# Patient Record
Sex: Female | Born: 1965 | Race: White | Hispanic: No | Marital: Married | Smoking: Former smoker
Health system: Southern US, Community
[De-identification: ages and names within clinical notes are randomized; demographics above are authoritative.]

## PROBLEM LIST (undated history)

## (undated) DIAGNOSIS — I10 Essential (primary) hypertension: Secondary | ICD-10-CM

## (undated) DIAGNOSIS — K219 Gastro-esophageal reflux disease without esophagitis: Secondary | ICD-10-CM

## (undated) DIAGNOSIS — F17200 Nicotine dependence, unspecified, uncomplicated: Secondary | ICD-10-CM

## (undated) DIAGNOSIS — T7840XA Allergy, unspecified, initial encounter: Secondary | ICD-10-CM

## (undated) DIAGNOSIS — K635 Polyp of colon: Secondary | ICD-10-CM

## (undated) DIAGNOSIS — E785 Hyperlipidemia, unspecified: Secondary | ICD-10-CM

## (undated) DIAGNOSIS — F32A Depression, unspecified: Secondary | ICD-10-CM

## (undated) DIAGNOSIS — F329 Major depressive disorder, single episode, unspecified: Secondary | ICD-10-CM

## (undated) HISTORY — DX: Major depressive disorder, single episode, unspecified: F32.9

## (undated) HISTORY — DX: Depression, unspecified: F32.A

## (undated) HISTORY — PX: GANGLION CYST EXCISION: SHX1691

## (undated) HISTORY — PX: BUNIONECTOMY: SHX129

## (undated) HISTORY — DX: Essential (primary) hypertension: I10

## (undated) HISTORY — DX: Hyperlipidemia, unspecified: E78.5

## (undated) HISTORY — DX: Polyp of colon: K63.5

## (undated) HISTORY — DX: Gastro-esophageal reflux disease without esophagitis: K21.9

## (undated) HISTORY — DX: Nicotine dependence, unspecified, uncomplicated: F17.200

## (undated) HISTORY — DX: Allergy, unspecified, initial encounter: T78.40XA

---

## 2003-06-29 ENCOUNTER — Inpatient Hospital Stay (HOSPITAL_COMMUNITY): Admission: AD | Admit: 2003-06-29 | Discharge: 2003-07-05 | Payer: Self-pay | Admitting: Obstetrics & Gynecology

## 2003-07-02 ENCOUNTER — Encounter (INDEPENDENT_AMBULATORY_CARE_PROVIDER_SITE_OTHER): Payer: Self-pay | Admitting: Specialist

## 2003-08-02 ENCOUNTER — Other Ambulatory Visit: Admission: RE | Admit: 2003-08-02 | Discharge: 2003-08-02 | Payer: Self-pay | Admitting: Obstetrics and Gynecology

## 2004-08-06 ENCOUNTER — Other Ambulatory Visit: Admission: RE | Admit: 2004-08-06 | Discharge: 2004-08-06 | Payer: Self-pay | Admitting: Obstetrics and Gynecology

## 2006-04-25 ENCOUNTER — Ambulatory Visit: Payer: Self-pay | Admitting: Family Medicine

## 2006-06-19 ENCOUNTER — Ambulatory Visit: Payer: Self-pay | Admitting: Family Medicine

## 2007-06-29 ENCOUNTER — Ambulatory Visit: Payer: Self-pay | Admitting: Family Medicine

## 2007-10-12 ENCOUNTER — Ambulatory Visit: Payer: Self-pay | Admitting: Family Medicine

## 2007-10-16 ENCOUNTER — Encounter: Admission: RE | Admit: 2007-10-16 | Discharge: 2007-10-16 | Payer: Self-pay | Admitting: Family Medicine

## 2008-04-25 ENCOUNTER — Ambulatory Visit: Payer: Self-pay | Admitting: Family Medicine

## 2008-04-28 ENCOUNTER — Encounter: Admission: RE | Admit: 2008-04-28 | Discharge: 2008-04-28 | Payer: Self-pay | Admitting: Family Medicine

## 2008-07-22 HISTORY — PX: INCONTINENCE SURGERY: SHX676

## 2008-07-22 HISTORY — PX: ABDOMINAL HYSTERECTOMY: SHX81

## 2008-07-22 HISTORY — PX: TOTAL VAGINAL HYSTERECTOMY: SHX2548

## 2008-11-16 ENCOUNTER — Ambulatory Visit: Payer: Self-pay | Admitting: Family Medicine

## 2008-12-14 ENCOUNTER — Inpatient Hospital Stay (HOSPITAL_COMMUNITY): Admission: RE | Admit: 2008-12-14 | Discharge: 2008-12-15 | Payer: Self-pay | Admitting: Obstetrics and Gynecology

## 2008-12-14 ENCOUNTER — Encounter (INDEPENDENT_AMBULATORY_CARE_PROVIDER_SITE_OTHER): Payer: Self-pay | Admitting: Obstetrics and Gynecology

## 2008-12-18 ENCOUNTER — Inpatient Hospital Stay (HOSPITAL_COMMUNITY): Admission: AD | Admit: 2008-12-18 | Discharge: 2008-12-18 | Payer: Self-pay | Admitting: Obstetrics & Gynecology

## 2009-11-20 ENCOUNTER — Ambulatory Visit: Payer: Self-pay | Admitting: Family Medicine

## 2009-11-23 ENCOUNTER — Encounter: Admission: RE | Admit: 2009-11-23 | Discharge: 2009-11-23 | Payer: Self-pay | Admitting: Family Medicine

## 2009-11-23 LAB — HM PAP SMEAR: HM Pap smear: NORMAL

## 2009-12-04 ENCOUNTER — Encounter: Admission: RE | Admit: 2009-12-04 | Discharge: 2009-12-04 | Payer: Self-pay | Admitting: Obstetrics and Gynecology

## 2010-02-01 ENCOUNTER — Ambulatory Visit: Payer: Self-pay | Admitting: Family Medicine

## 2010-06-07 ENCOUNTER — Ambulatory Visit: Payer: Self-pay | Admitting: Physician Assistant

## 2010-08-06 ENCOUNTER — Ambulatory Visit
Admission: RE | Admit: 2010-08-06 | Discharge: 2010-08-06 | Payer: Self-pay | Source: Home / Self Care | Attending: Family Medicine | Admitting: Family Medicine

## 2010-08-12 ENCOUNTER — Encounter: Payer: Self-pay | Admitting: Obstetrics and Gynecology

## 2010-09-03 ENCOUNTER — Ambulatory Visit: Payer: Self-pay | Admitting: Family Medicine

## 2010-10-30 LAB — COMPREHENSIVE METABOLIC PANEL
ALT: 13 U/L (ref 0–35)
AST: 26 U/L (ref 0–37)
Albumin: 3.2 g/dL — ABNORMAL LOW (ref 3.5–5.2)
Alkaline Phosphatase: 49 U/L (ref 39–117)
BUN: 5 mg/dL — ABNORMAL LOW (ref 6–23)
CO2: 24 mEq/L (ref 19–32)
Calcium: 8.8 mg/dL (ref 8.4–10.5)
Chloride: 109 mEq/L (ref 96–112)
Creatinine, Ser: 0.79 mg/dL (ref 0.4–1.2)
GFR calc Af Amer: >60 mL/min (ref >60)
GFR calc non Af Amer: >60 mL/min (ref >60)
Glucose, Bld: 94 mg/dL (ref 70–99)
Potassium: 3.8 mEq/L (ref 3.5–5.1)
Sodium: 140 mEq/L (ref 135–145)
Total Bilirubin: 0.9 mg/dL (ref 0.3–1.2)
Total Protein: 5.6 g/dL — ABNORMAL LOW (ref 6.0–8.3)

## 2010-10-30 LAB — PREGNANCY, URINE: Preg Test, Ur: NEGATIVE

## 2010-10-30 LAB — BASIC METABOLIC PANEL
BUN: 8 mg/dL (ref 6–23)
CO2: 28 mEq/L (ref 19–32)
Calcium: 9.1 mg/dL (ref 8.4–10.5)
Chloride: 104 mEq/L (ref 96–112)
Creatinine, Ser: 0.87 mg/dL (ref 0.4–1.2)
GFR calc Af Amer: >60 mL/min (ref >60)
GFR calc non Af Amer: >60 mL/min (ref >60)
Glucose, Bld: 90 mg/dL (ref 70–99)
Potassium: 4 mEq/L (ref 3.5–5.1)
Sodium: 137 mEq/L (ref 135–145)

## 2010-10-30 LAB — CBC
HCT: 36.6 % (ref 36.0–46.0)
HCT: 39.9 % (ref 36.0–46.0)
HCT: 43.6 % (ref 36.0–46.0)
Hemoglobin: 12.8 g/dL (ref 12.0–15.0)
Hemoglobin: 13.9 g/dL (ref 12.0–15.0)
Hemoglobin: 15.4 g/dL — ABNORMAL HIGH (ref 12.0–15.0)
MCHC: 34.8 g/dL (ref 30.0–36.0)
MCHC: 35 g/dL (ref 30.0–36.0)
MCHC: 35.3 g/dL (ref 30.0–36.0)
MCV: 89.6 fL (ref 78.0–100.0)
MCV: 90.4 fL (ref 78.0–100.0)
MCV: 90.7 fL (ref 78.0–100.0)
Platelets: 183 10*3/uL (ref 150–400)
Platelets: 202 10*3/uL (ref 150–400)
Platelets: 202 10*3/uL (ref 150–400)
RBC: 4.05 MIL/uL (ref 3.87–5.11)
RBC: 4.4 MIL/uL (ref 3.87–5.11)
RBC: 4.86 MIL/uL (ref 3.87–5.11)
RDW: 12.8 % (ref 11.5–15.5)
RDW: 13.2 % (ref 11.5–15.5)
RDW: 13.3 % (ref 11.5–15.5)
WBC: 19.4 10*3/uL — ABNORMAL HIGH (ref 4.0–10.5)
WBC: 20.7 10*3/uL — ABNORMAL HIGH (ref 4.0–10.5)
WBC: 9.1 10*3/uL (ref 4.0–10.5)

## 2010-12-04 NOTE — Op Note (Signed)
Sherry Huff, Sherry Huff          ACCOUNT NO.:  [id number]   MEDICAL RECORD NO.:  [id number]          PATIENT TYPE:  INP   LOCATION:  9315                          FACILITY:  [facility]   PHYSICIAN:  Carrington Clamp, M.D. 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   PROCEDURE:  Laparoscopic-assisted vaginal hysterectomy with anterior  repair.  The TOT and cystoscopy portions of the procedure are being  dictated by Dr. Conley Simmonds under separate heading.   SURGEON:  Dr. Henderson Cloud and Dr. Edward Jolly.   ASSISTANTS:  Dr. Henderson Cloud and Dr. Edward Jolly.   ANESTHESIA:  General.   FINDINGS:  Eight-week size uterus, normal ovaries, and tubes otherwise  seen.  Omental adhesions to just around the umbilicus.   SPECIMENS:  Uterus and cervix.   DISPOSITION:  To pathology.   ESTIMATED BLOOD LOSS:  750 mL.   IV FLUIDS:  2500 mL.   URINE OUTPUT:  Not measured.   COMPLICATIONS:  None.   MEDICATIONS:  One percent Xylocaine with epi 1:2, Surgicel.   COUNTS:  Correct x3.   TECHNIQUE:  After adequate general anesthesia was achieved, the patient  was prepped and draped in the usual sterile fashion in dorsal lithotomy  position.  A red rubber catheter was used to drain the bladder during  the laparoscopic portion of the procedure and a speculum placed in the  vagina.  A uterine manipulator was placed on the cervix and speculum  removed.  Attention was then turned to the abdomen, where a 2 cm  infraumbilical incision was made with a scalpel.  Using traction with an  Allis, the Veress needle was then inserted into the abdomen without  aspiration of bowel contents and blood.  The abdomen was insufflated and  the 10-mm trocar placed inside  the abdomen without complication.   Two 5-mm trocars were placed lateral to the pubic symphysis and slightly  superior.  The trocars were placed under direct visualization of the  camera.   The above findings were noted and dissection began with the gyrus  instrument on both of the round ligaments.  Each of the round ligaments  were secured with the gyrus cautery.  Dissection then curved across the  mesosalpinx into the uterine ovarian ligament.  The pedicle on each side  was double cauterized.  The broad ligament was then dissected  bilaterally with the gyrus instrument close to the uterus both  cauterizing and cutting at the same time.  The uterine arteries were not  taken at this time this dissection stopped just above that.  Bladder  flap was begun with sharp dissection with the scissors.  Once we  achieved a reasonable plane even though we did not get down to the  vagina, all instruments in the abdomen was desufflated and attention was  turned to the vagina.   Weighted speculum was placed in the vagina and the Deaver  as well.  The  cervix was grasped with a pair of Lahey's.  The cervix was injected with  0.5% Xylocaine with epi.  A circumferential incision was made with the  scalpel around this cervix at the level of the reflection of the vagina  onto the cervix.  Mayo scissors were used to enter into the posterior  cul-de-sac without complication.  The long duckbill retractor was then  placed and attention was turned to the anterior cervix, where dissection  of the bladder off the cervix was begun with the Metzenbaum.  The  Heaney's were placed bilaterally on the uterosacrals and each pedicle  was incised with Mayo scissors and secured with a Heaney stitch of 0  Vicryl.  The cardinal ligament was then divided with alternating  successive bites of the Heaney clamp.  Each pedicle was incised with the  Mayo scissors and secured with a stitch of 0 Vicryl.  Attention was  turned then  to anterior peritoneum, where sharp dissection with the  Metzenbaum scissors was continued until the reflection of the anterior  peritoneum could be identified.  This was incised and the bladder was  retracted with a anterior plate.  We continued dissection with  alternating successive bites of the Heaney clamp was performed until we  reached the prior gyrus incision site from the superior portion of the  uterus.   The uterus was then removed and handed to pathology.  I was unable to  achieve the anterior peritoneum and bring it through the vaginal  incision, as it has been pushed way up during the laparoscopic portion  of the case, so a pursestring suture stitch was then performed with the  uterosacrals through the cul-de-sac of the vagina back in from the  vagina into the cul-de-sac with a modified Halban's, and the same  procedure performed on the opposite side in the opposite order.  This  was then cinched down for a pursestring to close to measure the  peritoneum as possible.   The anterior vaginal mucosa was then grasped with Allis clamps in a  linear configuration.  Each was injected with 0.5% Xylocaine with epi  and then the scalpel was used to incise the midline.  The edges of the  vagina were grasped with Allis clamps and dissection with the Metzenbaum  was used bilaterally to retract the vesicouterine vaginal fascia off the  vaginal mucosa.  Once we got to the point where there was enough  dissection including mid urethra, where Dr. Edward Jolly plan to put the sling  in.  She took over the procedure and proceeded to do the TOT and the  cystoscopy.  Once those were successfully completed, I finished the  operation by closing the vesicovaginal fascia with mattress stitches  with 2-0 Vicryl mattress stitches.  The vaginal mucosa was then trimmed  and the vaginal mucosa closed with the running lock stitch of 2-0  Vicryl.  The cuff was then closed with 3 figure-of-eight stitches of 0   Vicryl.  Vaginal packing was placed and the Foley had been inserted  during Dr. Rica Records portion of the procedure and this was left in.   After changing gloves, attention was then turned to the abdomen, where  the abdomen was insufflated again and the scope placed back inside the  peritoneal cavity.  A small amount of oozing was noted at the edges of  the peritoneum, but all the pedicles were otherwise dry.  A small piece  of Surgicel was placed  at the cuff closure.   We had noted on entry into the abdomen that there had been some  adhesions around the 10-mm trocar site.  We then placed a 5-mm trocar  and to identify what these adhesions were and there were clearly omentum  and filmy to just below the trocar site.  There was no bleeding and  because the patient had complained of any pain and we had no good reason  for operating on this, we left the adhesions in situ and once we left  the adhesions in situ, once we identified that it was omentum only and  there was no bowel in that area.  All instruments were then withdrawn  from the abdomen.  The abdomen was desufflated.  The 10-mm trocar site  was closed with a figure-of-eight stitch of 2-0 Vicryl.  The skin  incisions of the 5 mL trocar sites were closed with a through-and-  through stitch of 3-0 Vicryl Rapide.  The incisions were closed with  Dermabond.  The patient tolerated the procedure well and returned to  recovery room in stable condition.      Carrington Clamp, M.D.  Electronically Signed     MH/MEDQ  D:  12/14/2008  T:  12/15/2008  Job:  [id number]

## 2010-12-04 NOTE — Op Note (Signed)
[name], Sherry Huff          ACCOUNT NO.:  [id number]   MEDICAL RECORD NO.:  [id number]          PATIENT TYPE:  AMB   LOCATION:  SDC                           FACILITY:  [facility]   PHYSICIAN:  Randye Lobo, M.D.   DATE OF BIRTH:  Apr 24, 1966   DATE OF PROCEDURE:  12/14/2008  DATE OF DISCHARGE:                               OPERATIVE REPORT   PREOPERATIVE DIAGNOSIS:  Genuine stress incontinence.   POSTOPERATIVE DIAGNOSIS:  Genuine stress incontinence.   PROCEDURE:  Monarc transobturator mid urethral sling and cystoscopy.   SURGEON:  Randye Lobo, MD   ASSISTANT:  Carrington Clamp, MD   ANESTHESIA:  General endotracheal.   ESTIMATED BLOOD LOSS:  75 mL.   URINE OUTPUT:  Quantity sufficient.   COMPLICATIONS:  None.   INDICATIONS FOR PROCEDURE:  The patient is a 45 year old, para 2,  Caucasian female who is seen for and cared for by her primary  gynecologist, Dr. Carrington Clamp, who has diagnosed the patient with  genuine stress incontinence.  The patient has been planning a  hysterectomy procedure and would like treatment of her urinary  incontinence at the same surgical setting.  The patient has had some  voiding dysfunction, and on her uroflowmetry study that demonstrated a  large postvoid residual.  The pressure flow study could not be completed  as the patient was unable to void with the transurethral catheter in  place.  A plan is made at this time to therefore proceed with a  transobturator sling and cystoscopy after risks, benefits, and  alternatives have been reviewed.   FINDINGS:  Examination under anesthesia revealed a second-degree  cystocele.  The cystoscopy demonstrated the bladder to be without a  foreign body.  There was no evidence of foreign body in the urethra.  The bladder was visualized throughout 306 degrees including the bladder  dome and trigone.  The ureters were patent bilaterally.   PROCEDURE:  The patient was reidentified in the preoperative  hold area.  She did receive Ancef 1 g IV for antibiotic prophylaxis.  The patient  received TED hose and PAS stockings for DVT prophylaxis.   In the operating room, general endotracheal anesthesia was induced, and  the patient was placed in the dorsal lithotomy position.  The patient  was sterilely prepped and draped.  Dr. Henderson Cloud proceeded at this time  with a laparoscopically-assisted vaginal hysterectomy.  Please refer to  this dictation separately.  At the termination of the procedure, the  vaginal cuff was still opened, and Dr. Henderson Cloud had placed a culdoplasty  suture.  Hemostasis was noted to be adequate.  Dr. Henderson Cloud then did  proceed with opening the anterior vaginal mucosa and performing a  dissection for the anterior colporrhaphy.  Again, hemostasis was good.   A Foley catheter was then placed at this time.  I then proceeded with a  transobturator sling.  I identified the adductor longus tendons  bilaterally.  I then marked 1-cm incisions at the lateral margins of the  pubic rami just below the level of the clitoris.  Incisions then were  created.  The transobturator sling was  performed without difficulty.  The transobturator needle was brought through the skin in the crural  fold on the patient's left-hand side, through the transobturator  membrane and muscle, and out through the endopelvic fascia and the  vagina at the level of the mid urethra and lateral to it on this  ipsilateral side.  The same procedure that was performed on the left-  hand side was then repeated on the right-hand side.   The Foley catheter was removed at this time, and cystoscopy was  performed after the injection of indigo carmine dye IV.  The findings  are as noted above.   The Foley catheter was then replaced, and the bladder was completely  drained of all cystoscopy fluid.  The sling was attached to the  transobturator needles and was brought out through the crural fold  incisions.  The plastic  sheath was then cut from the needles.  A Kelly  clamp was placed between the urethra, and the sling and the plastic  sheaths were removed.  Excess sling was then trimmed at the level of the  crural folds.   I placed one figure-of-eight suture of 2-0 Vicryl near the exit site of  the sling in the vagina on the patient's left-hand side to create good  hemostasis.   Dr. Henderson Cloud proceeded with the anterior colporrhaphy at this time and  closed the vagina.   I then closed the thigh incisions with subcuticular sutures of 3-0  Vicryl.  Dr. Henderson Cloud placed a vaginal packing with estrogen cream and  performed final laparoscopy.  Again, please refer to this dictation  separately.   This concluded the patient's procedure.  There were no complications.  All needle, instrument, and sponge counts were correct.      Randye Lobo, M.D.  Electronically Signed     BES/MEDQ  D:  12/14/2008  T:  12/15/2008  Job:  [id number]

## 2010-12-07 NOTE — Discharge Summary (Signed)
NAME:  Sherry Huff, Sherry Huff                      ACCOUNT NO.:  [id number]   MEDICAL RECORD NO.:  [id number]                   PATIENT TYPE:  INP   LOCATION:  9304                                 FACILITY:  [facility]   PHYSICIAN:  Ilda Mori, M.D.                DATE OF BIRTH:  07/19/1966   DATE OF ADMISSION:  06/29/2003  DATE OF DISCHARGE:  07/05/2003                                 DISCHARGE SUMMARY   FINAL DIAGNOSES:  1. Intrauterine pregnancy at 33-1/[redacted] weeks gestation.  2. Severe preeclampsia.  3. History of previous cesarean section and desires repeat cesarean section.   PROCEDURE:  Repeat low transverse cesarean section.  Surgeon Dr. Carrington Clamp.  Assistant Dr. Conley Simmonds.  Complications none.   HOSPITAL COURSE:  This 45 year old G2, P1-0-0-1 was admitted at 33-1/[redacted] weeks  gestation secondary to elevated blood pressures.  Patient has a history of  chronic hypertension and was not on any medication before her admission.  Her antepartum course had been complicated by advanced maternal age.  She  did have an amniocentesis performed which showed 61, XY karyotype.  Patient  also had a history of a cesarean section and of course her history of  hypertension that was nonmedicated.  She started having some elevated blood  pressures in the third trimester on nonstress test were reactive.  On  June 29, 2003 the patient was admitted, her blood pressures in the office  had gotten as high as 170/100 and she was admitted for labs/24-hour urine.  The 24-hour urine came back with 1400 mg of protein.  Patient did receive  betamethasone protocol.  At this point it was discussed with the patient to  proceed with a cesarean section secondary to severe preeclampsia.  In the  hospital patient was started on Procardia for her blood pressures.  All labs  were obtained.  Patient's LFTs did remain normal.  She had a BPP performed  which was 8/10 with a normal AFI.  Patient was on magnesium sulfate  for  prophylaxis.  She was taken to the operating room on July 02, 2003 where  Dr. Henderson Cloud performed a repeat low transverse cesarean section with the  delivery of a 5 pound 0 ounce female infant with Apgars of 8 and 9, the  delivery went without complication, patient was kept on magnesium sulfate  for over 24 hours, baby was in the NICU doing well, blood pressure started  to decline after delivery, she was not having any problems, and she was felt  ready for discharge on postoperative day #3.  Patient was sent home on a  regular diet, told to decrease activities, told to continue prenatal  vitamins, was given Tylox (#25) one every 4 hours as needed for pain, was  told to follow up in the office in 1 week for a blood pressure check or of  course to call with any increased fever, pain, or  problem.   LABORATORIES ON DISCHARGE:  Patient had a hemoglobin of 11.4, white blood  cell count of 8.9, and liver function tests and labs all remained normal.     Leilani Able, P.A.-C.                Ilda Mori, M.D.    MB/MEDQ  D:  08/01/2003  T:  08/01/2003  Job:  [id number]

## 2010-12-07 NOTE — Op Note (Signed)
NAME:  Sherry Huff, Sherry Huff                      ACCOUNT NO.:  [id number]   MEDICAL RECORD NO.:  [id number]                   PATIENT TYPE:  INP   LOCATION:  9372                                 FACILITY:  [facility]   PHYSICIAN:  Carrington Clamp, M.D.              DATE OF BIRTH:  1965-12-08   DATE OF PROCEDURE:  07/02/2003  DATE OF DISCHARGE:                                 OPERATIVE REPORT   PREOPERATIVE DIAGNOSES:  1. Severe pre-eclampsia at 33-1/2 weeks.  2. Previous cesarean section, desires repeat.   POSTOPERATIVE DIAGNOSES:  1. Severe pre-eclampsia at 33-1/2 weeks.  2. Previous cesarean section, desires repeat.   PROCEDURE:  Low-transverse cesarean section.   SURGEON:  Loney Laurence, M.D.   ASSISTANT:  Randye Lobo, M.D.   ANESTHESIA:  Spinal.   ESTIMATED BLOOD LOSS:  600 mL.   INTRAVENOUS FLUIDS:  500 mL.   URINE OUTPUT:  150 mL.   COMPLICATIONS:  None.   FINDINGS:  Female infant, vertex presentation.  APGARS 8/ 9.  Weight 5 pounds.  There were normal tubes, ovaries, and uterus seen.   MEDICATIONS:  Ancef and Pitocin.   PATHOLOGY:  Placenta.   COUNTS:  Correct x3.   REASON FOR OPERATION:  [name] is a 45 year old G2, P1-0-0-1,  who was admitted at 33-1/7 weeks secondary to elevated blood pressures.  The  patient has a history of chronic hypertension but was not on any medications  before admission.  Her blood pressures in the office, however, have been  170s/100s and there was protein in her urine.  Her blood pressures remained  150-170s/90-100s despite Procardia and labetalol.  The patient's 24 hour  urine came back 1400 mg of protein.  Discussed with patient the risks of  continued pregnancy versus the benefits of waiting for the baby to be a  little bit older.  The patient had received two doses of betamethasone 24  hours before the surgery.  It was decided secondary to the severe pre-  eclampsia that we would proceed with a repeat cesarean  section 24 hours  after the second dose of betamethasone.  All risks, benefits, and  alternatives have been discussed with the patient and her newlywed husband.   DESCRIPTION OF PROCEDURE:  After adequate spinal anesthesia was achieved,  the patient was prepped and draped in the usual fashion in the dorsal supine  position with a leftward tilt.   A Pfannenstiel skin incision was made with the scalpel and carried down to  the fascia with the Bovie cautery.  The fascia was incised in the midline  with the scalpel and then carried in a transverse curvilinear manner with  the Mayo scissors.  The fascia was reflected superiorly and inferiorly from  the rectus muscles with the Bovie cautery and the Mayo scissors and rectus  muscle split in the midline.  The bowel free portion of the peritoneum was  then entered into  bluntly and the peritoneum incised in a superior and  inferior manner with the Metzenbaum scissors.  The bladder blade was then  replaced and the vesicouterine fascia tented up and incised in a transverse  curvilinear manner and the bladder flap created bluntly.  The bladder blade  was replaced and a 2 cm incision was made transversely in the upper portion  of the lower uterine segment.   Clear fluid was noted upon entry into the amnion and the incision extended  in a transverse curvilinear manner with the bandage scissors.  The baby was  identified in the vertex presentation and delivered without complication.  The baby was bulb suctioned and the cord was clamped and cut and the baby  was handed to the awaiting pediatrics.  The placenta was then removed  manually and the uterus was exteriorized, wrapped in wet lap, and cleared of  all debris.   The bladder blade was replaced and the incision was closed with a running,  locked stitch of #0 Monocryl.  An imbricating layer was used to treat  hemostasis; this also of #0 Monocryl.  The uterus was reapproximated in the  abdominal  cavity and the gutters cleared of all debris with irrigation.  The  uterine incision was reinspected and found to be hemostatic and the  peritoneum was then closed with a running stitch of 2-0 Vicryl.  The fascia  was closed with a running stitch of #0 Vicryl.  The subcutaneous tissue was  rendered hemostatic with the Bovie cautery and irrigation and then closed  with interrupted stitches of 2-0 plain gut.  The skin was closed with  staples.   The patient tolerated the procedure well and was returned to the recovery  room in stable condition.                                               Carrington Clamp, M.D.    MH/MEDQ  D:  07/02/2003  T:  07/02/2003  Job:  [id number]

## 2010-12-12 ENCOUNTER — Other Ambulatory Visit: Payer: Self-pay | Admitting: Family Medicine

## 2010-12-12 NOTE — Telephone Encounter (Signed)
Called pharmacy lipitor 40  Mg # 30 0 refill pt needs med check

## 2010-12-12 NOTE — Telephone Encounter (Signed)
Called pharmacy for 30 no refill pt needs med check

## 2011-02-01 ENCOUNTER — Other Ambulatory Visit: Payer: Self-pay

## 2011-02-01 MED ORDER — BENAZEPRIL HCL 20 MG PO TABS: 20.0000 mg | ORAL_TABLET | Freq: Every day | ORAL | Status: DC

## 2011-02-08 ENCOUNTER — Ambulatory Visit (INDEPENDENT_AMBULATORY_CARE_PROVIDER_SITE_OTHER): Payer: BC Managed Care – PPO | Admitting: Medical

## 2011-02-08 ENCOUNTER — Encounter: Payer: Self-pay | Admitting: Medical

## 2011-02-08 VITALS — BP 130/86 | HR 79 | Ht 64.0 in | Wt 192.0 lb

## 2011-02-08 DIAGNOSIS — F17200 Nicotine dependence, unspecified, uncomplicated: Secondary | ICD-10-CM

## 2011-02-08 DIAGNOSIS — Z0000 Encounter for general adult medical examination without abnormal findings: Secondary | ICD-10-CM

## 2011-02-08 DIAGNOSIS — E785 Hyperlipidemia, unspecified: Secondary | ICD-10-CM

## 2011-02-08 LAB — LIPID PANEL
Cholesterol: 217 mg/dL — ABNORMAL HIGH (ref 0–200)
HDL: 35 mg/dL — ABNORMAL LOW (ref >39)
LDL Cholesterol: 150 mg/dL — ABNORMAL HIGH (ref 0–99)
Total CHOL/HDL Ratio: 6.2 Ratio
Triglycerides: 162 mg/dL — ABNORMAL HIGH (ref <150)
VLDL: 32 mg/dL (ref 0–40)

## 2011-02-08 LAB — POCT URINALYSIS DIPSTICK
Bilirubin, UA: NEGATIVE
Blood, UA: NEGATIVE
Glucose, UA: NEGATIVE
Ketones, UA: NEGATIVE
Leukocytes, UA: NEGATIVE
Nitrite, UA: NEGATIVE
Protein, UA: NEGATIVE
Spec Grav, UA: 1.01
Urobilinogen, UA: NEGATIVE
pH, UA: 5

## 2011-02-08 LAB — CBC WITH DIFFERENTIAL/PLATELET
Basophils Absolute: 0 10*3/uL (ref 0.0–0.1)
Basophils Relative: 0 % (ref 0–1)
Eosinophils Absolute: 0.3 10*3/uL (ref 0.0–0.7)
Eosinophils Relative: 3 % (ref 0–5)
HCT: 49.3 % — ABNORMAL HIGH (ref 36.0–46.0)
Hemoglobin: 16.3 g/dL — ABNORMAL HIGH (ref 12.0–15.0)
Lymphocytes Relative: 33 % (ref 12–46)
Lymphs Abs: 3.2 10*3/uL (ref 0.7–4.0)
MCH: 30.5 pg (ref 26.0–34.0)
MCHC: 33.1 g/dL (ref 30.0–36.0)
MCV: 92.1 fL (ref 78.0–100.0)
Monocytes Absolute: 0.6 10*3/uL (ref 0.1–1.0)
Monocytes Relative: 6 % (ref 3–12)
Neutro Abs: 5.6 10*3/uL (ref 1.7–7.7)
Neutrophils Relative %: 58 % (ref 43–77)
Platelets: 255 10*3/uL (ref 150–400)
RBC: 5.35 MIL/uL — ABNORMAL HIGH (ref 3.87–5.11)
RDW: 13.5 % (ref 11.5–15.5)
WBC: 9.7 10*3/uL (ref 4.0–10.5)

## 2011-02-08 LAB — COMPREHENSIVE METABOLIC PANEL
ALT: 13 U/L (ref 0–35)
AST: 13 U/L (ref 0–37)
Albumin: 4.7 g/dL (ref 3.5–5.2)
Alkaline Phosphatase: 62 U/L (ref 39–117)
BUN: 13 mg/dL (ref 6–23)
CO2: 26 mEq/L (ref 19–32)
Calcium: 9.9 mg/dL (ref 8.4–10.5)
Chloride: 104 mEq/L (ref 96–112)
Creat: 0.92 mg/dL (ref 0.50–1.10)
Glucose, Bld: 97 mg/dL (ref 70–99)
Potassium: 4.2 mEq/L (ref 3.5–5.3)
Sodium: 140 mEq/L (ref 135–145)
Total Bilirubin: 0.5 mg/dL (ref 0.3–1.2)
Total Protein: 7.1 g/dL (ref 6.0–8.3)

## 2011-02-08 LAB — TSH: TSH: 2.248 u[IU]/mL (ref 0.350–4.500)

## 2011-02-08 MED ORDER — BENAZEPRIL HCL 20 MG PO TABS: 20.0000 mg | ORAL_TABLET | Freq: Every day | ORAL | Status: DC

## 2011-02-08 MED ORDER — ATORVASTATIN CALCIUM 40 MG PO TABS: 40.0000 mg | ORAL_TABLET | Freq: Every day | ORAL | Status: DC

## 2011-02-08 MED ORDER — NIACIN ER (ANTIHYPERLIPIDEMIC) 500 MG PO TBCR: 500.0000 mg | EXTENDED_RELEASE_TABLET | Freq: Every day | ORAL | Status: DC

## 2011-02-08 NOTE — Progress Notes (Signed)
Subjective:   HPI  Sherry Huff is a 45 y.o. female who presents for a complete physical.  No recent c/o.  She does note some stress as she will lose her job next year after 21 years.  She has started back smoking after learning of this news.  otherwise she has been in her usual state of health, taking her meds as usual, but ran out of cholesterol meds a week ago.  No other new c/o.   Reviewed their medical, surgical, family, social, medication, and allergy history and updated chart as appropriate.  Past Medical History  Diagnosis Date  . Hyperlipidemia   . Hypertension     Past Surgical History  Procedure Date  . Abdominal hysterectomy 2010    partial  . Cesarean section     x2  . Bunionectomy     left  . Ganglion cyst excision     left arm    Family History  Problem Relation Age of Onset  . Cancer Mother     died of leukemia  . Cancer Father     died of lung cancer  . Hypertension Father   . Depression Sister   . Dementia Sister   . Hypertension Brother   . Hyperlipidemia Brother   . Stroke Paternal Aunt   . Heart disease Paternal Uncle   . COPD Paternal Uncle     History   Social History  . Marital Status: Married    Spouse Name: N/A    Number of Children: N/A  . Years of Education: N/A   Occupational History  . Not on file.   Social History Main Topics  . Smoking status: Current Everyday Smoker -- 0.5 packs/day    Types: Cigarettes  . Smokeless tobacco: Never Used  . Alcohol Use: No  . Drug Use: No  . 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 Not on file   Social History Narrative  . No narrative on file    No current outpatient prescriptions on file prior to visit.    Allergies  Allergen Reactions  . Codeine   . Penicillins     Review of Systems Constitutional: denies fever, chills, sweats, unexpected weight change, anorexia, fatigue Allergy:  negative; denies recent sneezing, itching, congestion Dermatology: denies changing moles, rash, lumps, new worrisome lesions ENT: no runny nose, ear pain, sore throat, hoarseness, sinus pain, teeth pain, tinnitus, hearing loss, epistaxis Cardiology: denies chest pain, palpitations, edema, orthopnea, paroxysmal nocturnal dyspnea Respiratory: denies cough, shortness of breath, dyspnea on exertion, wheezing, hemoptysis Gastroenterology: denies abdominal pain, nausea, vomiting, diarrhea, constipation, blood in stool, changes in bowel movement, dysphagia Hematology: denies bleeding or bruising problems Musculoskeletal: denies arthralgias, myalgias, joint swelling, back pain, neck pain, cramping, gait changes Ophthalmology: denies vision changes, eye redness, itching, discharge Urology: denies dysuria, difficulty urinating, hematuria, urinary frequency, urgency, incontinence Neurology: no headache, weakness, tingling, numbness, speech abnormality, memory loss, falls, dizziness Psychology: denies depressed mood, agitation, sleep problems     Objective:   Physical Exam  Filed Vitals:   02/08/11 0845  BP: 130/86  Pulse: 79    General appearance: alert, no distress, WD/WN, white female , looks stated age HEENT: normocephalic, conjunctiva/corneas normal, sclerae anicteric, PERRLA, EOMi, nares patent, no discharge or erythema, pharynx normal Oral cavity: MMM, tongue normal, teeth with stain, but otherwise in good repair Neck: supple, no lymphadenopathy, no thyromegaly, no  masses, normal ROM, no bruits Chest: non tender, normal shape and expansion Heart: RRR, normal S1, S2, no murmurs Lungs: CTA bilaterally, no wheezes, rhonchi, or rales Abdomen: +bs, soft, non tender, non distended, no masses, no hepatomegaly, no splenomegaly, no bruits Back: non tender, normal ROM, no scoliosis Musculoskeletal: upper extremities non tender, no obvious deformity, normal ROM throughout, lower extremities non  tender, no obvious deformity, normal ROM throughout Extremities: no edema, no cyanosis, no clubbing Pulses: 2+ symmetric, upper and lower extremities, normal cap refill Neurological: alert, oriented x 3, CN2-12 intact, strength normal upper extremities and lower extremities, sensation normal throughout, DTRs 2+ throughout, no cerebellar signs, gait normal Psychiatric: normal affect, behavior normal, pleasant  Breast/pelvic - deferred to gynecology   Assessment :    Encounter Diagnoses  Name Primary?  . Physical exam, annual Yes  . Hyperlipidemia   . Tobacco use disorder       Plan:    Physical exam - discussed healthy lifestyle, diet, exercise, preventative care, vaccinations, and addressed their concerns.  Labs today.  Advised she begin exercise and eat healthier.  F/u with eye doctor soon for regular checkup, f/u with dental hygienist soon for cleaning, and f/u with OB/Gyn soon for mammogram and pelvic exam.   Hyperlipidemia - labs today.  Of note, she has been out of her medication x 1wk.  Tobacco use disorder - advised she stop tobacco.  She had quit prior.  Discusses risks of tobacco use.  Counseled on job hunting, stressors.

## 2011-02-11 ENCOUNTER — Telehealth: Payer: Self-pay | Admitting: *Deleted

## 2011-02-11 ENCOUNTER — Other Ambulatory Visit: Payer: Self-pay | Admitting: Medical

## 2011-02-11 MED ORDER — ATORVASTATIN CALCIUM 80 MG PO TABS: 80.0000 mg | ORAL_TABLET | Freq: Every day | ORAL | Status: DC

## 2011-02-11 NOTE — Telephone Encounter (Addendum)
Message copied by Dorthula Perfect on Mon Feb 11, 2011  8:02 AM ------      Message from: Jac Canavan      Created: Mon Feb 11, 2011  7:48 AM       Her hemoglobin and red blood cells are elevated.  This is likely due to tobacco use.  Recommend she try and quit tobacco completely.  Offer 1-800-QUIT-NOW hotline to help stop smoking.  Her cholesterol is too high.  I would like her to exercise daily for 30+minutes, and make sure she is eating low fat/low chol diet.  Stay away from lots of red meat, cheese, egg yolks, ice cream, etc.  If agreeable, I want to increase her Lipitor to 80mg  daily.  Her liver, kidney, electrolytes, urine, thyroid blood tests are ALL normal.  Lets recheck cholesterol in 3-63mo.   Pt advised of labs and also informed the quit smoking hotline.  Pt scheduled to return for a follow up on 06-03-11 at 8:30 am.  Pt agreed to increase Lipitor to 80 mg daily.  CM, LPN

## 2011-03-21 ENCOUNTER — Other Ambulatory Visit: Payer: Self-pay | Admitting: Obstetrics and Gynecology

## 2011-03-21 DIAGNOSIS — R928 Other abnormal and inconclusive findings on diagnostic imaging of breast: Secondary | ICD-10-CM

## 2011-03-23 LAB — HM MAMMOGRAPHY: HM Mammogram: NORMAL

## 2011-03-27 ENCOUNTER — Ambulatory Visit
Admission: RE | Admit: 2011-03-27 | Discharge: 2011-03-27 | Disposition: A | Discharge status: Home / Self Care | Payer: BC Managed Care – PPO | Source: Ambulatory Visit | Attending: Obstetrics and Gynecology | Admitting: Obstetrics and Gynecology

## 2011-03-27 DIAGNOSIS — R928 Other abnormal and inconclusive findings on diagnostic imaging of breast: Secondary | ICD-10-CM

## 2011-06-18 ENCOUNTER — Encounter: Payer: Self-pay | Admitting: Medical

## 2011-06-18 ENCOUNTER — Ambulatory Visit (INDEPENDENT_AMBULATORY_CARE_PROVIDER_SITE_OTHER): Payer: BC Managed Care – PPO | Admitting: Medical

## 2011-06-18 VITALS — BP 130/80 | HR 68 | Temp 98.3°F | Resp 16 | Wt 200.0 lb

## 2011-06-18 DIAGNOSIS — F17200 Nicotine dependence, unspecified, uncomplicated: Secondary | ICD-10-CM

## 2011-06-18 DIAGNOSIS — E785 Hyperlipidemia, unspecified: Secondary | ICD-10-CM

## 2011-06-18 DIAGNOSIS — Z79899 Other long term (current) drug therapy: Secondary | ICD-10-CM

## 2011-06-18 DIAGNOSIS — Z23 Encounter for immunization: Secondary | ICD-10-CM | POA: Insufficient documentation

## 2011-06-18 LAB — LIPID PANEL
Cholesterol: 135 mg/dL (ref 0–200)
HDL: 36 mg/dL — ABNORMAL LOW (ref >39)
LDL Cholesterol: 66 mg/dL (ref 0–99)
Total CHOL/HDL Ratio: 3.8 Ratio
Triglycerides: 163 mg/dL — ABNORMAL HIGH (ref <150)
VLDL: 33 mg/dL (ref 0–40)

## 2011-06-18 LAB — ALT: ALT: 17 U/L (ref 0–35)

## 2011-06-18 MED ORDER — VARENICLINE TARTRATE 0.5 MG PO TABS: 0.5000 mg | ORAL_TABLET | Freq: Two times a day (BID) | ORAL | Status: AC

## 2011-06-18 NOTE — Patient Instructions (Signed)
Call 1-800-QUIT-NOW to help with your efforts to stop smoking . Begin back on Chantix 1 tablet daily for 3-4 days, then 1 tablet twice daily.  Call me in 3-4 weeks to let me know how your are doing with this.  We will call with lab results.

## 2011-06-18 NOTE — Progress Notes (Signed)
Subjective:   HPI  Sherry Huff is a 45 y.o. female who presents for routine follow up.  I saw her back in July for a full physical, and at that time I increased her Lipitor to 80 mg for high cholesterol.  She has been compliant with medication, also taking Niaspan and Krill oil.  Denies medication side effects.  She had quit smoking for brief period but started back. She did well on Chantix in the past, still has some of this at home.  She is not exercising very much, otherwise no new complaints.  The following portions of the patient's history were reviewed and updated as appropriate: allergies, current medications, past family history, past medical history, past social history, past surgical history and problem list.  Review of Systems Constitutional: -fever, -chills, -sweats, -unexpected -weight change,-fatigue ENT: -runny nose, -ear pain, -sore throat Cardiology:  -chest pain, -palpitations, -edema Respiratory: -cough, -shortness of breath, -wheezing Gastroenterology: -abdominal pain, -nausea, -vomiting, -diarrhea, -constipation Hematology: -bleeding or bruising problems Musculoskeletal: -arthralgias, -myalgias, -joint swelling, -back pain Ophthalmology: -vision changes Urology: -dysuria, -difficulty urinating, -hematuria, -urinary frequency, -urgency Neurology: -headache, -weakness, -tingling, -numbness    Objective:   Physical Exam  Filed Vitals:   06/18/11 0824  BP: 130/80  Pulse: 68  Temp: 98.3 F (36.8 C)  Resp: 16    General appearance: alert, no distress, WD/WN, white female Neck: supple, no lymphadenopathy, no thyromegaly, no masses Heart: RRR, normal S1, S2, no murmurs Lungs: CTA bilaterally, no wheezes, rhonchi, or rales Abdomen: +bs, soft, non tender, non distended, no masses, no hepatomegaly, no splenomegaly Pulses: 2+ symmetric, upper and lower extremities, normal cap refill   Assessment and Plan :     Encounter Diagnoses  Name Primary?  .  Hyperlipidemia Yes  . Tobacco use disorder   . Encounter for long-term (current) use of other medications   . Need for prophylactic vaccination and inoculation against influenza    Hyperlipidemia-fasting labs today continue same medications.  Of note, on refill she'll need 90 day supply of her Lipitor.  Tobacco use disorder-she will restart Chantix she has a home by taking one tablet a day for a few days, then twice a day, set a quit date for tobacco within 2 weeks, and call the 1 800 quit now for counseling to help stop smoking completely. Discussed risk and dangers of smoking. I asked her to call me back in 3-4 weeks to let me know how she is doing.  Flu vaccine and vaccine counseling given  Follow-up pending labs.

## 2011-06-20 ENCOUNTER — Other Ambulatory Visit: Payer: Self-pay | Admitting: Family Medicine

## 2011-06-20 DIAGNOSIS — E785 Hyperlipidemia, unspecified: Secondary | ICD-10-CM

## 2011-06-20 MED ORDER — ATORVASTATIN CALCIUM 80 MG PO TABS: 80.0000 mg | ORAL_TABLET | Freq: Every day | ORAL | Status: DC

## 2012-05-05 ENCOUNTER — Other Ambulatory Visit: Payer: Self-pay | Admitting: Medical

## 2012-05-05 NOTE — Telephone Encounter (Signed)
PATIENT NEEDS A OFFICE VISIT BEFORE YOUR MEDICATION RUNS OUT. LAST OFFICE VISIT WAS LAST YEAR.

## 2012-08-26 ENCOUNTER — Telehealth: Payer: Self-pay | Admitting: Family Medicine

## 2012-08-26 DIAGNOSIS — E785 Hyperlipidemia, unspecified: Secondary | ICD-10-CM

## 2012-08-26 DIAGNOSIS — I10 Essential (primary) hypertension: Secondary | ICD-10-CM

## 2012-08-26 MED ORDER — BENAZEPRIL HCL 20 MG PO TABS: 20.0000 mg | ORAL_TABLET | Freq: Every day | ORAL | Status: DC

## 2012-08-26 MED ORDER — ATORVASTATIN CALCIUM 80 MG PO TABS: 80.0000 mg | ORAL_TABLET | Freq: Every day | ORAL | Status: DC

## 2012-08-26 NOTE — Telephone Encounter (Signed)
Done

## 2012-10-19 ENCOUNTER — Encounter: Payer: Self-pay | Admitting: Internal Medicine

## 2012-10-19 ENCOUNTER — Ambulatory Visit (INDEPENDENT_AMBULATORY_CARE_PROVIDER_SITE_OTHER): Payer: BC Managed Care – PPO | Admitting: Family Medicine

## 2012-10-19 ENCOUNTER — Encounter: Payer: Self-pay | Admitting: Family Medicine

## 2012-10-19 VITALS — BP 128/88 | HR 72 | Ht 63.0 in | Wt 178.0 lb

## 2012-10-19 DIAGNOSIS — I10 Essential (primary) hypertension: Secondary | ICD-10-CM | POA: Insufficient documentation

## 2012-10-19 DIAGNOSIS — F17200 Nicotine dependence, unspecified, uncomplicated: Secondary | ICD-10-CM

## 2012-10-19 DIAGNOSIS — R5383 Other fatigue: Secondary | ICD-10-CM

## 2012-10-19 DIAGNOSIS — E785 Hyperlipidemia, unspecified: Secondary | ICD-10-CM

## 2012-10-19 DIAGNOSIS — R5381 Other malaise: Secondary | ICD-10-CM

## 2012-10-19 DIAGNOSIS — Z79899 Other long term (current) drug therapy: Secondary | ICD-10-CM

## 2012-10-19 DIAGNOSIS — Z0000 Encounter for general adult medical examination without abnormal findings: Secondary | ICD-10-CM

## 2012-10-19 DIAGNOSIS — J069 Acute upper respiratory infection, unspecified: Secondary | ICD-10-CM

## 2012-10-19 DIAGNOSIS — Z23 Encounter for immunization: Secondary | ICD-10-CM

## 2012-10-19 LAB — COMPREHENSIVE METABOLIC PANEL
ALT: 13 U/L (ref 0–35)
AST: 16 U/L (ref 0–37)
Albumin: 4.6 g/dL (ref 3.5–5.2)
Alkaline Phosphatase: 81 U/L (ref 39–117)
BUN: 8 mg/dL (ref 6–23)
CO2: 27 mEq/L (ref 19–32)
Calcium: 9.8 mg/dL (ref 8.4–10.5)
Chloride: 104 mEq/L (ref 96–112)
Creat: 0.78 mg/dL (ref 0.50–1.10)
Glucose, Bld: 87 mg/dL (ref 70–99)
Potassium: 4 mEq/L (ref 3.5–5.3)
Sodium: 142 mEq/L (ref 135–145)
Total Bilirubin: 0.8 mg/dL (ref 0.3–1.2)
Total Protein: 6.9 g/dL (ref 6.0–8.3)

## 2012-10-19 LAB — CBC WITH DIFFERENTIAL/PLATELET
Basophils Absolute: 0 10*3/uL (ref 0.0–0.1)
Basophils Relative: 0 % (ref 0–1)
Eosinophils Absolute: 0.3 10*3/uL (ref 0.0–0.7)
Eosinophils Relative: 4 % (ref 0–5)
HCT: 45.9 % (ref 36.0–46.0)
Hemoglobin: 15.7 g/dL — ABNORMAL HIGH (ref 12.0–15.0)
Lymphocytes Relative: 36 % (ref 12–46)
Lymphs Abs: 3 10*3/uL (ref 0.7–4.0)
MCH: 29.6 pg (ref 26.0–34.0)
MCHC: 34.2 g/dL (ref 30.0–36.0)
MCV: 86.4 fL (ref 78.0–100.0)
Monocytes Absolute: 0.5 10*3/uL (ref 0.1–1.0)
Monocytes Relative: 6 % (ref 3–12)
Neutro Abs: 4.6 10*3/uL (ref 1.7–7.7)
Neutrophils Relative %: 54 % (ref 43–77)
Platelets: 270 10*3/uL (ref 150–400)
RBC: 5.31 MIL/uL — ABNORMAL HIGH (ref 3.87–5.11)
RDW: 12.9 % (ref 11.5–15.5)
WBC: 8.4 10*3/uL (ref 4.0–10.5)

## 2012-10-19 LAB — POCT URINALYSIS DIPSTICK
Bilirubin, UA: NEGATIVE
Glucose, UA: NEGATIVE
Ketones, UA: NEGATIVE
Leukocytes, UA: NEGATIVE
Nitrite, UA: NEGATIVE
Protein, UA: NEGATIVE
Spec Grav, UA: 1.01
Urobilinogen, UA: NEGATIVE
pH, UA: 6

## 2012-10-19 LAB — LIPID PANEL
Cholesterol: 115 mg/dL (ref 0–200)
HDL: 31 mg/dL — ABNORMAL LOW (ref >39)
LDL Cholesterol: 53 mg/dL (ref 0–99)
Total CHOL/HDL Ratio: 3.7 Ratio
Triglycerides: 155 mg/dL — ABNORMAL HIGH (ref <150)
VLDL: 31 mg/dL (ref 0–40)

## 2012-10-19 LAB — TSH: TSH: 1.888 u[IU]/mL (ref 0.350–4.500)

## 2012-10-19 MED ORDER — BENAZEPRIL HCL 20 MG PO TABS: 20.0000 mg | ORAL_TABLET | Freq: Every day | ORAL | Status: DC

## 2012-10-19 MED ORDER — VARENICLINE TARTRATE 0.5 MG X 11 & 1 MG X 42 PO MISC
ORAL | Status: DC
Start: 2012-10-19 — End: 2013-11-10

## 2012-10-19 MED ORDER — VARENICLINE TARTRATE 1 MG PO TABS: 1.0000 mg | ORAL_TABLET | Freq: Two times a day (BID) | ORAL | Status: DC

## 2012-10-19 NOTE — Patient Instructions (Signed)
HEALTH MAINTENANCE RECOMMENDATIONS:  It is recommended that you get at least 30 minutes of aerobic exercise at least 5 days/week (for weight loss, you may need as much as 60-90 minutes). This can be any activity that gets your heart rate up. This can be divided in 10-15 minute intervals if needed, but try and build up your endurance at least once a week.  Weight bearing exercise is also recommended twice weekly.  Eat a healthy diet with lots of vegetables, fruits and fiber.  "Colorful" foods have a lot of vitamins (ie green vegetables, tomatoes, red peppers, etc).  Limit sweet tea, regular sodas and alcoholic beverages, all of which has a lot of calories and sugar.  Up to 1 alcoholic drink daily may be beneficial for women (unless trying to lose weight, watch sugars).  Drink a lot of water.  Calcium recommendations are 1200-1500 mg daily (1500 mg for postmenopausal women or women without ovaries), and vitamin D 1000 IU daily.  This should be obtained from diet and/or supplements (vitamins), and calcium should not be taken all at once, but in divided doses.  Monthly self breast exams and yearly mammograms for women over the age of 12 is recommended.  Sunscreen of at least SPF 30 should be used on all sun-exposed parts of the skin when outside between the hours of 10 am and 4 pm (not just when at beach or pool, but even with exercise, golf, tennis, and yard work!)  Use a sunscreen that says "broad spectrum" so it covers both UVA and UVB rays, and make sure to reapply every 1-2 hours.  Remember to change the batteries in your smoke detectors when changing your clock times in the spring and fall.  Use your seat belt every time you are in a car, and please drive safely and not be distracted with cell phones and texting while driving.  Please try and quit smoking--start thinking about why/when you smoke (habit, boredom, stress) in order to come up with effective strategies to cut back or quit. Available  resources to help you quit include free counseling through [facility] ([facility] or 1-800-QUITNOW), smoking cessation classes through [facility] (call to find out schedule), over-the-counter nicotine replacements, and e-cigarettes (although this may not help break the hand-mouth habit).  Many insurance companies also have smoking cessation programs (which may decrease the cost of patches, meds if enrolled).  If these methods are not effective for you, and you are motivated to quit, return to discuss the possibility of prescription medications.   URI--recommend sinus rinses and/or mucinex.   Call if symptoms worsen (discolored mucus returns, fevers, worsening sinus pain). Smoking--restart Chantix.  Set quit date for 10-14 days after starting, and contact company for their free counseling. HTN--well controlled. Ideally diastolic should be a little lower.  Continue with low sodium diet and weight loss.  Exercise daily Hyperlipidemia--awaiting lab results to see if niaspan needs to be restarted.  Continue lipitor.  Will refill after labs reviewed.

## 2012-10-19 NOTE — Progress Notes (Signed)
Chief Complaint  Patient presents with  . Annual Exam    fasting annual exam with pap. Patient states that Dr.Brook Edward Jolly put her bladder in a sling so she isn't sure if she should go back to her for a pap or if you can do it. UA showed trace blood. Thinks she may have a sinus infection and her she has been having some LBP x several months.    Sherry Huff is a 47 y.o. female who presents for a complete physical.  She has the following concerns: Med check: Hypertension follow-up:  Blood pressures elsewhere are 120's/85-90.  Denies dizziness, headaches, chest pain.  Denies side effects of medications. Hyperlipidemia follow-up:  Patient is reportedly following a low-fat, low cholesterol diet.  Compliant with medications (lipitor--stopped the Niaspan about 6 months ago when pharmacy told her they no longer made it.  She never checked without our office).  Denies medication side effects  URI symptoms x 1 week.  Nasal mucus was discolored at first, getting clearer.  Sinus headaches persist, worse at the end of the day.  Using coricidin HBP which dries up her a lot.    Smoker:  Previously took 1 month of Chantix--tolerated without side effects, and was able to quit for 5 months.  Interested in trying it again.  Health maintenance: Immunization History  Administered Date(s) Administered  . Influenza Split 06/04/2005, 06/19/2006, 04/25/2008  . Pneumococcal Polysaccharide 10/19/2012  . Td 03/06/1995, 06/04/2005  . Tdap 10/19/2012   Last Pap smear: 2 years ago, no abnormal Last mammogram: 03/2011 Last colonoscopy: never Last DEXA: never Dentist: every 3 months Ophtho: 2 years Exercise: "not much"  Past Medical History  Diagnosis Date  . Hyperlipidemia   . Hypertension   . Tobacco use disorder     Past Surgical History  Procedure Laterality Date  . Abdominal hysterectomy  2010    partial  . Cesarean section      x2  . Bunionectomy      left  . Ganglion cyst excision     left arm    History   Social History  . Marital Status: Married    Spouse Name: N/A    Number of Children: 2  . Years of Education: N/A   Occupational History  . analyst     Social History Main Topics  . Smoking status: Current Every Day Smoker -- 0.50 packs/day for 10 years    Types: Cigarettes  . Smokeless tobacco: Never Used  . Alcohol Use: Yes     Comment: 1 drink maybe every 3 months.  . Drug Use: No  . 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 Not on file   Social History Narrative   Married, 2 sons (1 in college, 1 in third grade).  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    Family History  Problem Relation Age of Onset  . Cancer Mother     died of leukemia  . Cancer Father     died of lung cancer  . Hypertension Father   . Depression Sister   . Dementia Sister     related to self-inflected gunshot wound to head  . Hypertension Brother   . Hyperlipidemia Brother   . Heart disease Brother     CABG at 64  . Stroke Paternal Aunt   . Heart disease Paternal Uncle   . COPD Paternal  Uncle    Current Outpatient Prescriptions on File Prior to Visit  Medication Sig Dispense Refill  . aspirin 81 MG tablet Take 81 mg by mouth daily.        Marland Kitchen atorvastatin (LIPITOR) 80 MG tablet Take 1 tablet (80 mg total) by mouth daily.  30 tablet  1  . Multiple Vitamin (MULTIVITAMIN) capsule Take 1 capsule by mouth daily.        Marland Kitchen KRILL OIL 1000 MG CAPS Take by mouth.        . niacin (NIASPAN) 500 MG CR tablet Take 1 tablet (500 mg total) by mouth at bedtime.  90 tablet  3   No current facility-administered medications on file prior to visit.    Allergies  Allergen Reactions  . Codeine Nausea And Vomiting  . Penicillins Nausea And Vomiting   ROS:  The patient denies anorexia, fever, headaches,  vision changes, decreased hearing, ear pain, sore throat, breast concerns, chest pain,  palpitations, dizziness, syncope, dyspnea on exertion, cough, swelling, nausea, vomiting, diarrhea, constipation, abdominal pain, melena, hematochezia, indigestion/heartburn, hematuria, incontinence, dysuria, vaginal bleeding, discharge, odor or itch, genital lesions, joint pains, numbness, tingling, weakness, tremor, suspicious skin lesions, depression, anxiety, abnormal bleeding/bruising, or enlarged lymph nodes.  Lost 22 pounds gradually, a few in the last 6 months.   +sinus headache Slight vaginal itch.  Rare, mild hot flashes +low back pain, intermittent (not in the last few days), sometimes runs down right leg   PHYSICAL EXAM: BP 128/88  Pulse 72  Ht 5\' 3"  (1.6 m)  Wt 178 lb (80.74 kg)  BMI 31.54 kg/m2  General Appearance:    Alert, cooperative, no distress, appears stated age  Head:    Normocephalic, without obvious abnormality, atraumatic  Eyes:    PERRL, conjunctiva/corneas clear, EOM's intact, fundi    benign  Ears:    Normal TM's and external ear canals  Nose:   Nares normal, mucosa mildly edematous, no erythema or purulence.  Sinuses nontender.  Throat:   Lips, mucosa, and tongue normal; teeth and gums normal  Neck:   Supple, no lymphadenopathy;  thyroid:  no   enlargement/tenderness/nodules; no carotid   bruit or JVD  Back:    Spine nontender, no curvature, ROM normal, no CVA     tenderness  Lungs:     Clear to auscultation bilaterally without wheezes, rales or     ronchi; respirations unlabored  Chest Wall:    No tenderness or deformity   Heart:    Regular rate and rhythm, S1 and S2 normal, no murmur, rub   or gallop  Breast Exam:    No tenderness, masses, or nipple discharge or inversion.      No axillary lymphadenopathy  Abdomen:     Soft, non-tender, nondistended, normoactive bowel sounds,    no masses, no hepatosplenomegaly  Genitalia:    Normal external genitalia without lesions.  BUS and vagina normal; No abnormal vaginal discharge.  Uterus absent, adnexa not  enlarged, nontender, no masses.  Pap not performed  Rectal:    Normal sphincter tone, no masses; heme negative stool  Extremities:   No clubbing, cyanosis or edema  Pulses:   2+ and symmetric all extremities  Skin:   Skin color, texture, turgor normal, no rashes or lesions  Lymph nodes:   Cervical, supraclavicular, and axillary nodes normal  Neurologic:   CNII-XII intact, normal strength, sensation and gait; reflexes 2+ and symmetric throughout          Psych:  Normal mood, affect, hygiene and grooming.    ASSESSMENT/PLAN:  Routine general medical examination at a health care facility - Plan: Visual acuity screening, POCT Urinalysis Dipstick  Need for prophylactic vaccination against Streptococcus pneumoniae (pneumococcus) - Plan: Pneumococcal polysaccharide vaccine 23-valent greater than or equal to 2yo subcutaneous/IM  Need for Tdap vaccination - Plan: Tdap vaccine greater than or equal to 7yo IM  Tobacco use disorder - Plan: varenicline (CHANTIX STARTING MONTH PAK) 0.5 MG X 11 & 1 MG X 42 tablet, varenicline (CHANTIX CONTINUING MONTH PAK) 1 MG tablet  Hyperlipidemia - labs today. refill meds after labs back, if at goal - Plan: Lipid panel  Essential hypertension, benign - borderline control (diastolic).  daily exercise, low sodium diet, periodically check bp's.  continue current meds, weight loss - Plan: Comprehensive metabolic panel, benazepril (LOTENSIN) 20 MG tablet  Other malaise and fatigue - Plan: CBC with Differential, TSH, Vitamin D 25 hydroxy  Encounter for long-term (current) use of other medications - Plan: Comprehensive metabolic panel, Lipid panel, CBC with Differential  Acute upper respiratory infections of unspecified site   URI--recommend sinus rinses and/or mucinex.   Call if symptoms worsen (discolored mucus returns, fevers, worsening sinus pain). Smoking--restart Chantix.  Set quit date for 10-14 days after starting, and contact company for their free counseling.  Risks/side effects of meds reviewed.  Pneumovax given due to smoking. HTN--well controlled. Ideally diastolic should be a little lower.  Continue with low sodium diet and weight loss.  Exercise daily Hyperlipidemia--awaiting lab results to see if niaspan needs to be restarted.  Continue lipitor.  Will refill after labs reviewed.  Discussed monthly self breast exams and yearly mammograms after the age of 60; at least 30 minutes of aerobic activity at least 5 days/week; proper sunscreen use reviewed; healthy diet, including goals of calcium and vitamin D intake and alcohol recommendations (less than or equal to 1 drink/day) reviewed; regular seatbelt use; changing batteries in smoke detectors.  Immunization recommendations discussed--Tdap and pneumovax given.  Colonoscopy recommendations reviewed--age 50

## 2012-10-19 NOTE — Telephone Encounter (Signed)
This encounter was created in error - please disregard.

## 2012-10-20 ENCOUNTER — Encounter: Payer: Self-pay | Admitting: Family Medicine

## 2012-10-20 LAB — VITAMIN D 25 HYDROXY (VIT D DEFICIENCY, FRACTURES): Vit D, 25-Hydroxy: 41 ng/mL (ref 30–89)

## 2012-10-21 ENCOUNTER — Telehealth: Payer: Self-pay | Admitting: Family Medicine

## 2012-10-24 ENCOUNTER — Other Ambulatory Visit: Payer: Self-pay | Admitting: Family Medicine

## 2012-10-29 NOTE — Telephone Encounter (Signed)
LM

## 2013-05-29 ENCOUNTER — Other Ambulatory Visit: Payer: Self-pay | Admitting: Family Medicine

## 2013-07-06 ENCOUNTER — Other Ambulatory Visit: Payer: Self-pay | Admitting: Family Medicine

## 2013-07-06 NOTE — Telephone Encounter (Signed)
IS THIS OKAY I DONT KNOW HOW YOU DO YOUR CHOLESTEROL MED REFILLS

## 2013-07-06 NOTE — Telephone Encounter (Signed)
She has no f/u scheduled.  She is due for CPE in March.  Please schedule for CPE, and refill until appt (prefer it NOT to be longer than 1 month over a year since her last labs, or she may need to come in for at least the labs prior to her CPE)

## 2013-08-06 ENCOUNTER — Other Ambulatory Visit: Payer: Self-pay | Admitting: Family Medicine

## 2013-11-08 ENCOUNTER — Other Ambulatory Visit: Payer: Self-pay

## 2013-11-08 DIAGNOSIS — Z1231 Encounter for screening mammogram for malignant neoplasm of breast: Secondary | ICD-10-CM

## 2013-11-10 ENCOUNTER — Ambulatory Visit (INDEPENDENT_AMBULATORY_CARE_PROVIDER_SITE_OTHER): Payer: BC Managed Care – PPO | Admitting: Family Medicine

## 2013-11-10 ENCOUNTER — Encounter: Payer: Self-pay | Admitting: Family Medicine

## 2013-11-10 VITALS — BP 150/90 | HR 72 | Ht 63.5 in | Wt 171.0 lb

## 2013-11-10 DIAGNOSIS — M542 Cervicalgia: Secondary | ICD-10-CM

## 2013-11-10 DIAGNOSIS — I10 Essential (primary) hypertension: Secondary | ICD-10-CM

## 2013-11-10 DIAGNOSIS — L0291 Cutaneous abscess, unspecified: Secondary | ICD-10-CM

## 2013-11-10 DIAGNOSIS — E785 Hyperlipidemia, unspecified: Secondary | ICD-10-CM

## 2013-11-10 DIAGNOSIS — L0390 Cellulitis, unspecified: Secondary | ICD-10-CM

## 2013-11-10 DIAGNOSIS — Z79899 Other long term (current) drug therapy: Secondary | ICD-10-CM

## 2013-11-10 DIAGNOSIS — F17200 Nicotine dependence, unspecified, uncomplicated: Secondary | ICD-10-CM

## 2013-11-10 DIAGNOSIS — Z0000 Encounter for general adult medical examination without abnormal findings: Secondary | ICD-10-CM

## 2013-11-10 LAB — COMPREHENSIVE METABOLIC PANEL
ALT: 12 U/L (ref 0–35)
AST: 14 U/L (ref 0–37)
Albumin: 4.4 g/dL (ref 3.5–5.2)
Alkaline Phosphatase: 77 U/L (ref 39–117)
BUN: 9 mg/dL (ref 6–23)
CO2: 28 mEq/L (ref 19–32)
Calcium: 9.4 mg/dL (ref 8.4–10.5)
Chloride: 107 mEq/L (ref 96–112)
Creat: 0.8 mg/dL (ref 0.50–1.10)
Glucose, Bld: 83 mg/dL (ref 70–99)
Potassium: 4 mEq/L (ref 3.5–5.3)
Sodium: 143 mEq/L (ref 135–145)
Total Bilirubin: 0.8 mg/dL (ref 0.2–1.2)
Total Protein: 6.7 g/dL (ref 6.0–8.3)

## 2013-11-10 LAB — CBC WITH DIFFERENTIAL/PLATELET
Basophils Absolute: 0 10*3/uL (ref 0.0–0.1)
Basophils Relative: 0 % (ref 0–1)
Eosinophils Absolute: 0.4 10*3/uL (ref 0.0–0.7)
Eosinophils Relative: 5 % (ref 0–5)
HCT: 45.6 % (ref 36.0–46.0)
Hemoglobin: 16.3 g/dL — ABNORMAL HIGH (ref 12.0–15.0)
Lymphocytes Relative: 41 % (ref 12–46)
Lymphs Abs: 2.9 10*3/uL (ref 0.7–4.0)
MCH: 30 pg (ref 26.0–34.0)
MCHC: 35.7 g/dL (ref 30.0–36.0)
MCV: 84 fL (ref 78.0–100.0)
Monocytes Absolute: 0.4 10*3/uL (ref 0.1–1.0)
Monocytes Relative: 6 % (ref 3–12)
Neutro Abs: 3.4 10*3/uL (ref 1.7–7.7)
Neutrophils Relative %: 48 % (ref 43–77)
Platelets: 264 10*3/uL (ref 150–400)
RBC: 5.43 MIL/uL — ABNORMAL HIGH (ref 3.87–5.11)
RDW: 13.6 % (ref 11.5–15.5)
WBC: 7 10*3/uL (ref 4.0–10.5)

## 2013-11-10 LAB — LIPID PANEL
Cholesterol: 126 mg/dL (ref 0–200)
HDL: 36 mg/dL — ABNORMAL LOW (ref >39)
LDL Cholesterol: 69 mg/dL (ref 0–99)
Total CHOL/HDL Ratio: 3.5 Ratio
Triglycerides: 106 mg/dL (ref <150)
VLDL: 21 mg/dL (ref 0–40)

## 2013-11-10 LAB — POCT URINALYSIS DIPSTICK
Bilirubin, UA: NEGATIVE
Glucose, UA: NEGATIVE
Ketones, UA: NEGATIVE
Leukocytes, UA: NEGATIVE
Nitrite, UA: NEGATIVE
Protein, UA: NEGATIVE
Spec Grav, UA: <=1.005
Urobilinogen, UA: NEGATIVE
pH, UA: 6

## 2013-11-10 MED ORDER — ATORVASTATIN CALCIUM 80 MG PO TABS: ORAL_TABLET | ORAL | Status: DC

## 2013-11-10 MED ORDER — DOXYCYCLINE HYCLATE 100 MG PO TABS: 100.0000 mg | ORAL_TABLET | Freq: Two times a day (BID) | ORAL | Status: DC

## 2013-11-10 MED ORDER — BENAZEPRIL HCL 20 MG PO TABS: ORAL_TABLET | ORAL | Status: DC

## 2013-11-10 NOTE — Progress Notes (Signed)
Chief Complaint  Patient presents with  . Annual Exam    fasting annual exam with pap. UA showed trace blood, pt is asymptomatic. Has a boil in her genital are-been there about 6-8 months, sometimes better than other times. Also complains of some neck tension x 3-4 months.    Sherry Huff is a 48 y.o. female who presents for a complete physical.  She has the following concerns:  She has had a boil in her pantyline crease (R groin) for 6-8 months.  It gets smaller, then periodically gets inflamed and drains.  Over the past weekend it flared, drained only a small amount.  She is reporting tension/stress, and pain in her neck and shoulders.  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 She has computer at about eye-level (raised up).    Hypertension follow-up:  Blood pressures elsewhere are 135-140/90-100.  She has been having some headaches lately.  She is compliant with her blood pressure medication.  She uses sea salt when salting is needed, tries to follow low sodium diet.  Denies dizziness, chest pain, shortness of breath or edema.  Denies side effects of medications. +stress at work, and stress related to her sister--having to put her in a nursing home, waiting to place her in memory care.  Also stress related to her niece having a difficult time.   Hyperlipidemia follow-up:  Patient is reportedly following a low-fat, low cholesterol diet.  Compliant with taking lipitor, but ran out of krill oil 2 months ago.  Has been off Niaspan for over a year.  Denies side effects from the statin.  Tobacco Abuse:  Previously took 1 month of Chantix--tolerated without side effects, and was able to quit for 5 months.  We gave her another rx for chantix at her physical last year.  She filled the prescription for Chantix, but hasn't started taking it yet.    Immunization History  Administered Date(s) Administered  . Influenza Split 06/04/2005, 06/19/2006, 04/25/2008  . Pneumococcal Polysaccharide-23 10/19/2012   . Td 03/06/1995, 06/04/2005  . Tdap 10/19/2012  hasn't been getting flu shots Last Pap smear: 3 years ago, no abnormal; had hysterectomy for benign reasons, no cervix Last mammogram: 03/2011; has it scheduled Last colonoscopy: never  Last DEXA: never  Dentist: regularly (usually every 3 mos); last visit was 6 months ago Ophtho: 3 years ago; wears glasses, "due" Exercise: "not much"  Past Medical History  Diagnosis Date  . Hyperlipidemia   . Hypertension   . Tobacco use disorder     Past Surgical History  Procedure Laterality Date  . Abdominal hysterectomy  2010    partial (with bladder sling); benign  . Cesarean section      x2  . Bunionectomy      left  . Ganglion cyst excision      left arm  . Incontinence surgery  2010    Dr. Philis Pique    History   Social History  . Marital Status: Married    Spouse Name: N/A    Number of Children: 2  . Years of Education: N/A   Occupational History  . analyst     Social History Main Topics  . Smoking status: Current Every Day Smoker -- 0.50 packs/day for 10 years    Types: Cigarettes  . Smokeless tobacco: Never Used  . Alcohol Use: Yes     Comment: 1 drink maybe every 3 months.  . Drug Use: No  . 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 Not on file   Social History Narrative   Married, 2 sons (1 in [place], and a 35 yo).  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    Family History  Problem Relation Age of Onset  . Cancer Mother     died of leukemia  . Cancer Father     died of lung cancer  . Hypertension Father   . Depression Sister     bipolar  . Dementia Sister     related to self-inflected gunshot wound to head  . Hypertension Brother   . Hyperlipidemia Brother   . Heart disease Brother     CABG at 9  . Stroke Paternal Aunt   . Heart disease Paternal Uncle   .  COPD Paternal Uncle   . Diabetes Maternal Aunt   . Breast cancer Neg Hx   . Colon cancer Neg Hx    Outpatient Encounter Prescriptions as of 11/10/2013  Medication Sig Note  . aspirin 81 MG tablet Take 81 mg by mouth daily.     Marland Kitchen atorvastatin (LIPITOR) 80 MG tablet TAKE 1 TABLET (80 MG TOTAL) BY MOUTH DAILY.   . benazepril (LOTENSIN) 20 MG tablet Take 1 tablet (20 mg total) by mouth daily.   . Coenzyme Q10 (COQ10) 200 MG CAPS Take 1 tablet by mouth daily.   Marland Kitchen KRILL OIL 1000 MG CAPS Take by mouth.   11/10/2013: Ran out a couple of months ago  . Multiple Vitamin (MULTIVITAMIN) capsule Take 1 capsule by mouth daily.   11/10/2013: Ran out a couple of months ago  . [DISCONTINUED] benazepril (LOTENSIN) 20 MG tablet TAKE 1 TABLET (20 MG TOTAL) BY MOUTH DAILY.   . [DISCONTINUED] niacin (NIASPAN) 500 MG CR tablet Take 1 tablet (500 mg total) by mouth at bedtime. 10/19/2012: Pt states pharmacy told her they didn't make it anymore--she never contacted [place] (?)  . [DISCONTINUED] varenicline (CHANTIX CONTINUING MONTH PAK) 1 MG tablet Take 1 tablet (1 mg total) by mouth 2 (two) times daily.   . [DISCONTINUED] varenicline (CHANTIX STARTING MONTH PAK) 0.5 MG X 11 & 1 MG X 42 tablet Take one 0.5 mg tablet by mouth once daily for 3 days, then increase to one 0.5 mg tablet twice daily for 4 days, then increase to one 1 mg tablet twice daily.    Allergies  Allergen Reactions  . Codeine Nausea And Vomiting  . Penicillins Nausea And Vomiting   ROS: The patient denies anorexia, fever, vision changes, decreased hearing, ear pain, sore throat, breast concerns, chest pain, palpitations, dizziness, syncope, dyspnea on exertion, cough, swelling, nausea, vomiting, diarrhea, constipation, abdominal pain, melena, hematochezia, indigestion/heartburn, hematuria, incontinence, dysuria, vaginal bleeding, discharge, odor or itch, genital lesions, joint pains (only back, neck, shoulders), numbness, tingling, weakness, tremor, suspicious  skin lesions, depression, anxiety, abnormal bleeding/bruising, or enlarged lymph nodes.  She has continued to gradually lose weight, down another 7 pounds since last year.  She attributes the weight loss to taking CoQ10 per Dr. Irena Cords mild hot flashes  +low back pain every morning.  Doesn't radiate down the leg as much as it did last year.  Pain is mostly in the middle of her lower back. Gets better during the day. Occasional sinus headache, congestion Some slight stress incontinence with cough/sneeze (much improved since bladder sling).   PHYSICAL EXAM:  BP 140/100  Pulse 72  Ht 5' 3.5" (1.613 m)  Wt 171 lb (77.565 kg)  BMI 29.81 kg/m2 150/90 on repeat by MD, RA  General Appearance:  Alert, cooperative, no distress, appears stated age   Head:  Normocephalic, without obvious abnormality, atraumatic   Eyes:  PERRL, conjunctiva/corneas clear, EOM's intact, fundi  benign   Ears:  Normal TM's and external ear canals   Nose:  Nares normal, mucosa mildly edematous, no erythema or purulence. Sinuses nontender.   Throat:  Lips, mucosa, and tongue normal; teeth and gums normal   Neck:  Supple, no lymphadenopathy; thyroid: no enlargement/tenderness/nodules; no carotid  bruit or JVD. Tender at L trapezius, more tender with some spasm at L SCM. No spinal tenderness or significant spasm noted posteriorly  Back:  Spine nontender, no curvature, ROM normal, no CVA tenderness   Lungs:  Clear to auscultation bilaterally without rales or ronchi; Trace wheeze initially, cleared with deep breath.  respirations unlabored   Chest Wall:  No tenderness or deformity   Heart:  Regular rate and rhythm, S1 and S2 normal, no murmur, rub  or gallop   Breast Exam:  No tenderness, masses, or nipple discharge or inversion. No axillary lymphadenopathy   Abdomen:  Soft, non-tender, nondistended, normoactive bowel sounds,  no masses, no hepatosplenomegaly   Genitalia:  Normal external genitalia without lesions. BUS and  vagina normal; No abnormal vaginal discharge. Uterus absent, adnexa not enlarged, nontender, no masses. Pap not performed.  There is an erythematous (almost violacious), healing abscess/boil at her R groin (external labial area) that has no active drainage. +TTP.  No inguinal adenopathy noted  Rectal:  Normal sphincter tone, no masses; heme negative stool   Extremities:  No clubbing, cyanosis or edema   Pulses:  2+ and symmetric all extremities   Skin:  Skin color, texture, turgor normal, no rashes or lesions   Lymph nodes:  Cervical, supraclavicular, and axillary nodes normal   Neurologic:  CNII-XII intact, normal strength, sensation and gait; reflexes 2+ and symmetric throughout          Psych: Normal mood, affect, hygiene and grooming.   ASSESSMENT/PLAN:  Routine general medical examination at a health care facility - Plan: Visual acuity screening, POCT Urinalysis Dipstick, Comprehensive metabolic panel, CBC with Differential, TSH, Lipid panel  Hyperlipidemia - Plan: Comprehensive metabolic panel, Lipid panel, atorvastatin (LIPITOR) 80 MG tablet  Tobacco use disorder - risks reviewed; encouraged cessation, and to set a quit date.  resources provided. Has Chantix, needs to start  Essential hypertension, benign - suboptimally controlled.  increase to 1.5 tablets daily and monitor; further increase to 40mg  if BP remains >140/90.  f/u 4-6 weeks - Plan: CBC with Differential, benazepril (LOTENSIN) 20 MG tablet  Encounter for long-term (current) use of other medications - Plan: Comprehensive metabolic panel, CBC with Differential, Lipid panel  Abscess - R groin - Plan: doxycycline (VIBRA-TABS) 100 MG tablet  Neck pain - posture reviewed; heat, massage, stretches shown.  Motrin (OTC) 600mg  TID with meals for a week   Bring list of blood pressures (and monitor if desired) to f/u visit in 4-6 weeks.   Discussed monthly self breast exams and yearly mammograms after the age of 47; at least 30  minutes of aerobic activity at least 5 days/week; proper sunscreen use reviewed; healthy diet, including goals of calcium and vitamin D intake and alcohol recommendations (less than or equal to 1 drink/day) reviewed; regular seatbelt use; changing batteries in smoke detectors. Immunization recommendations  discussed--yearly flu shots recommended. Colonoscopy recommendations reviewed--age 60

## 2013-11-10 NOTE — Patient Instructions (Signed)
  HEALTH MAINTENANCE RECOMMENDATIONS:  It is recommended that you get at least 30 minutes of aerobic exercise at least 5 days/week (for weight loss, you may need as much as 60-90 minutes). This can be any activity that gets your heart rate up. This can be divided in 10-15 minute intervals if needed, but try and build up your endurance at least once a week.  Weight bearing exercise is also recommended twice weekly.  Eat a healthy diet with lots of vegetables, fruits and fiber.  "Colorful" foods have a lot of vitamins (ie green vegetables, tomatoes, red peppers, etc).  Limit sweet tea, regular sodas and alcoholic beverages, all of which has a lot of calories and sugar.  Up to 1 alcoholic drink daily may be beneficial for women (unless trying to lose weight, watch sugars).  Drink a lot of water.  Calcium recommendations are 1200-1500 mg daily (1500 mg for postmenopausal women or women without ovaries), and vitamin D 1000 IU daily.  This should be obtained from diet and/or supplements (vitamins), and calcium should not be taken all at once, but in divided doses.  Monthly self breast exams and yearly mammograms for women over the age of 75 is recommended.  Sunscreen of at least SPF 30 should be used on all sun-exposed parts of the skin when outside between the hours of 10 am and 4 pm (not just when at beach or pool, but even with exercise, golf, tennis, and yard work!)  Use a sunscreen that says "broad spectrum" so it covers both UVA and UVB rays, and make sure to reapply every 1-2 hours.  Remember to change the batteries in your smoke detectors when changing your clock times in the spring and fall.  Use your seat belt every time you are in a car, and please drive safely and not be distracted with cell phones and texting while driving.  Please try and quit smoking--start thinking about why/when you smoke (habit, boredom, stress) in order to come up with effective strategies to cut back or quit. Available  resources to help you quit include free counseling through [facility] ([facility]uit[facility] or 1-800-QUITNOW), smoking cessation classes through [facility] (call to find out schedule), over-the-counter nicotine replacements, and e-cigarettes (although this may not help break the hand-mouth habit).  Many insurance companies also have smoking cessation programs (which may decrease the cost of patches, meds if enrolled).  If these methods are not effective for you, and you are motivated to quit, return to discuss the possibility of prescription medications.  Use heat to the neck, and do stretches and strengthening exercises as shown, twice daily, along with massage. Posture is important (ie looking down at phone is bad). You can use Motrin 600mg  (3 over the counter tablets) three times daily with food for up to a week to help with the pain and inflammation (OR 2 aleve twice daily with food)  High blood pressure: increase benazepril to 30mg  (1.5 tablets every day) for 2 weeks. If BP remains >140/90 then increase to 40mg  ( 2 tabs).  Bring list of blood pressures (and monitor if desired) to f/u visit in 4-6 weeks.

## 2013-11-11 ENCOUNTER — Encounter: Payer: Self-pay | Admitting: Family Medicine

## 2013-11-11 LAB — TSH: TSH: 2.27 u[IU]/mL (ref 0.350–4.500)

## 2013-11-12 ENCOUNTER — Other Ambulatory Visit: Payer: Self-pay | Admitting: Family Medicine

## 2013-11-22 ENCOUNTER — Ambulatory Visit
Admission: RE | Admit: 2013-11-22 | Discharge: 2013-11-22 | Disposition: A | Discharge status: Home / Self Care | Payer: BC Managed Care – PPO | Source: Ambulatory Visit

## 2013-11-22 DIAGNOSIS — Z1231 Encounter for screening mammogram for malignant neoplasm of breast: Secondary | ICD-10-CM

## 2013-12-22 ENCOUNTER — Ambulatory Visit (INDEPENDENT_AMBULATORY_CARE_PROVIDER_SITE_OTHER): Payer: BC Managed Care – PPO | Admitting: Family Medicine

## 2013-12-22 ENCOUNTER — Encounter: Payer: Self-pay | Admitting: Family Medicine

## 2013-12-22 ENCOUNTER — Ambulatory Visit: Payer: Self-pay | Admitting: Family Medicine

## 2013-12-22 VITALS — BP 148/92 | HR 80 | Ht 63.5 in | Wt 173.0 lb

## 2013-12-22 DIAGNOSIS — I10 Essential (primary) hypertension: Secondary | ICD-10-CM

## 2013-12-22 DIAGNOSIS — F17200 Nicotine dependence, unspecified, uncomplicated: Secondary | ICD-10-CM

## 2013-12-22 MED ORDER — HYDROCHLOROTHIAZIDE 25 MG PO TABS: 25.0000 mg | ORAL_TABLET | Freq: Every day | ORAL | Status: DC

## 2013-12-22 NOTE — Progress Notes (Signed)
Chief Complaint  Patient presents with  . Hypertension    6 week follow up.   She increased her lotensin to 30 mg (1.5 tablets) after her last visit and BP's at home were still running 146-154/90-97, with pulse 66-81.  She further increased dose to 40mg  just in the last couple of days.  She denies any side effects, but didn't see any change in blood pressure. She is occasionally having headaches, about once a week.  Sometimes she will hear a little ringing in her wear when she thinks her BP is high. Once a week she will wake up with a headache, and then it stays with her all day.  She admits to grinding her teeth.  Headaches are bitemporal.  She has a bite guard that she needs to pick up from the dentist and start using.  She denies any chest pain, shortness of breath.  She brings in her BP monitor today, but it keeps reading "E" for error, even when tested on the nurse--not working today, so unknown if reading from home are accurate.  Didn't check BP elsewhere.  She still hasn't started the Chantix yet, continues to smoke  Past Medical History  Diagnosis Date  . Hyperlipidemia   . Hypertension   . Tobacco use disorder    Past Surgical History  Procedure Laterality Date  . Abdominal hysterectomy  2010    partial (with bladder sling); benign  . Cesarean section      x2  . Bunionectomy      left  . Ganglion cyst excision      left arm  . Incontinence surgery  2010    Dr. Philis Pique   History   Social History  . Marital Status: Married    Spouse Name: N/A    Number of Children: 2  . Years of Education: N/A   Occupational History  . analyst     Social History Main Topics  . Smoking status: Current Every Day Smoker -- 0.50 packs/day for 10 years    Types: Cigarettes  . Smokeless tobacco: Never Used  . Alcohol Use: Yes     Comment: 1 drink maybe every 3 months.  . Drug Use: No  . 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 Not on file   Social History Narrative   Married, 2 sons (1 in [place], and a 21 yo).  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   Outpatient Encounter Prescriptions as of 12/22/2013  Medication Sig Note  . aspirin 81 MG tablet Take 81 mg by mouth daily.     Marland Kitchen atorvastatin (LIPITOR) 80 MG tablet TAKE 1 TABLET (80 MG TOTAL) BY MOUTH DAILY.   . benazepril (LOTENSIN) 20 MG tablet Take 1.5 tablets by mouth daily.  If BP remains >140/90, increase to 2 tablets daily 12/22/2013: Taking 2 tablets daily  . Coenzyme Q10 (COQ10) 200 MG CAPS Take 1 tablet by mouth daily.   . hydrochlorothiazide (HYDRODIURIL) 25 MG tablet Take 1 tablet (25 mg total) by mouth daily.   Marland Kitchen KRILL OIL 1000 MG CAPS Take by mouth.   11/10/2013: Ran out a couple of months ago  . Multiple Vitamin (MULTIVITAMIN) capsule Take 1 capsule by mouth daily.   11/10/2013: Ran out a couple of months ago  . [DISCONTINUED] atorvastatin (LIPITOR) 80 MG tablet TAKE 1  TABLET (80 MG TOTAL) BY MOUTH DAILY.   . [DISCONTINUED] benazepril (LOTENSIN) 20 MG tablet TAKE 1 TABLET (20 MG TOTAL) BY MOUTH DAILY.   . [DISCONTINUED] doxycycline (VIBRA-TABS) 100 MG tablet Take 1 tablet (100 mg total) by mouth 2 (two) times daily.   (HCTZ just added today, NOT taking prior to today's visit)  Allergies  Allergen Reactions  . Codeine Nausea And Vomiting  . Penicillins Nausea And Vomiting    ROS:  Denies dizziness, URI symptoms, fevers, chills, cough, shortness of breath, GI or GU complaints. No chest pain, palpitations.  +headaches as per HPI.  No edema, muscle cramps, joint pains, or other concerns   PHYSICAL EXAM: BP 148/92  Pulse 80  Ht 5' 3.5" (1.613 m)  Wt 173 lb (78.472 kg)  BMI 30.16 kg/m2 Patient was initially late for visit, rushed. 148/92 on repeat by MD, RA Well developed, pleasant female in no distress Neck: No lymphadenopathy or mass Heart: regular rate  and rhythm Lungs: clear bilaterally Abdomen: soft, nontender Extremities: no edema Psych: normal mood, affect Neuro: alert and oriented, normal cranial nerves, gait, strength  ASSESSMENT/PLAN:  Essential hypertension, benign - no change with increase of benazepril from 20 to 40mg .  Add HCTZ and resume 20mg  dose.  continue monitoring - Plan: hydrochlorothiazide (HYDRODIURIL) 25 MG tablet  Tobacco use disorder - encouraged her to start the Chantix (she already has), and set quit date for 2 wks later. suggested July 4th   Decrease dose of lotensin back to 20mg . Start taking HCTZ 25 mg once daily (take in the morning; okay to take both together).  If your blood pressure drops to <110/60, and/or you feel dizzy, then cut back on the HCTZ dose to just 1/2 tablet.   Continue to monitor your blood pressure--if you machine isn't working, then periodically check it at the pharmacy.  Return in 4-6 weeks.  Expect to have blood drawn then (nonfasting) Eat potassium-rich foods such as bananas

## 2013-12-22 NOTE — Patient Instructions (Signed)
  Decrease dose of lotensin back to 20mg . Start taking HCTZ 25 mg once daily (take in the morning; okay to take both together).  If your blood pressure drops to <110/60, and/or you feel dizzy, then cut back on the HCTZ dose to just 1/2 tablet.   Continue to monitor your blood pressure--if you machine isn't working, then periodically check it at the pharmacy.  Return in 4-6 weeks.  Expect to have blood drawn then (nonfasting) Eat potassium-rich foods such as bananas   I recommend starting the Chantix, and setting quit date for 2 weeks later

## 2013-12-30 ENCOUNTER — Telehealth: Payer: Self-pay | Admitting: *Deleted

## 2013-12-30 NOTE — Telephone Encounter (Signed)
The area she is describing can either be her bladder or her uterus.  The new medication that was added was HCTZ.  This might cause her to urinate more frequently, but I don't think should cause spasm or pain (unless it is related to her bladder filling faster).  If she is having ongoing concern, she can return sooner than her next appointment, to have her urine checked (for UTI).  Otherwise, if only occuring once daily, and no dysuria, blood in urine, worsening pain or other symptoms, okay to wait.

## 2013-12-30 NOTE — Telephone Encounter (Signed)
Patient called and started her new rx for Lotensin this past weekend. Ever since the first day taking this medication she has once a day experienced a cramping that is in her lower abdomen (like where c-section scar is) and it rolls from one side to the other, get tight and then is gone. Happens only once a day. She also mentioned that she had intercourse yesterday and while having an orgasm this occurred. She has been eating a banana a day. Wants to know if this is related to her new medication since this only started happening since starting. Please advise. Thanks.

## 2013-12-30 NOTE — Telephone Encounter (Signed)
Patient advised of Dr.Knapp's response and verbalized understanding.

## 2014-01-26 ENCOUNTER — Encounter: Payer: Self-pay | Admitting: Family Medicine

## 2014-01-26 ENCOUNTER — Ambulatory Visit (INDEPENDENT_AMBULATORY_CARE_PROVIDER_SITE_OTHER): Payer: BC Managed Care – PPO | Admitting: Family Medicine

## 2014-01-26 VITALS — BP 112/76 | HR 80 | Ht 63.5 in | Wt 171.0 lb

## 2014-01-26 DIAGNOSIS — Z79899 Other long term (current) drug therapy: Secondary | ICD-10-CM

## 2014-01-26 DIAGNOSIS — I10 Essential (primary) hypertension: Secondary | ICD-10-CM

## 2014-01-26 DIAGNOSIS — F17200 Nicotine dependence, unspecified, uncomplicated: Secondary | ICD-10-CM

## 2014-01-26 LAB — BASIC METABOLIC PANEL
BUN: 10 mg/dL (ref 6–23)
CO2: 28 mEq/L (ref 19–32)
Calcium: 9.5 mg/dL (ref 8.4–10.5)
Chloride: 100 mEq/L (ref 96–112)
Creat: 0.79 mg/dL (ref 0.50–1.10)
Glucose, Bld: 87 mg/dL (ref 70–99)
Potassium: 3.4 mEq/L — ABNORMAL LOW (ref 3.5–5.3)
Sodium: 138 mEq/L (ref 135–145)

## 2014-01-26 MED ORDER — HYDROCHLOROTHIAZIDE 12.5 MG PO TABS: 12.5000 mg | ORAL_TABLET | Freq: Every day | ORAL | Status: DC

## 2014-01-26 MED ORDER — BENAZEPRIL HCL 20 MG PO TABS: 20.0000 mg | ORAL_TABLET | Freq: Every day | ORAL | Status: DC

## 2014-01-26 NOTE — Patient Instructions (Signed)
Continue your current medications. Set a quit date (2 weeks after starting the Chantix), and try and quit smoking as soon as you can (definitely before the holidays!)

## 2014-01-26 NOTE — Progress Notes (Signed)
Chief Complaint  Patient presents with  . Hypertension    6 week follow up on bp.    HCTZ was added at last visit, and benazepril dose was decreased back to 20mg  (after not seeing response to 40mg  dose).  [facility] been [facility] 126-130/80-88.  She started with 25 mg of HCTZ, but felt dizzy and nauseated. She took that for about a week, then decreased the dose to 1/2 tablet.  No longer having any dizziness or nausea.  Denies any headaches.  Denies muscle cramps.  She has been drinking V8, having bananas, potatoes.  Smoking--she was hesitant to start Chantix while new medication was being started.  Waiting until today's visit before making the decision to get back on Chantix and quit smoking.  Past Medical History  Diagnosis Date  . Hyperlipidemia   . Hypertension   . Tobacco use disorder    Past Surgical History  Procedure Laterality Date  . Abdominal hysterectomy  2010    partial (with bladder sling); benign  . Cesarean section      x2  . Bunionectomy      left  . Ganglion cyst excision      left arm  . Incontinence surgery  2010    Dr. Philis Pique   History   Social History  . Marital Status: Married    Spouse Name: N/A    Number of Children: 2  . Years of Education: N/A   Occupational History  . analyst     Social History Main Topics  . Smoking status: Current Every Day Smoker -- 0.50 packs/day for 10 years    Types: Cigarettes  . Smokeless tobacco: Never Used  . Alcohol Use: Yes     Comment: 1 drink maybe every 3 months.  . Drug Use: No  . 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 Not on file   Social History Narrative   Married, 2 sons (1 in [place], and a 18 yo).  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   Outpatient Encounter Prescriptions as of 01/26/2014  Medication Sig  . aspirin 81 MG tablet Take 81 mg by mouth  daily.    Marland Kitchen atorvastatin (LIPITOR) 80 MG tablet TAKE 1 TABLET (80 MG TOTAL) BY MOUTH DAILY.  . benazepril (LOTENSIN) 20 MG tablet Take 1 tablet (20 mg total) by mouth daily.  . Coenzyme Q10 (COQ10) 200 MG CAPS Take 1 tablet by mouth daily.  . hydrochlorothiazide (HYDRODIURIL) 12.5 MG tablet Take 1 tablet (12.5 mg total) by mouth daily.  . [DISCONTINUED] benazepril (LOTENSIN) 20 MG tablet Take 1.5 tablets by mouth daily.  If BP remains >140/90, increase to 2 tablets daily  . [DISCONTINUED] benazepril (LOTENSIN) 20 MG tablet Take 20 mg by mouth daily.  . [DISCONTINUED] hydrochlorothiazide (HYDRODIURIL) 25 MG tablet Take 1 tablet (25 mg total) by mouth daily.  Marland Kitchen KRILL OIL 1000 MG CAPS Take by mouth.    . Multiple Vitamin (MULTIVITAMIN) capsule Take 1 capsule by mouth daily.     (taking 1/2 tablet of 25mg  HCTZ prior to today's visit, along with 20mg  benazepril)  Allergies  Allergen Reactions  . Codeine Nausea And Vomiting  . Penicillins Nausea And Vomiting   ROS:  She denies headaches, dizziness (not since HCTZ dose was decreased), nausea, vomiting, bowel changes, fevers, URI symptoms, shortness of  breath. Stable smoker's cough.  Occasional reflux/heartburn. No other GI or GU complaints.  PHYSICAL EXAM: BP 112/76  Pulse 80  Ht 5' 3.5" (1.613 m)  Wt 171 lb (77.565 kg)  BMI 29.81 kg/m2 Well developed, pleasant female in no distress Neck: no lymphadenopathy, thyromegaly or mass Heart: regular rate and rhythm Lungs: clear bilaterally Extremities: no edema Psych: normal mood, affect  ASSESSMENT/PLAN:  Essential hypertension, benign - well controlled - Plan: Basic metabolic panel, benazepril (LOTENSIN) 20 MG tablet, hydrochlorothiazide (HYDRODIURIL) 12.5 MG tablet  Encounter for long-term (current) use of other medications - Plan: Basic metabolic panel  Tobacco use disorder - encouraged her to restart Chantix, and set quit date for 2 weeks later  F/u in 6 months (fasting med check)

## 2014-05-23 ENCOUNTER — Encounter: Payer: Self-pay | Admitting: Family Medicine

## 2014-07-24 ENCOUNTER — Other Ambulatory Visit: Payer: Self-pay | Admitting: Family Medicine

## 2014-07-25 ENCOUNTER — Telehealth: Payer: Self-pay | Admitting: *Deleted

## 2014-07-25 DIAGNOSIS — Z720 Tobacco use: Secondary | ICD-10-CM

## 2014-07-25 MED ORDER — VARENICLINE TARTRATE 1 MG PO TABS: 1.0000 mg | ORAL_TABLET | Freq: Two times a day (BID) | ORAL | Status: DC

## 2014-07-25 NOTE — Telephone Encounter (Signed)
If she has only taken the starter pack, then can take it for up to another 2 months.  Looks like prior rx was in 2014 and expired.  Rx sent

## 2014-07-25 NOTE — Telephone Encounter (Signed)
Patient has appt next Monday but is in need of refill on Chantix. She finished the starter pack and has stopped smoking. [facility], please.

## 2014-08-01 ENCOUNTER — Encounter: Payer: Self-pay | Admitting: Family Medicine

## 2014-08-01 ENCOUNTER — Ambulatory Visit (INDEPENDENT_AMBULATORY_CARE_PROVIDER_SITE_OTHER): Payer: BLUE CROSS/BLUE SHIELD | Admitting: Family Medicine

## 2014-08-01 VITALS — BP 130/82 | HR 68 | Ht 63.5 in | Wt 179.0 lb

## 2014-08-01 DIAGNOSIS — Z5181 Encounter for therapeutic drug level monitoring: Secondary | ICD-10-CM

## 2014-08-01 DIAGNOSIS — E785 Hyperlipidemia, unspecified: Secondary | ICD-10-CM

## 2014-08-01 DIAGNOSIS — F17200 Nicotine dependence, unspecified, uncomplicated: Secondary | ICD-10-CM

## 2014-08-01 DIAGNOSIS — Z23 Encounter for immunization: Secondary | ICD-10-CM

## 2014-08-01 DIAGNOSIS — I10 Essential (primary) hypertension: Secondary | ICD-10-CM

## 2014-08-01 DIAGNOSIS — Z720 Tobacco use: Secondary | ICD-10-CM

## 2014-08-01 LAB — LIPID PANEL
Cholesterol: 210 mg/dL — ABNORMAL HIGH (ref 0–200)
HDL: 40 mg/dL (ref >39)
LDL Cholesterol: 133 mg/dL — ABNORMAL HIGH (ref 0–99)
Total CHOL/HDL Ratio: 5.3 Ratio
Triglycerides: 187 mg/dL — ABNORMAL HIGH (ref <150)
VLDL: 37 mg/dL (ref 0–40)

## 2014-08-01 LAB — COMPREHENSIVE METABOLIC PANEL
ALT: 27 U/L (ref 0–35)
AST: 25 U/L (ref 0–37)
Albumin: 4.2 g/dL (ref 3.5–5.2)
Alkaline Phosphatase: 66 U/L (ref 39–117)
BUN: 11 mg/dL (ref 6–23)
CO2: 27 mEq/L (ref 19–32)
Calcium: 9.2 mg/dL (ref 8.4–10.5)
Chloride: 102 mEq/L (ref 96–112)
Creat: 0.8 mg/dL (ref 0.50–1.10)
Glucose, Bld: 84 mg/dL (ref 70–99)
Potassium: 3.9 mEq/L (ref 3.5–5.3)
Sodium: 139 mEq/L (ref 135–145)
Total Bilirubin: 0.5 mg/dL (ref 0.2–1.2)
Total Protein: 6.3 g/dL (ref 6.0–8.3)

## 2014-08-01 MED ORDER — ATORVASTATIN CALCIUM 80 MG PO TABS: ORAL_TABLET | ORAL | Status: DC

## 2014-08-01 NOTE — Patient Instructions (Signed)
Restart either multivitamin or a separate Vitamin D.  [phone] IU daily is recommended. We will be in touch with your lab results.  If TG are normal, you don't need to restart the krill oil.  Expect that your HDL will take more time to improve (improves after quitting smoking, but takes longer).  Congratulations on quitting smoking--keep up the good work! Consider NCQuitline.com or 1800-QUITNOW if you need additional assistance.  Periodically check your blood pressure elsewhere (at least couple of times before your next visit).  It is recommended that you get at least 30 minutes of aerobic exercise at least 5 days/week (for weight loss, you may need as much as 60-90 minutes). This can be any activity that gets your heart rate up. This can be divided in 10-15 minute intervals if needed, but try and build up your endurance at least once a week.  Weight bearing exercise is also recommended twice weekly.

## 2014-08-01 NOTE — Progress Notes (Signed)
Chief Complaint  Patient presents with  . Hypertension    fasting med check.    Patient presents to follow up on hypertension.  HCTZ was added to her regimen in June.  She didn't tolerate it at first, and dose was lowered.  She currently denies headaches, dizziness, edema, muscle cramps.  Denies side effects of medications since HCTZ dose was later lowered.  Not checking BP elsewhere. Stressors have improved some since her last visit--sister is in memory care, niece is doing better.  Always will have some work stress.  Hyperlipidemia follow-up: Patient is reportedly following a low-fat, low cholesterol diet but admits to some noncompliance with diet during the holidays. Compliant with taking lipitor, but hasn't been taking krill oil or niaspan for over a year. She also hasn't been taking her MVI for a year. Denies side effects from the statin.  Tobacco Abuse: Started Chantix start pack around 12/21, and quit smoking on 12/29.  She has nausea from the medication, short-lived, just after taking the  Medication.  Denies mood changes; does note some mildly vivid dreams.    PMH, PSH, SH reviewed.  Outpatient Encounter Prescriptions as of 08/01/2014  Medication Sig Note  . aspirin 81 MG tablet Take 81 mg by mouth daily.     Marland Kitchen atorvastatin (LIPITOR) 80 MG tablet TAKE 1 TABLET (80 MG TOTAL) BY MOUTH DAILY.   . benazepril (LOTENSIN) 20 MG tablet TAKE 1 TABLET BY MOUTH DAILY.   Marland Kitchen Coenzyme Q10 (COQ10) 200 MG CAPS Take 1 tablet by mouth daily.   . hydrochlorothiazide (MICROZIDE) 12.5 MG capsule TAKE 1 CAPSULE (12.5 MG TOTAL) BY MOUTH DAILY.   Marland Kitchen varenicline (CHANTIX CONTINUING MONTH PAK) 1 MG tablet Take 1 tablet (1 mg total) by mouth 2 (two) times daily.   . [DISCONTINUED] atorvastatin (LIPITOR) 80 MG tablet TAKE 1 TABLET (80 MG TOTAL) BY MOUTH DAILY.   Marland Kitchen KRILL OIL 1000 MG CAPS Take by mouth.   08/01/2014: Hasn't taken in about a year  . Multiple Vitamin (MULTIVITAMIN) capsule Take 1 capsule by mouth  daily.   08/01/2014: Hasn't taken in about a year   Allergies  Allergen Reactions  . Codeine Nausea And Vomiting  . Penicillins Nausea And Vomiting   ROS:  Denies fevers, chills, headaches, nausea (just mild after taking Chantix), vomiting, diarrhea.  No bleeding, bruising, rashes, urinary complaints. Coughing less since she quit smoking.  Still has some residual chest congestion in the morning. Has some mild allergies.  Occasional heartburn/reflux. Gained 8 pounds since July--all recently gained, related to holidays, birthday and quitting smoking   PHYSICAL EXAM: BP 140/86 mmHg  Pulse 68  Ht 5' 3.5" (1.613 m)  Wt 179 lb (81.194 kg)  BMI 31.21 kg/m2 130/82 on repeat by MD, RA Well developed, pleasant female in no distress HEENT: PERRL, EOMI, conjunctiva clear. Neck: no lymphadenopathy, thyromegaly or mass Heart: regular rate and rhythm Lungs: clear bilaterally Abdomen: soft, nontender, no organomegaly or mass Extremities: no edema Psych: normal mood, affect Neuro: alert and oriented, cranial nerves intact. Normal gait, strength  ASSESSMENT/PLAN:  Essential hypertension, benign - well controlled - Plan: Comprehensive metabolic panel  Need for prophylactic vaccination and inoculation against influenza - Plan: Flu Vaccine QUAD 36+ mos PF IM (Fluarix Quad PF)  Tobacco use disorder - just recently quit. Congratulated.  Encouraged exercise routine  Hyperlipidemia - Plan: Lipid panel, Comprehensive metabolic panel, atorvastatin (LIPITOR) 80 MG tablet  Medication monitoring encounter - Plan: Lipid panel, Comprehensive metabolic panel   CPE  in 6 months  Restart either MVI vs Vitamin D. Encouraged regular exercise to help maintain abstinence from cigarettes and avoid further weight gain. Continue lowfat, low sodium diet.

## 2015-02-02 ENCOUNTER — Ambulatory Visit (INDEPENDENT_AMBULATORY_CARE_PROVIDER_SITE_OTHER): Payer: BLUE CROSS/BLUE SHIELD | Admitting: Family Medicine

## 2015-02-02 ENCOUNTER — Encounter: Payer: Self-pay | Admitting: Family Medicine

## 2015-02-02 VITALS — BP 124/84 | HR 72 | Ht 64.25 in | Wt 191.2 lb

## 2015-02-02 DIAGNOSIS — I10 Essential (primary) hypertension: Secondary | ICD-10-CM

## 2015-02-02 DIAGNOSIS — Z0000 Encounter for general adult medical examination without abnormal findings: Secondary | ICD-10-CM

## 2015-02-02 DIAGNOSIS — R635 Abnormal weight gain: Secondary | ICD-10-CM | POA: Diagnosis not present

## 2015-02-02 DIAGNOSIS — E669 Obesity, unspecified: Secondary | ICD-10-CM

## 2015-02-02 DIAGNOSIS — E785 Hyperlipidemia, unspecified: Secondary | ICD-10-CM

## 2015-02-02 LAB — LIPID PANEL
Cholesterol: 293 mg/dL — ABNORMAL HIGH (ref 0–200)
HDL: 35 mg/dL — ABNORMAL LOW (ref >=46)
LDL Cholesterol: 189 mg/dL — ABNORMAL HIGH (ref 0–99)
Total CHOL/HDL Ratio: 8.4 Ratio
Triglycerides: 344 mg/dL — ABNORMAL HIGH (ref <150)
VLDL: 69 mg/dL — ABNORMAL HIGH (ref 0–40)

## 2015-02-02 LAB — POCT URINALYSIS DIPSTICK
Bilirubin, UA: NEGATIVE
Glucose, UA: NEGATIVE
Ketones, UA: NEGATIVE
Leukocytes, UA: NEGATIVE
Nitrite, UA: NEGATIVE
Protein, UA: NEGATIVE
Spec Grav, UA: 1.015
Urobilinogen, UA: NEGATIVE
pH, UA: 6

## 2015-02-02 LAB — CBC WITH DIFFERENTIAL/PLATELET
Basophils Absolute: 0 10*3/uL (ref 0.0–0.1)
Basophils Relative: 0 % (ref 0–1)
Eosinophils Absolute: 0.2 10*3/uL (ref 0.0–0.7)
Eosinophils Relative: 3 % (ref 0–5)
HCT: 47.2 % — ABNORMAL HIGH (ref 36.0–46.0)
Hemoglobin: 15.9 g/dL — ABNORMAL HIGH (ref 12.0–15.0)
Lymphocytes Relative: 42 % (ref 12–46)
Lymphs Abs: 3 10*3/uL (ref 0.7–4.0)
MCH: 29 pg (ref 26.0–34.0)
MCHC: 33.7 g/dL (ref 30.0–36.0)
MCV: 86.1 fL (ref 78.0–100.0)
MPV: 10.3 fL (ref 8.6–12.4)
Monocytes Absolute: 0.4 10*3/uL (ref 0.1–1.0)
Monocytes Relative: 6 % (ref 3–12)
Neutro Abs: 3.5 10*3/uL (ref 1.7–7.7)
Neutrophils Relative %: 49 % (ref 43–77)
Platelets: 280 10*3/uL (ref 150–400)
RBC: 5.48 MIL/uL — ABNORMAL HIGH (ref 3.87–5.11)
RDW: 14.1 % (ref 11.5–15.5)
WBC: 7.2 10*3/uL (ref 4.0–10.5)

## 2015-02-02 LAB — COMPREHENSIVE METABOLIC PANEL
ALT: 15 U/L (ref 0–35)
AST: 15 U/L (ref 0–37)
Albumin: 4.4 g/dL (ref 3.5–5.2)
Alkaline Phosphatase: 74 U/L (ref 39–117)
BUN: 11 mg/dL (ref 6–23)
CO2: 28 mEq/L (ref 19–32)
Calcium: 9.9 mg/dL (ref 8.4–10.5)
Chloride: 103 mEq/L (ref 96–112)
Creat: 0.95 mg/dL (ref 0.50–1.10)
Glucose, Bld: 90 mg/dL (ref 70–99)
Potassium: 4.2 mEq/L (ref 3.5–5.3)
Sodium: 141 mEq/L (ref 135–145)
Total Bilirubin: 0.5 mg/dL (ref 0.2–1.2)
Total Protein: 7.4 g/dL (ref 6.0–8.3)

## 2015-02-02 LAB — TSH: TSH: 3.16 u[IU]/mL (ref 0.350–4.500)

## 2015-02-02 MED ORDER — HYDROCHLOROTHIAZIDE 12.5 MG PO CAPS: ORAL_CAPSULE | ORAL | Status: DC

## 2015-02-02 MED ORDER — ATORVASTATIN CALCIUM 80 MG PO TABS: ORAL_TABLET | ORAL | Status: DC

## 2015-02-02 MED ORDER — BENAZEPRIL HCL 20 MG PO TABS: 20.0000 mg | ORAL_TABLET | Freq: Every day | ORAL | Status: DC

## 2015-02-02 NOTE — Progress Notes (Signed)
Chief Complaint  Patient presents with  . Annual Exam    fasting annual exam with pelvic/med check. UA showed trace blood, patient is not having any symptoms. No concerns today.    Sherry Huff is a 49 y.o. female who presents for a complete physical.  She is also here for follow up on chronic medical conditions (med check).  She has no acute concerns.   Patient presents to follow up on hypertension. She has not been checking BP elsewhere.  She admits to missing her pills at least once a week, missed it yesterday.  She was better about taking meds when she took them all at night, but switched to daytime when the HCTZ was added last year. She didn't tolerate HCTZ at first, and dose was lowered. She currently denies headaches, dizziness, edema, muscle cramps. Not checking BP elsewhere.+ work stress, unchanged.  Hyperlipidemia follow-up: Patient is reportedly following a low-fat, low cholesterol diet. She also misses the lipitor and krill oil at least once a week (since switching to taking in the mornings. Denies side effects from the statin.  Tobacco Abuse: She quit smoking on 12/29 using Chantix.  She only used it for 4-6 weeks.She has gained 20# since she quit smoking.    Immunization History  Administered Date(s) Administered  . Influenza Split 06/04/2005, 06/19/2006, 04/25/2008  . Influenza,inj,Quad PF,36+ Mos 08/01/2014  . Pneumococcal Polysaccharide-23 10/19/2012  . Td 03/06/1995, 06/04/2005  . Tdap 10/19/2012   Last Pap smear: 4 years ago, no abnormal; had hysterectomy for benign reasons, no cervix Last mammogram: 11/2013 Last colonoscopy: never  Last DEXA: never  Dentist: regularly, scheduled for August.  She hasn't been as good at going regularly for the last couple of years Ophtho: 4 years ago; wears glasses. She reports having had elevated pressures in the past Exercise:  Occasionally takes a 10 min walk down to the pond.  Past Medical History  Diagnosis Date   . Hyperlipidemia   . Hypertension   . Tobacco use disorder     quit 07/19/2014    Past Surgical History  Procedure Laterality Date  . Abdominal hysterectomy  2010    partial (with bladder sling); benign  . Cesarean section      x2  . Bunionectomy      left  . Ganglion cyst excision      left arm  . Incontinence surgery  2010    Dr. Philis Pique    History   Social History  . Marital Status: Married    Spouse Name: N/A  . Number of Children: 2  . Years of Education: N/A   Occupational History  . analyst     Social History Main Topics  . Smoking status: Former Smoker -- 0.50 packs/day for 10 years    Types: Cigarettes    Quit date: 07/19/2014  . Smokeless tobacco: Never Used  . Alcohol Use: 0.0 oz/week    0 Standard drinks or equivalent per week     Comment: 1 drink maybe every 3 months.  . Drug Use: No  . 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 Not on file   Social History Narrative   Married, 2 sons (1 in [place], and a 58 yo).  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   Quit smoking 06/2014    Family History  Problem  Relation Age of Onset  . Cancer Mother     died of leukemia  . Cancer Father     died of lung cancer  . Hypertension Father   . Depression Sister     bipolar  . Dementia Sister     related to self-inflected gunshot wound to head  . Hypertension Brother   . Hyperlipidemia Brother   . Heart disease Brother     CABG at 78  . Stroke Paternal Aunt   . Heart disease Paternal Uncle   . COPD Paternal Uncle   . Diabetes Maternal Aunt   . Colon cancer Neg Hx   . Cancer Paternal Grandmother     breast cancer  . Breast cancer Paternal Grandmother     in her 60's    Outpatient Encounter Prescriptions as of 02/02/2015  Medication Sig Note  . aspirin 81 MG tablet Take 81 mg by mouth daily.     Marland Kitchen atorvastatin  (LIPITOR) 80 MG tablet TAKE 1 TABLET (80 MG TOTAL) BY MOUTH DAILY.   . benazepril (LOTENSIN) 20 MG tablet Take 1 tablet (20 mg total) by mouth daily.   . Coenzyme Q10 (COQ10) 200 MG CAPS Take 1 tablet by mouth daily.   . hydrochlorothiazide (MICROZIDE) 12.5 MG capsule TAKE 1 CAPSULE (12.5 MG TOTAL) BY MOUTH DAILY.   Marland Kitchen KRILL OIL 1000 MG CAPS Take by mouth.   02/02/2015: sporadically  . Multiple Vitamin (MULTIVITAMIN) capsule Take 1 capsule by mouth daily.   02/02/2015: sporadically  . [DISCONTINUED] atorvastatin (LIPITOR) 80 MG tablet TAKE 1 TABLET (80 MG TOTAL) BY MOUTH DAILY.   . [DISCONTINUED] benazepril (LOTENSIN) 20 MG tablet TAKE 1 TABLET BY MOUTH DAILY.   . [DISCONTINUED] hydrochlorothiazide (MICROZIDE) 12.5 MG capsule TAKE 1 CAPSULE (12.5 MG TOTAL) BY MOUTH DAILY.   . [DISCONTINUED] varenicline (CHANTIX CONTINUING MONTH PAK) 1 MG tablet Take 1 tablet (1 mg total) by mouth 2 (two) times daily.    No facility-administered encounter medications on file as of 02/02/2015.    Allergies  Allergen Reactions  . Codeine Nausea And Vomiting  . Penicillins Nausea And Vomiting    ROS: The patient denies anorexia, fever, vision changes, decreased hearing, ear pain, sore throat, breast concerns, chest pain, palpitations, dizziness, syncope, dyspnea on exertion, cough, swelling, nausea, vomiting, diarrhea, constipation, abdominal pain, melena, hematochezia, indigestion/heartburn, hematuria, incontinence, dysuria, vaginal bleeding, discharge, odor or itch, genital lesions, joint pains (only back, neck, shoulders), numbness, tingling, weakness, tremor, suspicious skin lesions, depression, anxiety, abnormal bleeding/bruising, or enlarged lymph nodes.  mild hot flashes  +low back pain every morning. Doesn't radiate down the leg as much as it did last year. Pain is mostly in the middle of her lower back. Gets better during the day. New mattress didn't make a difference. Occasional sinus headache,  congestion Some slight stress incontinence with cough/sneeze (much improved since bladder sling). Gained 20# since her last physical last year.  PHYSICAL EXAM:   BP 150/84 mmHg  Pulse 72  Ht 5' 4.25" (1.632 m)  Wt 191 lb 3.2 oz (86.728 kg)  BMI 32.56 kg/m2 124/84 on repeat by MD  General Appearance:  Alert, cooperative, no distress, appears stated age   Head:  Normocephalic, without obvious abnormality, atraumatic   Eyes:  PERRL, conjunctiva/corneas clear, EOM's intact, fundi  benign   Ears:  Normal TM's and external ear canals   Nose:  Nares normal, mucosa mildly edematous, no erythema or purulence. Sinuses nontender.   Throat:  Lips, mucosa, and tongue  normal; teeth and gums normal   Neck:  Supple, no lymphadenopathy; thyroid: no enlargement/tenderness/nodules; no carotid  bruit or JVD.   Back:  Spine nontender, no curvature, ROM normal, no CVA tenderness   Lungs:  Clear to auscultation bilaterally without rales or ronchi;  respirations unlabored   Chest Wall:  No tenderness or deformity   Heart:  Regular rate and rhythm, S1 and S2 normal, no murmur, rub  or gallop   Breast Exam:  No tenderness, masses, or nipple discharge or inversion. No axillary lymphadenopathy   Abdomen:  Soft, non-tender, nondistended, normoactive bowel sounds,  no masses, no hepatosplenomegaly   Genitalia:  Normal external genitalia without lesions. BUS and vagina normal; No abnormal vaginal discharge. Uterus absent, adnexa not enlarged, nontender, no masses. Pap not performed.  Rectal:  Normal sphincter tone, no masses; heme negative stool   Extremities:  No clubbing, cyanosis or edema   Pulses:  2+ and symmetric all extremities   Skin:  Skin color, texture, turgor normal, no rashes or lesions. Very tan (just returned from [place], reports using sunscreen)  Lymph nodes:  Cervical, supraclavicular, and axillary nodes normal   Neurologic:  CNII-XII intact, normal  strength, sensation and gait; reflexes 2+ and symmetric throughout    Psych: Normal mood, affect, hygiene and grooming        ASSESSMENT/PLAN:  Annual physical exam - Plan: Visual acuity screening, POCT Urinalysis Dipstick, Lipid panel, Comprehensive metabolic panel, CBC with Differential/Platelet, TSH  Hyperlipidemia - fasting for labs today.  has missed some of her medications (up to 2x/week at times) - Plan: Lipid panel, Comprehensive metabolic panel, atorvastatin (LIPITOR) 80 MG tablet  Essential hypertension, benign - has been missing medications--change to evening dosing (and change HCTZ to am only if needed) - Plan: Comprehensive metabolic panel, benazepril (LOTENSIN) 20 MG tablet, hydrochlorothiazide (MICROZIDE) 12.5 MG capsule  Obesity (BMI 30-39.9) - weight gain since quitting smoking; counseled re: diet, exercise, portions  Weight gain - Plan: TSH  Hyperlipidemia - Plan: Lipid panel, Comprehensive metabolic panel, atorvastatin (LIPITOR) 80 MG tablet   Discussed monthly self breast exams and yearly mammograms after the age of 58; at least 30 minutes of aerobic activity at least 5 days/week, weight-bearing exercise at least 2x/wk; proper sunscreen use reviewed; healthy diet, including goals of calcium and vitamin D intake and alcohol recommendations (less than or equal to 1 drink/day) reviewed; regular seatbelt use; changing batteries in smoke detectors. Immunization recommendations discussed--yearly flu shots recommended. Colonoscopy recommendations reviewed--age 34   F/u 6 months, sooner based on labs.  Periodically check blood pressure and contact us if it is consistently >135-140/85-90.  Change taking your medications back to the evenings. If you are getting up to go to the bathroom due to taking the diuretic at night, then consider switching just that one to the morning, and keeping it in the bathroom so that you take it immediately when you wake  up

## 2015-02-02 NOTE — Patient Instructions (Addendum)
  HEALTH MAINTENANCE RECOMMENDATIONS:  It is recommended that you get at least 30 minutes of aerobic exercise at least 5 days/week (for weight loss, you may need as much as 60-90 minutes). This can be any activity that gets your heart rate up. This can be divided in 10-15 minute intervals if needed, but try and build up your endurance at least once a week.  Weight bearing exercise is also recommended twice weekly.  Eat a healthy diet with lots of vegetables, fruits and fiber.  "Colorful" foods have a lot of vitamins (ie green vegetables, tomatoes, red peppers, etc).  Limit sweet tea, regular sodas and alcoholic beverages, all of which has a lot of calories and sugar.  Up to 1 alcoholic drink daily may be beneficial for women (unless trying to lose weight, watch sugars).  Drink a lot of water.  Calcium recommendations are 1200-1500 mg daily (1500 mg for postmenopausal women or women without ovaries), and vitamin D 1000 IU daily.  This should be obtained from diet and/or supplements (vitamins), and calcium should not be taken all at once, but in divided doses.  Monthly self breast exams and yearly mammograms for women over the age of 80 is recommended.  Sunscreen of at least SPF 30 should be used on all sun-exposed parts of the skin when outside between the hours of 10 am and 4 pm (not just when at beach or pool, but even with exercise, golf, tennis, and yard work!)  Use a sunscreen that says "broad spectrum" so it covers both UVA and UVB rays, and make sure to reapply every 1-2 hours.  Remember to change the batteries in your smoke detectors when changing your clock times in the spring and fall.  Use your seat belt every time you are in a car, and please drive safely and not be distracted with cell phones and texting while driving.  Change taking your medications back to the evenings. If you are getting up to go to the bathroom due to taking the diuretic at night, then consider switching just that one  to the morning, and keeping it in the bathroom so that you take it immediately when you wake up.  Schedule your mammogram and eye doctor appointment.   Periodically check blood pressure and contact us if it is consistently >135-140/85-90.

## 2015-02-06 ENCOUNTER — Other Ambulatory Visit: Payer: Self-pay | Admitting: *Deleted

## 2015-02-06 DIAGNOSIS — E782 Mixed hyperlipidemia: Secondary | ICD-10-CM

## 2015-05-08 ENCOUNTER — Other Ambulatory Visit: Payer: Self-pay

## 2015-05-25 ENCOUNTER — Other Ambulatory Visit: Payer: Self-pay

## 2015-05-29 ENCOUNTER — Other Ambulatory Visit: Payer: Self-pay

## 2015-05-29 DIAGNOSIS — Z1231 Encounter for screening mammogram for malignant neoplasm of breast: Secondary | ICD-10-CM

## 2015-05-31 ENCOUNTER — Other Ambulatory Visit (INDEPENDENT_AMBULATORY_CARE_PROVIDER_SITE_OTHER): Payer: BLUE CROSS/BLUE SHIELD

## 2015-05-31 DIAGNOSIS — E782 Mixed hyperlipidemia: Secondary | ICD-10-CM

## 2015-05-31 DIAGNOSIS — Z23 Encounter for immunization: Secondary | ICD-10-CM | POA: Diagnosis not present

## 2015-06-01 ENCOUNTER — Other Ambulatory Visit: Payer: Self-pay | Admitting: *Deleted

## 2015-06-01 DIAGNOSIS — E785 Hyperlipidemia, unspecified: Secondary | ICD-10-CM

## 2015-06-01 LAB — LIPID PANEL
Cholesterol: 186 mg/dL (ref 125–200)
HDL: 38 mg/dL — ABNORMAL LOW (ref >=46)
LDL Cholesterol: 112 mg/dL (ref <130)
Total CHOL/HDL Ratio: 4.9 Ratio (ref <=5.0)
Triglycerides: 178 mg/dL — ABNORMAL HIGH (ref <150)
VLDL: 36 mg/dL — ABNORMAL HIGH (ref <30)

## 2015-06-01 MED ORDER — ATORVASTATIN CALCIUM 80 MG PO TABS: ORAL_TABLET | ORAL | Status: DC

## 2015-07-06 ENCOUNTER — Ambulatory Visit
Admission: RE | Admit: 2015-07-06 | Discharge: 2015-07-06 | Disposition: A | Discharge status: Home / Self Care | Payer: BLUE CROSS/BLUE SHIELD | Source: Ambulatory Visit

## 2015-07-06 DIAGNOSIS — Z1231 Encounter for screening mammogram for malignant neoplasm of breast: Secondary | ICD-10-CM

## 2015-08-09 ENCOUNTER — Encounter: Payer: BLUE CROSS/BLUE SHIELD | Admitting: Family Medicine

## 2015-08-30 ENCOUNTER — Telehealth: Payer: Self-pay | Admitting: Family Medicine

## 2015-08-30 DIAGNOSIS — I10 Essential (primary) hypertension: Secondary | ICD-10-CM

## 2015-08-30 MED ORDER — BENAZEPRIL HCL 20 MG PO TABS: 20.0000 mg | ORAL_TABLET | Freq: Every day | ORAL | Status: DC

## 2015-08-30 MED ORDER — HYDROCHLOROTHIAZIDE 12.5 MG PO CAPS: ORAL_CAPSULE | ORAL | Status: DC

## 2015-08-30 NOTE — Telephone Encounter (Signed)
Phone in

## 2015-08-30 NOTE — Telephone Encounter (Signed)
Rcvd refill request (with note stating that there is a temp network outage with electronic refill submission) Benazepril 20mg  #90 and Hydrochlorothiazide 12.5mg  #90

## 2015-09-11 ENCOUNTER — Encounter: Payer: Self-pay | Admitting: Family Medicine

## 2015-09-11 ENCOUNTER — Ambulatory Visit (INDEPENDENT_AMBULATORY_CARE_PROVIDER_SITE_OTHER): Payer: BLUE CROSS/BLUE SHIELD | Admitting: Family Medicine

## 2015-09-11 VITALS — BP 120/80 | HR 72 | Ht 62.25 in | Wt 193.8 lb

## 2015-09-11 DIAGNOSIS — J069 Acute upper respiratory infection, unspecified: Secondary | ICD-10-CM

## 2015-09-11 DIAGNOSIS — I10 Essential (primary) hypertension: Secondary | ICD-10-CM | POA: Diagnosis not present

## 2015-09-11 DIAGNOSIS — E785 Hyperlipidemia, unspecified: Secondary | ICD-10-CM | POA: Diagnosis not present

## 2015-09-11 DIAGNOSIS — IMO0001 Reserved for inherently not codable concepts without codable children: Secondary | ICD-10-CM | POA: Insufficient documentation

## 2015-09-11 MED ORDER — ATORVASTATIN CALCIUM 80 MG PO TABS: ORAL_TABLET | ORAL | Status: DC

## 2015-09-11 NOTE — Progress Notes (Signed)
Chief Complaint  Patient presents with  . Hypertension    fasting med check.   She is complaining of some sinus drainage for the last few days.  It was bloody, had been clear prior.  No fever, chills, otherwise feels pretty good.  Denies headache. Hasn't taken any OTC meds recently. She last had a cold a few weeks ago.   Patient presents to follow up on hypertension. She has not been checking BP elsewhere.  She currently denies headaches, dizziness, edema, muscle cramps. Compliant with taking medications daily, taking them all in the evening.  Up once or twice to void at night, tolerable, can get back to sleep.  Hyperlipidemia follow-up: Patient is reportedly following a low-fat, low cholesterol diet. She has been compliant with taking lipitor (just 2 doses missed recently, waiting for refill); admits to being sporadic with krill oil, none in the last couple of weeks since she ran out. Denies side effects from the statin.  Lab Results  Component Value Date   CHOL 186 05/31/2015   HDL 38* 05/31/2015   LDLCALC 112 05/31/2015   TRIG 178* 05/31/2015   CHOLHDL 4.9 05/31/2015   Lipids had been MUCH higher in July, when very sporadic with Lipitor use.  She is compliant with medication since switching taking all meds in the evening.  H/o Tobacco Abuse: She quit smoking on 12/29 using Chantix. She only used it for 4-6 weeks.She has gained 20# since she quit smoking. She gained an additional 2.6# over the last 6 months.  Exercise: short walks 2x/day with the dogs (slow, not aerobic). +regular soda, daily.  PMH, PSH, SH and FH reviewed/updated.  Outpatient Encounter Prescriptions as of 09/11/2015  Medication Sig Note  . aspirin 81 MG tablet Take 81 mg by mouth daily.     Marland Kitchen atorvastatin (LIPITOR) 80 MG tablet TAKE 1 TABLET (80 MG TOTAL) BY MOUTH DAILY.   . benazepril (LOTENSIN) 20 MG tablet Take 1 tablet (20 mg total) by mouth daily.   . Coenzyme Q10 (COQ10) 200 MG CAPS Take 1 tablet by  mouth daily.   . hydrochlorothiazide (MICROZIDE) 12.5 MG capsule TAKE 1 CAPSULE (12.5 MG TOTAL) BY MOUTH DAILY.   Marland Kitchen KRILL OIL 1000 MG CAPS Take by mouth.   02/02/2015: sporadically  . Multiple Vitamin (MULTIVITAMIN) capsule Take 1 capsule by mouth daily.   02/02/2015: sporadically   No facility-administered encounter medications on file as of 09/11/2015.   Allergies  Allergen Reactions  . Codeine Nausea And Vomiting  . Penicillins Nausea And Vomiting   ROS: no fever, chills, headaches, chest pain, palpitations, shortness of breath, edema, nausea, vomiting, diarrhea.  +runny nose, sinus drainage and pressure on either side of the nose.  Denies ear pain, sore throat, bleeding, bruising, rash, urinary complaints or other concerns except as noted in HPI.  PHYSICAL EXAM: BP 150/90 mmHg  Pulse 72  Ht 5' 2.25" (1.581 m)  Wt 193 lb 12.8 oz (87.907 kg)  BMI 35.17 kg/m2  120/80 on repeat by MD, RA  Well developed, pleasant female in no distress HEENT: PERRL, EOMI, conjunctiva clear. TM's and EAC's normal. Nasal mucosa is mild-mod edematous, with some white drainage and mild erythema. Sinuses are nontender. OP is clear Neck: no lymphadenopathy, thyromegaly or mass Heart: regular rate and rhythm Lungs: clear bilaterally Abdomen: soft, nontender, no organomegaly or mass Extremities: no edema Psych: normal mood, affect Neuro: alert and oriented, cranial nerves intact. Normal gait, strength  ASSESSMENT/PLAN:  Hyperlipidemia - labs borderline in November; continue  current meds, restart krill oil, increase exercise. recheck at CPE - Plan: atorvastatin (LIPITOR) 80 MG tablet  Essential hypertension, benign - well controlled  Obesity, Class II, BMI 35-39.9, with comorbidity ([place]) - counseled re: diet, exercise in detail  Acute upper respiratory infection - supportive measures reviewed.  s/sx of infection reviewed (call if symptoms persist/worsen)   Lipids-Compliant with medications.  Last  lipids in November were improved.  HDL had improved some.  Hoping exercise will help, as well as further improvement since quitting smoking. TG were elevated--should improve as she cuts back on sugar in her diet (ie regular sodas).  Counseled extensively re: diet, portions, healthy snacks, mindful eating, caloric beverages and exercise.  F/u in 6 months for CPE (fasting)

## 2015-09-11 NOTE — Patient Instructions (Signed)
Continue your current medications. Your blood pressure is well controlled. Try and continue to take the omega-3 capsules--get some more, and take it regularly in the evening.  Try and cut back (and eventually eliminate) regular sodas. Try diet sodas, and taper back some if you don't need the caffeine., Try and drink more water. We discussed mindful eating and portion control. We discussed the app MyFitnessPal. We discussed exercise. Try and make a few changes at a time (rather than feeling overwhelmed with changing everything at once).  It is recommended that you get at least 30 minutes of aerobic exercise at least 5 days/week (for weight loss, you may need as much as 60-90 minutes). This can be any activity that gets your heart rate up. This can be divided in 10-15 minute intervals if needed, but try and build up your endurance at least once a week.  Weight bearing exercise is also recommended twice weekly.

## 2015-11-26 ENCOUNTER — Other Ambulatory Visit: Payer: Self-pay | Admitting: Family Medicine

## 2016-04-18 ENCOUNTER — Encounter: Payer: BLUE CROSS/BLUE SHIELD | Admitting: Family Medicine

## 2016-05-16 ENCOUNTER — Other Ambulatory Visit: Payer: Self-pay | Admitting: Family Medicine

## 2016-05-16 NOTE — Telephone Encounter (Signed)
Needs med check now. She is either out of her atorvastatin, or not taking properly.  Her BP meds were last filled 5/8 for #90.  Suspect noncompliance.  Refill #30 and schedule OV for med check soon.

## 2016-05-16 NOTE — Telephone Encounter (Signed)
Is this okay to refill? Her last appt was 09/11/15, has appt scheduled for 04/18/16 but canceled-rescheduled for 11/06/16 for CPE.

## 2016-05-17 NOTE — Telephone Encounter (Signed)
Tried to call pt but vm is not set up. I will refill for 30 days ONLY!!! PT MUST HAVE AN APPT before anymore refills

## 2016-06-11 ENCOUNTER — Other Ambulatory Visit: Payer: Self-pay | Admitting: Family Medicine

## 2016-06-11 DIAGNOSIS — E785 Hyperlipidemia, unspecified: Secondary | ICD-10-CM

## 2016-06-23 ENCOUNTER — Other Ambulatory Visit: Payer: Self-pay | Admitting: Family Medicine

## 2016-10-31 ENCOUNTER — Other Ambulatory Visit: Payer: Self-pay | Admitting: Family Medicine

## 2016-10-31 DIAGNOSIS — Z1231 Encounter for screening mammogram for malignant neoplasm of breast: Secondary | ICD-10-CM

## 2016-11-01 ENCOUNTER — Other Ambulatory Visit: Payer: Self-pay | Admitting: Family Medicine

## 2016-11-05 NOTE — Progress Notes (Signed)
Chief Complaint  Patient presents with  . Annual Exam    fasting annual exam with pelvic, no eye exam just had one. No concerns.     Sherry Huff is a 51 y.o. female who presents for a complete physical and follow up on chronic conditions.  She has no specific complaints today.  Patient presents to follow up on hypertension. She has not been checking BP elsewhere (infrequently checks, can't recall the values). She currently denies headaches, dizziness, edema, muscle cramps. Compliant with taking medications daily, taking them all in the evening.  Up once or twice to void at night, tolerable, can get back to sleep.  Hyperlipidemia follow-up: Patient is reportedly following a low-fat, low cholesterol diet. She has been compliant with taking lipitor.  Denies side effects from the statin.  Also takes Krill oil once daily. Last lipids: Lab Results  Component Value Date   CHOL 186 05/31/2015   HDL 38 (L) 05/31/2015   LDLCALC 112 05/31/2015   TRIG 178 (H) 05/31/2015   CHOLHDL 4.9 05/31/2015    H/o Tobacco Abuse: She quit smoking 06/2014 using Chantix. She only used it for 4-6 weeks.She gained 20# after she quit smoking, hasn't lost it yet.  She continues to drink regular soda, daily--2L of Dr. Malachi Bonds.  Immunization History  Administered Date(s) Administered  . Influenza Split 06/04/2005, 06/19/2006, 04/25/2008  . Influenza,inj,Quad PF,36+ Mos 08/01/2014, 05/31/2015  . Pneumococcal Polysaccharide-23 10/19/2012  . Td 03/06/1995, 06/04/2005  . Tdap 10/19/2012   Flu shot--didn't get one this past year. Last Pap smear: 11/2009; no abnormal paps; had hysterectomy for benign reasons, no cervix Last mammogram: 06/2015, scheduled for next month. Last colonoscopy: never  Last DEXA: never  Dentist: regularly, every 3-4 months. Ophtho: went in December.  She reports she was told she had some elevated pressures, but no damage was seen, and wasn't put on treatment. Exercise: Walks 1  mile day at least 5-6 times/week.  20 minutes. No weight-bearing exercise. Vitamin D-OH screen--level 41 in 09/2012  Past Medical History:  Diagnosis Date  . Hyperlipidemia   . Hypertension   . Tobacco use disorder    quit 07/19/2014    Past Surgical History:  Procedure Laterality Date  . ABDOMINAL HYSTERECTOMY  2010   partial (with bladder sling); benign  . BUNIONECTOMY     left  . CESAREAN SECTION     x2  . GANGLION CYST EXCISION     left arm  . INCONTINENCE SURGERY  2010   Dr. Philis Pique    Social History   Social History  . Marital status: Married    Spouse name: N/A  . Number of children: 2  . Years of education: N/A   Occupational History  . analyst  [employer]   Social History Main Topics  . Smoking status: Former Smoker    Packs/day: 0.50    Years: 10.00    Types: Cigarettes    Quit date: 07/19/2014  . Smokeless tobacco: Never Used  . Alcohol use 0.0 oz/week     Comment: 1 drink once or twice a month   . Drug use: No  . 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 Not on file   Social History Narrative   Married, 2 sons (1 in [place], and a 2 yo).  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   Quit smoking 06/2014    Family History  Problem Relation Age of Onset  . Cancer Mother     died of leukemia  . Cancer Father     died of lung cancer  . Hypertension Father   . Depression Sister     bipolar  . Dementia Sister     related to self-inflected gunshot wound to head  . Hypertension Brother   . Hyperlipidemia Brother   . Heart disease Brother     CABG at 27  . Stroke Paternal Aunt   . Heart disease Paternal Uncle   . COPD Paternal Uncle   . Diabetes Maternal Aunt   . Cancer Paternal Grandmother     breast cancer  . Breast cancer Paternal Grandmother     in her 39's  . Colon cancer Neg Hx      Outpatient Encounter Prescriptions as of 11/06/2016  Medication Sig Note  . aspirin 81 MG tablet Take 81 mg by mouth daily.     Marland Kitchen atorvastatin (LIPITOR) 80 MG tablet TAKE 1 TABLET (80 MG TOTAL) BY MOUTH DAILY.   . benazepril (LOTENSIN) 20 MG tablet TAKE 1 TABLET (20 MG TOTAL) BY MOUTH DAILY.   . hydrochlorothiazide (MICROZIDE) 12.5 MG capsule TAKE 1 CAPSULE (12.5 MG TOTAL) BY MOUTH DAILY.   Marland Kitchen KRILL OIL 1000 MG CAPS Take by mouth.   11/06/2016: daily  . [DISCONTINUED] benazepril (LOTENSIN) 20 MG tablet TAKE 1 TABLET (20 MG TOTAL) BY MOUTH DAILY.   . [DISCONTINUED] Coenzyme Q10 (COQ10) 200 MG CAPS Take 1 tablet by mouth daily.   . [DISCONTINUED] hydrochlorothiazide (MICROZIDE) 12.5 MG capsule TAKE 1 CAPSULE (12.5 MG TOTAL) BY MOUTH DAILY.   . Multiple Vitamin (MULTIVITAMIN) capsule Take 1 capsule by mouth daily.   11/06/2016: Hasn't taken in a while   No facility-administered encounter medications on file as of 11/06/2016.     Allergies  Allergen Reactions  . Codeine Nausea And Vomiting  . Penicillins Nausea And Vomiting   ROS: The patient denies anorexia, fever, vision changes, decreased hearing, ear pain, sore throat, breast concerns, chest pain, palpitations, dizziness, syncope, dyspnea on exertion, cough, swelling, nausea, vomiting, diarrhea, constipation, abdominal pain, melena, hematochezia, indigestion/heartburn, hematuria, incontinence, dysuria, vaginal bleeding, discharge, odor or itch, genital lesions, joint pains (only back, neck, shoulders, much less than in the past), numbness, tingling, weakness, tremor, suspicious skin lesions, depression, anxiety, abnormal bleeding/bruising, or enlarged lymph nodes.  mild hot flashes, slight night sweats (not drenching) +low back pain every morning, or with prolonged sitting.  Feels better when moving. No radiation into the legs. Occasional sinus headache, congestion.  Chronic very slight cough. Some slight stress incontinence with  cough/sneeze (much improved since bladder sling). Weight change--no significant change in the last year, slight loss.  Some gas and intermittent epigastric discomfort, which she relates to certain foods. Beano helps. +fatigue.  Knows that she snores, doesn't know about apnea.  Intermittently has some daytime fatigue, needs naps, but has improved.    PHYSICAL EXAM:  BP 124/88 (BP Location: Left Arm, Patient Position: Sitting, Cuff Size: Normal)   Pulse 72   Ht 5' 3"  (1.6 m)   Wt 192 lb (87.1 kg)   BMI 34.01 kg/m    114/78 on repeat by MD   Wt Readings from Last 3 Encounters:  09/11/15 193 lb 12.8 oz (87.9 kg)  02/02/15 191 lb 3.2 oz (86.7 kg)  08/01/14 179 lb (81.2 kg)   General Appearance:  Alert,  cooperative, no distress, appears stated age   Head:  Normocephalic, without obvious abnormality, atraumatic   Eyes:  PERRL, conjunctiva/corneas clear, EOM's intact, fundi  benign   Ears:  Normal TM's and external ear canals   Nose:  Nares normal, mucosa mildly edematous, no erythema or purulence. Sinuses nontender.   Throat:  Lips, mucosa, and tongue normal; teeth and gums normal   Neck:  Supple, no lymphadenopathy; thyroid: no enlargement/tenderness/nodules; no carotid bruit or JVD.   Back:  Spine nontender, no curvature, ROM normal, no CVA tenderness. Slightly tender upper sacrum and R SI joint   Lungs:  Clear to auscultation bilaterally without rales or ronchi;  respirations unlabored   Chest Wall:  No tenderness or deformity   Heart:  Regular rate and rhythm, S1 and S2 normal, no murmur, rub or gallop   Breast Exam:  No tenderness, masses, or nipple discharge or inversion. No axillary lymphadenopathy   Abdomen:  Soft, non-tender, nondistended, normoactive bowel sounds, no masses, no hepatosplenomegaly   Genitalia:  Normal external genitalia without lesions. BUS and vagina normal; No abnormal vaginal discharge. Uterus absent, adnexa not enlarged,  nontender, no masses. Pap not performed.  Rectal:  Normal sphincter tone, no masses; heme negative stool   Extremities:  No clubbing, cyanosis or edema   Pulses:  2+ and symmetric all extremities   Skin:  Skin color, texture, turgor normal, no rashes or lesions.  Lymph nodes:  Cervical, supraclavicular, and axillary nodes normal   Neurologic:  CNII-XII intact, normal strength, sensation and gait; reflexes 2+ and symmetric throughout    Psych: Normal mood, affect, hygiene and grooming   ASSESSMENT/PLAN:  Annual physical exam - Plan: POCT Urinalysis Dipstick, Lipid panel, Comprehensive metabolic panel, CBC with Differential/Platelet, VITAMIN D 25 Hydroxy (Vit-D Deficiency, Fractures), TSH  Essential hypertension, benign - controlled - Plan: benazepril (LOTENSIN) 20 MG tablet, hydrochlorothiazide (MICROZIDE) 12.5 MG capsule  Class 1 obesity due to excess calories with body mass index (BMI) of 30.0 to 30.9 in adult, unspecified whether serious comorbidity present - counseled re: diet, exercise, weight loss, risks of obesity. Start with cutting out 2L of regular sodas daily.  Hyperlipidemia, unspecified hyperlipidemia type - Plan: Lipid panel  Colon cancer screening - Plan: Ambulatory referral to Gastroenterology  Medication monitoring encounter - Plan: Lipid panel, Comprehensive metabolic panel  Other fatigue - to ask family about apnea; discussed OSA and risks.  encouraged weight loss - Plan: Comprehensive metabolic panel, CBC with Differential/Platelet, VITAMIN D 25 Hydroxy (Vit-D Deficiency, Fractures), TSH   c-met, lipids, CBC, TSH, Vit D (recheck since not taking vitamins regularly) Refer for colonoscopy shingrix recommended--check insurance  Ask family about snoring and apnea.  Weight loss encouraged.  Let us know if they say you do have those pauses in your breathing when sleeping, so we can set up a sleep study for you.  Discussed monthly self  breast exams and yearly mammograms; at least 30 minutes of aerobic activity at least 5 days/week, weight-bearing exercise at least 2x/wk; proper sunscreen use reviewed; healthy diet, including goals of calcium and vitamin D intake and alcohol recommendations (less than or equal to 1 drink/day) reviewed; regular seatbelt use; changing batteries in smoke detectors. Immunization recommendations discussed--yearly flu shots recommended. Shingrix recommended. Colonoscopy recommendations reviewed--referred for colonoscopy.

## 2016-11-06 ENCOUNTER — Ambulatory Visit (INDEPENDENT_AMBULATORY_CARE_PROVIDER_SITE_OTHER): Payer: BLUE CROSS/BLUE SHIELD | Admitting: Family Medicine

## 2016-11-06 ENCOUNTER — Encounter: Payer: Self-pay | Admitting: Family Medicine

## 2016-11-06 VITALS — BP 114/78 | HR 72 | Ht 63.0 in | Wt 192.0 lb

## 2016-11-06 DIAGNOSIS — Z6830 Body mass index (BMI) 30.0-30.9, adult: Secondary | ICD-10-CM | POA: Diagnosis not present

## 2016-11-06 DIAGNOSIS — E785 Hyperlipidemia, unspecified: Secondary | ICD-10-CM | POA: Diagnosis not present

## 2016-11-06 DIAGNOSIS — Z0000 Encounter for general adult medical examination without abnormal findings: Secondary | ICD-10-CM | POA: Diagnosis not present

## 2016-11-06 DIAGNOSIS — Z1211 Encounter for screening for malignant neoplasm of colon: Secondary | ICD-10-CM | POA: Diagnosis not present

## 2016-11-06 DIAGNOSIS — Z5181 Encounter for therapeutic drug level monitoring: Secondary | ICD-10-CM | POA: Diagnosis not present

## 2016-11-06 DIAGNOSIS — E6609 Other obesity due to excess calories: Secondary | ICD-10-CM | POA: Diagnosis not present

## 2016-11-06 DIAGNOSIS — I10 Essential (primary) hypertension: Secondary | ICD-10-CM | POA: Diagnosis not present

## 2016-11-06 DIAGNOSIS — R5383 Other fatigue: Secondary | ICD-10-CM | POA: Diagnosis not present

## 2016-11-06 LAB — LIPID PANEL
Cholesterol: 173 mg/dL (ref <200)
HDL: 34 mg/dL — ABNORMAL LOW (ref >50)
LDL Cholesterol: 84 mg/dL (ref <100)
Total CHOL/HDL Ratio: 5.1 Ratio — ABNORMAL HIGH (ref <5.0)
Triglycerides: 274 mg/dL — ABNORMAL HIGH (ref <150)
VLDL: 55 mg/dL — ABNORMAL HIGH (ref <30)

## 2016-11-06 LAB — POCT URINALYSIS DIPSTICK
Bilirubin, UA: NEGATIVE
Glucose, UA: NEGATIVE
Ketones, UA: NEGATIVE
Leukocytes, UA: NEGATIVE
Nitrite, UA: NEGATIVE
Protein, UA: NEGATIVE
Spec Grav, UA: 1.01 (ref 1.010–1.025)
Urobilinogen, UA: NEGATIVE E.U./dL — AB
pH, UA: 7.5 (ref 5.0–8.0)

## 2016-11-06 LAB — CBC WITH DIFFERENTIAL/PLATELET
Basophils Absolute: 0 cells/uL (ref 0–200)
Basophils Relative: 0 %
Eosinophils Absolute: 352 cells/uL (ref 15–500)
Eosinophils Relative: 4 %
HCT: 43.9 % (ref 35.0–45.0)
Hemoglobin: 14.9 g/dL (ref 11.7–15.5)
Lymphocytes Relative: 37 %
Lymphs Abs: 3256 cells/uL (ref 850–3900)
MCH: 29.2 pg (ref 27.0–33.0)
MCHC: 33.9 g/dL (ref 32.0–36.0)
MCV: 85.9 fL (ref 80.0–100.0)
MPV: 10.2 fL (ref 7.5–12.5)
Monocytes Absolute: 528 cells/uL (ref 200–950)
Monocytes Relative: 6 %
Neutro Abs: 4664 cells/uL (ref 1500–7800)
Neutrophils Relative %: 53 %
Platelets: 314 10*3/uL (ref 140–400)
RBC: 5.11 MIL/uL — ABNORMAL HIGH (ref 3.80–5.10)
RDW: 13.7 % (ref 11.0–15.0)
WBC: 8.8 10*3/uL (ref 4.0–10.5)

## 2016-11-06 LAB — COMPREHENSIVE METABOLIC PANEL
ALT: 13 U/L (ref 6–29)
AST: 15 U/L (ref 10–35)
Albumin: 4.6 g/dL (ref 3.6–5.1)
Alkaline Phosphatase: 80 U/L (ref 33–130)
BUN: 11 mg/dL (ref 7–25)
CO2: 28 mmol/L (ref 20–31)
Calcium: 9.8 mg/dL (ref 8.6–10.4)
Chloride: 101 mmol/L (ref 98–110)
Creat: 0.88 mg/dL (ref 0.50–1.05)
Glucose, Bld: 94 mg/dL (ref 65–99)
Potassium: 3.3 mmol/L — ABNORMAL LOW (ref 3.5–5.3)
Sodium: 140 mmol/L (ref 135–146)
Total Bilirubin: 0.8 mg/dL (ref 0.2–1.2)
Total Protein: 7.3 g/dL (ref 6.1–8.1)

## 2016-11-06 LAB — TSH: TSH: 2.83 mIU/L

## 2016-11-06 MED ORDER — HYDROCHLOROTHIAZIDE 12.5 MG PO CAPS: ORAL_CAPSULE | ORAL | 3 refills | Status: DC

## 2016-11-06 MED ORDER — BENAZEPRIL HCL 20 MG PO TABS: ORAL_TABLET | ORAL | 3 refills | Status: DC

## 2016-11-06 NOTE — Patient Instructions (Addendum)
HEALTH MAINTENANCE RECOMMENDATIONS:  It is recommended that you get at least 30 minutes of aerobic exercise at least 5 days/week (for weight loss, you may need as much as 60-90 minutes). This can be any activity that gets your heart rate up. This can be divided in 10-15 minute intervals if needed, but try and build up your endurance at least once a week.  Weight bearing exercise is also recommended twice weekly.  Eat a healthy diet with lots of vegetables, fruits and fiber.  "Colorful" foods have a lot of vitamins (ie green vegetables, tomatoes, red peppers, etc).  Limit sweet tea, regular sodas and alcoholic beverages, all of which has a lot of calories and sugar.  Up to 1 alcoholic drink daily may be beneficial for women (unless trying to lose weight, watch sugars).  Drink a lot of water.  Calcium recommendations are 1200-1500 mg daily (1500 mg for postmenopausal women or women without ovaries), and vitamin D 1000 IU daily.  This should be obtained from diet and/or supplements (vitamins), and calcium should not be taken all at once, but in divided doses.  Monthly self breast exams and yearly mammograms for women over the age of 3 is recommended.  Sunscreen of at least SPF 30 should be used on all sun-exposed parts of the skin when outside between the hours of 10 am and 4 pm (not just when at beach or pool, but even with exercise, golf, tennis, and yard work!)  Use a sunscreen that says "broad spectrum" so it covers both UVA and UVB rays, and make sure to reapply every 1-2 hours.  Remember to change the batteries in your smoke detectors when changing your clock times in the spring and fall.  Use your seat belt every time you are in a car, and please drive safely and not be distracted with cell phones and texting while driving.   HEALTH MAINTENANCE RECOMMENDATIONS:  It is recommended that you get at least 30 minutes of aerobic exercise at least 5 days/week (for weight loss, you may need as much  as 60-90 minutes). This can be any activity that gets your heart rate up. This can be divided in 10-15 minute intervals if needed, but try and build up your endurance at least once a week.  Weight bearing exercise is also recommended twice weekly.  Eat a healthy diet with lots of vegetables, fruits and fiber.  "Colorful" foods have a lot of vitamins (ie green vegetables, tomatoes, red peppers, etc).  Limit sweet tea, regular sodas and alcoholic beverages, all of which has a lot of calories and sugar.  Up to 1 alcoholic drink daily may be beneficial for women (unless trying to lose weight, watch sugars).  Drink a lot of water.  Calcium recommendations are 1200-1500 mg daily (1500 mg for postmenopausal women or women without ovaries), and vitamin D 1000 IU daily.  This should be obtained from diet and/or supplements (vitamins), and calcium should not be taken all at once, but in divided doses.  Monthly self breast exams and yearly mammograms for women over the age of 51 is recommended.  Sunscreen of at least SPF 30 should be used on all sun-exposed parts of the skin when outside between the hours of 10 am and 4 pm (not just when at beach or pool, but even with exercise, golf, tennis, and yard work!)  Use a sunscreen that says "broad spectrum" so it covers both UVA and UVB rays, and make sure to reapply every 1-2 hours.  Remember to change the batteries in your smoke detectors when changing your clock times in the spring and fall.  Use your seat belt every time you are in a car, and please drive safely and not be distracted with cell phones and texting while driving.  I recommend getting the new shingles vaccine (Shingrix). You will need to check with your insurance to see if it is covered.  It is a series of 2 injections, spaced 2 months apart.   Ask family about snoring and apnea.  Weight loss encouraged.  Let us know if they say you do have those pauses in your breathing when sleeping, so we can set  up a sleep study for you.

## 2016-11-07 ENCOUNTER — Encounter: Payer: Self-pay | Admitting: Family Medicine

## 2016-11-07 ENCOUNTER — Other Ambulatory Visit: Payer: Self-pay | Admitting: *Deleted

## 2016-11-07 DIAGNOSIS — E782 Mixed hyperlipidemia: Secondary | ICD-10-CM

## 2016-11-07 DIAGNOSIS — E876 Hypokalemia: Secondary | ICD-10-CM | POA: Insufficient documentation

## 2016-11-07 DIAGNOSIS — E559 Vitamin D deficiency, unspecified: Secondary | ICD-10-CM

## 2016-11-07 LAB — VITAMIN D 25 HYDROXY (VIT D DEFICIENCY, FRACTURES): Vit D, 25-Hydroxy: 17 ng/mL — ABNORMAL LOW (ref 30–100)

## 2016-11-07 MED ORDER — ERGOCALCIFEROL 1.25 MG (50000 UT) PO CAPS: 50000.0000 [IU] | ORAL_CAPSULE | ORAL | 0 refills | Status: DC

## 2016-11-07 MED ORDER — ROSUVASTATIN CALCIUM 40 MG PO TABS: 40.0000 mg | ORAL_TABLET | Freq: Every day | ORAL | 0 refills | Status: DC

## 2016-12-04 ENCOUNTER — Ambulatory Visit: Payer: BLUE CROSS/BLUE SHIELD

## 2016-12-05 ENCOUNTER — Other Ambulatory Visit: Payer: Self-pay | Admitting: Family Medicine

## 2016-12-05 DIAGNOSIS — E785 Hyperlipidemia, unspecified: Secondary | ICD-10-CM

## 2016-12-20 ENCOUNTER — Other Ambulatory Visit: Payer: Self-pay | Admitting: Family Medicine

## 2017-01-24 ENCOUNTER — Encounter: Payer: Self-pay | Admitting: Gastroenterology

## 2017-02-06 ENCOUNTER — Other Ambulatory Visit: Payer: Self-pay

## 2017-03-10 ENCOUNTER — Other Ambulatory Visit: Payer: Self-pay | Admitting: Family Medicine

## 2017-03-11 ENCOUNTER — Ambulatory Visit (AMBULATORY_SURGERY_CENTER): Payer: Self-pay | Admitting: *Deleted

## 2017-03-11 VITALS — Ht 64.0 in | Wt 195.4 lb

## 2017-03-11 DIAGNOSIS — Z1211 Encounter for screening for malignant neoplasm of colon: Secondary | ICD-10-CM

## 2017-03-11 MED ORDER — NA SULFATE-K SULFATE-MG SULF 17.5-3.13-1.6 GM/177ML PO SOLN: 1.0000 [IU] | Freq: Once | ORAL | 0 refills | Status: AC

## 2017-03-11 NOTE — Progress Notes (Signed)
No egg or soy allergy known to patient  No issues with past sedation with any surgeries  or procedures, no intubation problems  No diet pills per patient No home 02 use per patient  No blood thinners per patient  Pt denies issues with constipation  No A fib or A flutter  EMMI video sent to pt's e mail  

## 2017-03-22 DIAGNOSIS — K635 Polyp of colon: Secondary | ICD-10-CM

## 2017-03-22 HISTORY — DX: Polyp of colon: K63.5

## 2017-03-25 ENCOUNTER — Ambulatory Visit (AMBULATORY_SURGERY_CENTER): Payer: BLUE CROSS/BLUE SHIELD | Admitting: Gastroenterology

## 2017-03-25 ENCOUNTER — Encounter: Payer: Self-pay | Admitting: Gastroenterology

## 2017-03-25 VITALS — BP 121/82 | HR 68 | Temp 98.0°F | Resp 17 | Ht 63.0 in | Wt 192.0 lb

## 2017-03-25 DIAGNOSIS — K621 Rectal polyp: Secondary | ICD-10-CM

## 2017-03-25 DIAGNOSIS — D128 Benign neoplasm of rectum: Secondary | ICD-10-CM

## 2017-03-25 DIAGNOSIS — D123 Benign neoplasm of transverse colon: Secondary | ICD-10-CM

## 2017-03-25 DIAGNOSIS — Z1211 Encounter for screening for malignant neoplasm of colon: Secondary | ICD-10-CM | POA: Diagnosis present

## 2017-03-25 DIAGNOSIS — D129 Benign neoplasm of anus and anal canal: Secondary | ICD-10-CM

## 2017-03-25 MED ORDER — SODIUM CHLORIDE 0.9 % IV SOLN: 500.0000 mL | INTRAVENOUS | Status: AC

## 2017-03-25 NOTE — Progress Notes (Signed)
Report given to PACU, vss 

## 2017-03-25 NOTE — Progress Notes (Signed)
Called to room to assist during endoscopic procedure.  Patient ID and intended procedure confirmed with present staff. Received instructions for my participation in the procedure from the performing physician.  

## 2017-03-25 NOTE — Patient Instructions (Signed)
Impression/Recommendations:  Polyp handout given to patient.  Resume previous diet. Continue present medications.  No aspirin, ibuprofen, naproxen, or other NSAID drugs for 5 days.  Tylenol only until Sept. 10, 2018.  Repeat colonoscopy recommended for surveillance.  Date to be determined after pathology results are reviewed.  YOU HAD AN ENDOSCOPIC PROCEDURE TODAY AT [facility]:   Refer to the procedure report that was given to you for any specific questions about what was found during the examination.  If the procedure report does not answer your questions, please call your gastroenterologist to clarify.  If you requested that your care partner not be given the details of your procedure findings, then the procedure report has been included in a sealed envelope for you to review at your convenience later.  YOU SHOULD EXPECT: Some feelings of bloating in the abdomen. Passage of more gas than usual.  Walking can help get rid of the air that was put into your GI tract during the procedure and reduce the bloating. If you had a lower endoscopy (such as a colonoscopy or flexible sigmoidoscopy) you may notice spotting of blood in your stool or on the toilet paper. If you underwent a bowel prep for your procedure, you may not have a normal bowel movement for a few days.  Please Note:  You might notice some irritation and congestion in your nose or some drainage.  This is from the oxygen used during your procedure.  There is no need for concern and it should clear up in a day or so.  SYMPTOMS TO REPORT IMMEDIATELY:   Following lower endoscopy (colonoscopy or flexible sigmoidoscopy):  Excessive amounts of blood in the stool  Significant tenderness or worsening of abdominal pains  Swelling of the abdomen that is new, acute  Fever of 100F or higher For urgent or emergent issues, a gastroenterologist can be reached at any hour by calling [phone].   DIET:  We do recommend a  small meal at first, but then you may proceed to your regular diet.  Drink plenty of fluids but you should avoid alcoholic beverages for 24 hours.  ACTIVITY:  You should plan to take it easy for the rest of today and you should NOT DRIVE or use heavy machinery until tomorrow (because of the sedation medicines used during the test).    FOLLOW UP: Our staff will call the number listed on your records the next business day following your procedure to check on you and address any questions or concerns that you may have regarding the information given to you following your procedure. If we do not reach you, we will leave a message.  However, if you are feeling well and you are not experiencing any problems, there is no need to return our call.  We will assume that you have returned to your regular daily activities without incident.  If any biopsies were taken you will be contacted by phone or by letter within the next 1-3 weeks.  Please call us at [phone] if you have not heard about the biopsies in 3 weeks.    SIGNATURES/CONFIDENTIALITY: You and/or your care partner have signed paperwork which will be entered into your electronic medical record.  These signatures attest to the fact that that the information above on your After Visit Summary has been reviewed and is understood.  Full responsibility of the confidentiality of this discharge information lies with you and/or your care-partner.

## 2017-03-25 NOTE — Op Note (Signed)
[place] Patient Name: Sherry Huff Procedure Date: 03/25/2017 9:30 AM MRN: [id number] Endoscopist: Mallie Mussel L. Loletha Carrow , MD Age: 51 Referring MD:  Date of Birth: [birth year] Gender: Female Account #: [id number] Procedure:                Colonoscopy Indications:              Screening for colorectal malignant neoplasm, This                            is the patient's first colonoscopy Medicines:                Monitored Anesthesia Care Procedure:                Pre-Anesthesia Assessment:                           - Prior to the procedure, a History and Physical                            was performed, and patient medications and                            allergies were reviewed. The patient's tolerance of                            previous anesthesia was also reviewed. The risks                            and benefits of the procedure and the sedation                            options and risks were discussed with the patient.                            All questions were answered, and informed consent                            was obtained. Anticoagulants: The patient has taken                            aspirin. It was decided not to withhold this                            medication prior to the procedure. ASA Grade                            Assessment: II - A patient with mild systemic                            disease. After reviewing the risks and benefits,                            the patient was deemed in satisfactory condition to  undergo the procedure.                           After obtaining informed consent, the colonoscope                            was passed under direct vision. Throughout the                            procedure, the patient's blood pressure, pulse, and                            oxygen saturations were monitored continuously. The                            Colonoscope was introduced through the anus and                             advanced to the the cecum, identified by                            appendiceal orifice and ileocecal valve. The                            colonoscopy was performed without difficulty. The                            patient tolerated the procedure well. The quality                            of the bowel preparation was good. The ileocecal                            valve, appendiceal orifice, and rectum were                            photographed. The quality of the bowel preparation                            was evaluated using the BBPS [facility] Bowel                            Preparation Scale) with scores of: Right Colon = 2,                            Transverse Colon = 2 and Left Colon = 2. The total                            BBPS score equals 6. The bowel preparation used was                            SUPREP. Scope In: 9:47:35 AM Scope Out: 10:05:41 AM Scope Withdrawal Time: 0 hours 14 minutes 54 seconds  Total Procedure Duration:  0 hours 18 minutes 6 seconds  Findings:                 The perianal and digital rectal examinations were                            normal.                           A 4 mm polyp was found in the hepatic flexure. The                            polyp was sessile. The polyp was removed with a                            cold snare. Resection and retrieval were complete.                           A 6 mm polyp was found in the rectum. The polyp was                            semi-sessile. The polyp was removed with a hot                            snare. Resection and retrieval were complete.                           The exam was otherwise without abnormality on                            direct and retroflexion views. Complications:            No immediate complications. Estimated Blood Loss:     Estimated blood loss was minimal. Impression:               - One 4 mm polyp at the hepatic flexure, removed                             with a cold snare. Resected and retrieved.                           - One 6 mm polyp in the rectum, removed with a hot                            snare. Resected and retrieved.                           - The examination was otherwise normal on direct                            and retroflexion views. Recommendation:           - Patient has a contact number available for  emergencies. The signs and symptoms of potential                            delayed complications were discussed with the                            patient. Return to normal activities tomorrow.                            Written discharge instructions were provided to the                            patient.                           - Resume previous diet.                           - Continue present medications.                           - No aspirin, ibuprofen, naproxen, or other                            non-steroidal anti-inflammatory drugs for 5 days                            after polyp removal.                           - Await pathology results.                           - Repeat colonoscopy is recommended for                            surveillance. The colonoscopy date will be                            determined after pathology results from today's                            exam become available for review. Henry L. Loletha Carrow, MD 03/25/2017 10:14:24 AM This report has been signed electronically.

## 2017-03-26 ENCOUNTER — Telehealth: Payer: Self-pay

## 2017-03-26 NOTE — Telephone Encounter (Signed)
  Follow up Call-  Call back number 03/25/2017  Post procedure Call Back phone  # [phone]  Permission to leave phone message Yes  Some recent data might be hidden     Patient questions:  Do you have a fever, pain , or abdominal swelling? No. Pain Score  0 *  Have you tolerated food without any problems? Yes.    Have you been able to return to your normal activities? Yes.    Do you have any questions about your discharge instructions: Diet   No. Medications  No. Follow up visit  No.  Do you have questions or concerns about your Care? No.  Actions: * If pain score is 4 or above: No action needed, pain <4.

## 2017-03-27 ENCOUNTER — Encounter: Payer: Self-pay | Admitting: Gastroenterology

## 2017-03-28 ENCOUNTER — Encounter: Payer: Self-pay | Admitting: Family Medicine

## 2017-09-04 ENCOUNTER — Other Ambulatory Visit: Payer: Self-pay | Admitting: Family Medicine

## 2017-11-11 NOTE — Progress Notes (Signed)
Chief Complaint  Patient presents with  . Annual Exam    fasting annual exam with pelvic. Sees [facility] and will do eye exam there. No concerns.   . Flu Vaccine    did not get flu shot this past season.     Sherry Huff is a 52 y.o. female who presents for a complete physical.  She has the following concerns:  Vaginal itch for about a month, not improved with a couple of monistat courses OTC. Denies discharge, no new products.  Patient presents to follow up on hypertension. Blood pressure is not checked elsewhere. She currently denies headaches, dizziness, edema, muscle cramps. She had low K+ of 3.3 last year, and was advised to increase potassium intake in her diet. She wasn't good about eating bananas so is taking a potassium OTC supplement. Denies muscle cramps/spasms. Compliant with taking medications daily, taking them all in the evening. Up once or twice to void at night, tolerable, can get back to sleep.  Vitamin D deficiency:  Level was low at 17 last year  She was treated with rx 50,000 IU x 12 weeks, and advised to start taking her MVI daily, vs getting a separate Vitamin D 1000-2000 IU dose and take that every day. She is taing a MVI daily that contains D, not taking a separate D(levels were normal in the past, when taking vitamin more regularly).    Hyperlipidemia follow-up: Patient is reportedly following a low-fat, low cholesterol diet.  Denies side effects from the statin. Also takes omega-3 fish oil once daily.  Her cholesterol was worse in 10/2016 from the time prior--the LDL was good, but the HDL was lower, and the triglycerides were very high. We changed her last year from lipitor to Crestor 40mg . She was supposed to return in 3 months for f/u labs, but never did (and her Crestor was inadvertantly refilled by nurse, not recognizing she was past due for labs). She was also counseled re: cutting back the sugar in her diet (sodas), which affects the TG.  She cut out all soda 4 months ago.  She has an occasional sweet tea (3x/week). Occ juice at home in the evenings (fruit punch). Last lipids: Lab Results  Component Value Date   CHOL 173 11/06/2016   HDL 34 (L) 11/06/2016   LDLCALC 84 11/06/2016   TRIG 274 (H) 11/06/2016   CHOLHDL 5.1 (H) 11/06/2016   H/o Tobacco Abuse: She quit smoking 06/2014 using Chantix. She only used it for 4-6 weeks.She gained 20# after she quit smoking, has had trouble losing it. She has had success over the last few months in losing the weight, by giving up her sodas.  Immunization History  Administered Date(s) Administered  . Influenza Split 06/04/2005, 06/19/2006, 04/25/2008  . Influenza,inj,Quad PF,6+ Mos 08/01/2014, 05/31/2015  . Pneumococcal Polysaccharide-23 10/19/2012  . Td 03/06/1995, 06/04/2005  . Tdap 10/19/2012   Flu shot--didn't get one this past year Last Pap smear: 11/2009; no abnormal paps; had hysterectomy for benign reasons Last mammogram: 06/2015, scheduled for next month. (missed last year's) Last colonoscopy: 03/2017, had tubular adenoma, needs f/u in 5 years (03/2022) Last DEXA: never  Dentist: regularly, every 6 months. Ophtho: borderline pressures noted in past, stable. Goes yearly, UTD. Exercise: Hasn't been walking or getting regular exercise. No weight-bearing exercise.  Past Medical History:  Diagnosis Date  . Allergy   . Colon polyp 03/2017   tubular adenoma; repeat colonoscopy in 03/2022  . Depression    in the  past was on Prozac not presently   . GERD (gastroesophageal reflux disease)    not on meds  . Hyperlipidemia   . Hypertension   . Tobacco use disorder    quit 07/19/2014    Past Surgical History:  Procedure Laterality Date  . ABDOMINAL HYSTERECTOMY  2010   partial (with bladder sling); benign  . BUNIONECTOMY     left  . CESAREAN SECTION     x2  . GANGLION CYST EXCISION     left arm  . INCONTINENCE SURGERY  2010   Dr. Philis Pique    Social History    Socioeconomic History  . Marital status: Married    Spouse name: Not on file  . Number of children: 2  . Years of education: Not on file  . Highest education level: Not on file  Occupational History  . Occupation: Press photographer: [employer]  . Financial resource strain: Not on file  . Food insecurity:    Worry: Not on file    Inability: Not on file  . Transportation needs:    Medical: Not on file    Non-medical: Not on file  Tobacco Use  . Smoking status: Former Smoker    Packs/day: 0.50    Years: 10.00    Pack years: 5.00    Types: Cigarettes    Last attempt to quit: 07/19/2014    Years since quitting: 3.3  . Smokeless tobacco: Never Used  Substance and Sexual Activity  . Alcohol use: Yes    Alcohol/week: 0.0 oz    Comment: 1 drink a week  . Drug use: No  . Sexual activity: Yes    Partners: Male    Birth control/protection: Surgical  Lifestyle  . Physical activity:    Days per week: Not on file    Minutes per session: Not on file  . Stress: Not on file  Relationships  . Social connections:    Talks on phone: Not on file    Gets together: Not on file    Attends religious service: Not on file    Active member of club or organization: Not on file    Attends meetings of clubs or organizations: Not on file    Relationship status: Not on file  . Intimate partner violence:    Fear of current or ex partner: Not on file    Emotionally abused: Not on file    Physically abused: Not on file    Forced sexual activity: Not on file  Other Topics Concern  . Not on file  Social History Narrative   Married, 2 sons (1 in [place], and a 41 yo).  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 3 dogs.   Quit smoking 06/2014    Family History  Problem Relation Age of Onset  . Cancer Mother        died of leukemia  . Cancer Father        died of lung cancer  . Hypertension Father   . Depression Sister        bipolar  . Dementia  Sister        related to self-inflected gunshot wound to head  . Hypertension Brother   . Hyperlipidemia Brother   . Heart disease Brother        CABG at 22  . Stroke Paternal Aunt   . Heart disease Paternal Uncle   . COPD Paternal Uncle   .  Diabetes Maternal Aunt   . Cancer Paternal Grandmother        breast cancer  . Breast cancer Paternal Grandmother        in her 62's  . Colon polyps Neg Hx   . Colon cancer Neg Hx   . Esophageal cancer Neg Hx   . Rectal cancer Neg Hx   . Stomach cancer Neg Hx     Outpatient Encounter Medications as of 11/13/2017  Medication Sig Note  . aspirin 81 MG tablet Take 81 mg by mouth daily.     . benazepril (LOTENSIN) 20 MG tablet TAKE 1 TABLET (20 MG TOTAL) BY MOUTH DAILY.   . hydrochlorothiazide (MICROZIDE) 12.5 MG capsule TAKE 1 CAPSULE (12.5 MG TOTAL) BY MOUTH DAILY.   . Multiple Vitamin (MULTIVITAMIN) capsule Take 1 capsule by mouth daily.     . Omega-3 Fatty Acids (FISH OIL) 1000 MG CAPS Take 1 capsule by mouth daily.   Marland Kitchen POTASSIUM PO Take 1 tablet by mouth daily. 11/13/2017: OTC supplement  . rosuvastatin (CRESTOR) 40 MG tablet TAKE 1 TABLET (40 MG TOTAL) BY MOUTH DAILY.    Facility-Administered Encounter Medications as of 11/13/2017  Medication  . 0.9 %  sodium chloride infusion    Allergies  Allergen Reactions  . Codeine Nausea And Vomiting  . Penicillins Nausea And Vomiting  . Strawberry (Diagnostic) Rash    ROS: The patient denies anorexia, fever, vision changes, decreased hearing, ear pain, sore throat, breast concerns, chest pain, palpitations, dizziness, syncope, dyspnea on exertion, cough, swelling, nausea, vomiting, diarrhea, constipation, abdominal pain, melena, hematochezia, indigestion/heartburn, hematuria, incontinence, dysuria, vaginal bleeding, discharge, odor, genital lesions, joint pains, numbness, tingling, weakness, tremor, suspicious skin lesions, depression, anxiety, abnormal bleeding/bruising, or enlarged lymph nodes.   mild hot flashes, slight night sweats (not drenching) Back pain resolved (after taking vitamin D), only slight discomfort with prolonged standing.  +allergies with mild eye symptoms, runny nose, sneezing, cough.  Mucus is clear. Some slight stress incontinence with cough/sneeze (much improved since bladder sling), worse with recent allergies. Slight vaginal itch, OTC monistat didn't help.  No discharge.    PHYSICAL EXAM:  BP 110/76   Pulse 80   Ht 5\' 3"  (1.6 m)   Wt 179 lb (81.2 kg)   BMI 31.71 kg/m   Wt Readings from Last 3 Encounters:  11/13/17 179 lb (81.2 kg)  03/25/17 192 lb (87.1 kg)  03/11/17 195 lb 6.4 oz (88.6 kg)    General Appearance:  Alert, cooperative, no distress, appears stated age   Head:  Normocephalic, without obvious abnormality, atraumatic   Eyes:  PERRL, conjunctiva/corneas clear, EOM's intact, fundi benign   Ears:  Normal TM's and external ear canals   Nose:  Nares normal, mucosa mildly edematous, no erythema or purulence. Sinuses nontender.   Throat:  Lips, mucosa, and tongue normal; teeth and gums normal   Neck:  Supple, no lymphadenopathy; thyroid: no enlargement/ tenderness/nodules; no carotid bruit or JVD.   Back:  Spine nontender, no curvature, ROM normal, no CVA tenderness.   Lungs:  Clear to auscultation bilaterally without rales or ronchi;  respirations unlabored   Chest Wall:  No tenderness or deformity   Heart:  Regular rate and rhythm, S1 and S2 normal, no murmur, rub or gallop   Breast Exam:  No tenderness, masses, or nipple discharge or inversion. No axillary lymphadenopathy. L breast is a little larger (and hangs lower when seated), with slightly more pink/violaceous discoloration medial aspect of left breast when laying  flat.  No difference in coloration noted when seated (both have some prominent superficial blood vessels.)  Abdomen:  Soft, non-tender, nondistended, normoactive bowel sounds, no masses, no  hepatosplenomegaly   Genitalia:  External genitalia: Mild erythema/inflammation of labia majora. Hypopigmented area on right labia majora; no other lesions, no discharge. BUS and vagina normal.  Uterus absent, adnexa not enlarged, nontender, no masses. Pap not performed.  Rectal:  Normal sphincter tone, no masses; heme negative stool   Extremities:  No clubbing, cyanosis or edema   Pulses:  2+ and symmetric all extremities   Skin:  Skin color, texture, turgor normal, no rashes or lesions.  Lymph nodes:  Cervical, supraclavicular, and axillary nodes normal   Neurologic:  CNII-XII intact, normal strength, sensation and gait; reflexes 2+ and symmetric throughout    Psych: Normal mood, affect, hygiene and grooming   ASSESSMENT/PLAN:  Annual physical exam - Plan: Comprehensive metabolic panel, Lipid panel, CBC with Differential/Platelet, VITAMIN D 25 Hydroxy (Vit-D Deficiency, Fractures), TSH  Mixed hyperlipidemia - Plan: Lipid panel  Vitamin D deficiency - Plan: VITAMIN D 25 Hydroxy (Vit-D Deficiency, Fractures)  Essential hypertension, benign - Plan: Comprehensive metabolic panel, hydrochlorothiazide (MICROZIDE) 12.5 MG capsule, benazepril (LOTENSIN) 20 MG tablet  Hypokalemia - Plan: Comprehensive metabolic panel  Essential hypertension, benign - controlled - Plan: Comprehensive metabolic panel, hydrochlorothiazide (MICROZIDE) 12.5 MG capsule, benazepril (LOTENSIN) 20 MG tablet  Obesity (BMI 30.0-34.9) - congratulated on wt loss, giving up soda. Encouraged to continue to work on diet, exercise, weight loss   Discussed monthly self breast exams and yearly mammograms (past due); at least 30 minutes of aerobic activity at least 5 days/week, weight-bearing exercise at least 2x/wk; proper sunscreen use reviewed; healthy diet, including goals of calcium and vitamin D intake and alcohol recommendations (less than or equal to 1 drink/day) reviewed; regular seatbelt  use; changing batteries in smoke detectors. Immunization recommendations discussed--yearly flu shots recommended. Shingrix recommended, to check with her insurance. Colonoscopy recommendations reviewed--UTD, repeat 03/2022.  F/u 1 year, sooner prn

## 2017-11-13 ENCOUNTER — Ambulatory Visit (INDEPENDENT_AMBULATORY_CARE_PROVIDER_SITE_OTHER): Payer: BLUE CROSS/BLUE SHIELD | Admitting: Family Medicine

## 2017-11-13 ENCOUNTER — Encounter: Payer: Self-pay | Admitting: Family Medicine

## 2017-11-13 VITALS — BP 110/76 | HR 80 | Ht 63.0 in | Wt 179.0 lb

## 2017-11-13 DIAGNOSIS — I10 Essential (primary) hypertension: Secondary | ICD-10-CM

## 2017-11-13 DIAGNOSIS — E559 Vitamin D deficiency, unspecified: Secondary | ICD-10-CM | POA: Diagnosis not present

## 2017-11-13 DIAGNOSIS — Z0000 Encounter for general adult medical examination without abnormal findings: Secondary | ICD-10-CM

## 2017-11-13 DIAGNOSIS — E782 Mixed hyperlipidemia: Secondary | ICD-10-CM

## 2017-11-13 DIAGNOSIS — E669 Obesity, unspecified: Secondary | ICD-10-CM

## 2017-11-13 DIAGNOSIS — E876 Hypokalemia: Secondary | ICD-10-CM

## 2017-11-13 MED ORDER — HYDROCHLOROTHIAZIDE 12.5 MG PO CAPS: ORAL_CAPSULE | ORAL | 3 refills | Status: DC

## 2017-11-13 MED ORDER — BENAZEPRIL HCL 20 MG PO TABS: ORAL_TABLET | ORAL | 3 refills | Status: DC

## 2017-11-13 NOTE — Patient Instructions (Signed)
  HEALTH MAINTENANCE RECOMMENDATIONS:  It is recommended that you get at least 30 minutes of aerobic exercise at least 5 days/week (for weight loss, you may need as much as 60-90 minutes). This can be any activity that gets your heart rate up. This can be divided in 10-15 minute intervals if needed, but try and build up your endurance at least once a week.  Weight bearing exercise is also recommended twice weekly.  Eat a healthy diet with lots of vegetables, fruits and fiber.  "Colorful" foods have a lot of vitamins (ie green vegetables, tomatoes, red peppers, etc).  Limit sweet tea, regular sodas and alcoholic beverages, all of which has a lot of calories and sugar.  Up to 1 alcoholic drink daily may be beneficial for women (unless trying to lose weight, watch sugars).  Drink a lot of water.  Calcium recommendations are 1200-1500 mg daily (1500 mg for postmenopausal women or women without ovaries), and vitamin D 1000 IU daily.  This should be obtained from diet and/or supplements (vitamins), and calcium should not be taken all at once, but in divided doses.  Monthly self breast exams and yearly mammograms for women over the age of 67 is recommended.  Sunscreen of at least SPF 30 should be used on all sun-exposed parts of the skin when outside between the hours of 10 am and 4 pm (not just when at beach or pool, but even with exercise, golf, tennis, and yard work!)  Use a sunscreen that says "broad spectrum" so it covers both UVA and UVB rays, and make sure to reapply every 1-2 hours.  Remember to change the batteries in your smoke detectors when changing your clock times in the spring and fall.  Use your seat belt every time you are in a car, and please drive safely and not be distracted with cell phones and texting while driving.  I recommend getting the new shingles vaccine (Shingrix). You will need to check with your insurance to see if it is covered, and if covered by Medicare Part D, you need to  get from the pharmacy rather than our office.  It is a series of 2 injections, spaced 2 months apart. Call here to get your name put on the waiting list so that we call you when it is available.

## 2017-11-14 LAB — COMPREHENSIVE METABOLIC PANEL
ALT: 27 IU/L (ref 0–32)
AST: 24 IU/L (ref 0–40)
Albumin/Globulin Ratio: 2.3 — ABNORMAL HIGH (ref 1.2–2.2)
Albumin: 5.3 g/dL (ref 3.5–5.5)
Alkaline Phosphatase: 70 IU/L (ref 39–117)
BUN/Creatinine Ratio: 16 (ref 9–23)
BUN: 13 mg/dL (ref 6–24)
Bilirubin Total: 0.4 mg/dL (ref 0.0–1.2)
CO2: 28 mmol/L (ref 20–29)
Calcium: 10.4 mg/dL — ABNORMAL HIGH (ref 8.7–10.2)
Chloride: 96 mmol/L (ref 96–106)
Creatinine, Ser: 0.8 mg/dL (ref 0.57–1.00)
GFR calc Af Amer: 99 mL/min/{1.73_m2} (ref >59)
GFR calc non Af Amer: 86 mL/min/{1.73_m2} (ref >59)
Globulin, Total: 2.3 g/dL (ref 1.5–4.5)
Glucose: 100 mg/dL — ABNORMAL HIGH (ref 65–99)
Potassium: 3.9 mmol/L (ref 3.5–5.2)
Sodium: 141 mmol/L (ref 134–144)
Total Protein: 7.6 g/dL (ref 6.0–8.5)

## 2017-11-14 LAB — CBC WITH DIFFERENTIAL/PLATELET
Basophils Absolute: 0 10*3/uL (ref 0.0–0.2)
Basos: 0 %
EOS (ABSOLUTE): 0.2 10*3/uL (ref 0.0–0.4)
Eos: 3 %
Hematocrit: 45.5 % (ref 34.0–46.6)
Hemoglobin: 15.5 g/dL (ref 11.1–15.9)
Immature Grans (Abs): 0 10*3/uL (ref 0.0–0.1)
Immature Granulocytes: 0 %
Lymphocytes Absolute: 3.3 10*3/uL — ABNORMAL HIGH (ref 0.7–3.1)
Lymphs: 40 %
MCH: 29.5 pg (ref 26.6–33.0)
MCHC: 34.1 g/dL (ref 31.5–35.7)
MCV: 87 fL (ref 79–97)
Monocytes Absolute: 0.5 10*3/uL (ref 0.1–0.9)
Monocytes: 6 %
Neutrophils Absolute: 4.2 10*3/uL (ref 1.4–7.0)
Neutrophils: 51 %
Platelets: 310 10*3/uL (ref 150–379)
RBC: 5.26 x10E6/uL (ref 3.77–5.28)
RDW: 13.8 % (ref 12.3–15.4)
WBC: 8.2 10*3/uL (ref 3.4–10.8)

## 2017-11-14 LAB — VITAMIN D 25 HYDROXY (VIT D DEFICIENCY, FRACTURES): Vit D, 25-Hydroxy: 45 ng/mL (ref 30.0–100.0)

## 2017-11-14 LAB — LIPID PANEL
Chol/HDL Ratio: 3.2 ratio (ref 0.0–4.4)
Cholesterol, Total: 139 mg/dL (ref 100–199)
HDL: 43 mg/dL (ref >39)
LDL Calculated: 62 mg/dL (ref 0–99)
Triglycerides: 170 mg/dL — ABNORMAL HIGH (ref 0–149)
VLDL Cholesterol Cal: 34 mg/dL (ref 5–40)

## 2017-11-14 LAB — TSH: TSH: 0.367 u[IU]/mL — ABNORMAL LOW (ref 0.450–4.500)

## 2017-11-18 LAB — SPECIMEN STATUS REPORT

## 2017-11-18 LAB — T4: T4, Total: 10.1 ug/dL (ref 4.5–12.0)

## 2017-12-02 ENCOUNTER — Ambulatory Visit
Admission: RE | Admit: 2017-12-02 | Discharge: 2017-12-02 | Disposition: A | Discharge status: Home / Self Care | Payer: BLUE CROSS/BLUE SHIELD | Source: Ambulatory Visit | Attending: Family Medicine | Admitting: Family Medicine

## 2017-12-02 DIAGNOSIS — Z1231 Encounter for screening mammogram for malignant neoplasm of breast: Secondary | ICD-10-CM

## 2017-12-04 ENCOUNTER — Other Ambulatory Visit: Payer: Self-pay | Admitting: Family Medicine

## 2018-11-12 ENCOUNTER — Other Ambulatory Visit: Payer: Self-pay | Admitting: *Deleted

## 2018-11-12 DIAGNOSIS — R7989 Other specified abnormal findings of blood chemistry: Secondary | ICD-10-CM

## 2018-11-12 DIAGNOSIS — E7800 Pure hypercholesterolemia, unspecified: Secondary | ICD-10-CM

## 2018-11-12 DIAGNOSIS — I10 Essential (primary) hypertension: Secondary | ICD-10-CM

## 2018-11-12 DIAGNOSIS — Z5181 Encounter for therapeutic drug level monitoring: Secondary | ICD-10-CM

## 2018-11-16 ENCOUNTER — Other Ambulatory Visit: Payer: Self-pay

## 2018-11-16 ENCOUNTER — Other Ambulatory Visit: Payer: BLUE CROSS/BLUE SHIELD

## 2018-11-16 DIAGNOSIS — Z5181 Encounter for therapeutic drug level monitoring: Secondary | ICD-10-CM

## 2018-11-16 DIAGNOSIS — R7989 Other specified abnormal findings of blood chemistry: Secondary | ICD-10-CM

## 2018-11-16 DIAGNOSIS — I10 Essential (primary) hypertension: Secondary | ICD-10-CM

## 2018-11-16 DIAGNOSIS — E7800 Pure hypercholesterolemia, unspecified: Secondary | ICD-10-CM

## 2018-11-17 LAB — LIPID PANEL
Chol/HDL Ratio: 2.8 ratio (ref 0.0–4.4)
Cholesterol, Total: 111 mg/dL (ref 100–199)
HDL: 39 mg/dL — ABNORMAL LOW (ref >39)
LDL Calculated: 47 mg/dL (ref 0–99)
Triglycerides: 123 mg/dL (ref 0–149)
VLDL Cholesterol Cal: 25 mg/dL (ref 5–40)

## 2018-11-17 LAB — COMPREHENSIVE METABOLIC PANEL
ALT: 38 IU/L — ABNORMAL HIGH (ref 0–32)
AST: 28 IU/L (ref 0–40)
Albumin/Globulin Ratio: 2.2 (ref 1.2–2.2)
Albumin: 4.4 g/dL (ref 3.8–4.9)
Alkaline Phosphatase: 79 IU/L (ref 39–117)
BUN/Creatinine Ratio: 24 — ABNORMAL HIGH (ref 9–23)
BUN: 14 mg/dL (ref 6–24)
Bilirubin Total: 0.5 mg/dL (ref 0.0–1.2)
CO2: 24 mmol/L (ref 20–29)
Calcium: 10.5 mg/dL — ABNORMAL HIGH (ref 8.7–10.2)
Chloride: 104 mmol/L (ref 96–106)
Creatinine, Ser: 0.59 mg/dL (ref 0.57–1.00)
GFR calc Af Amer: 122 mL/min/{1.73_m2} (ref >59)
GFR calc non Af Amer: 106 mL/min/{1.73_m2} (ref >59)
Globulin, Total: 2 g/dL (ref 1.5–4.5)
Glucose: 99 mg/dL (ref 65–99)
Potassium: 3.8 mmol/L (ref 3.5–5.2)
Sodium: 142 mmol/L (ref 134–144)
Total Protein: 6.4 g/dL (ref 6.0–8.5)

## 2018-11-17 LAB — TSH: TSH: <0.006 u[IU]/mL — ABNORMAL LOW (ref 0.450–4.500)

## 2018-11-17 LAB — T4, FREE: Free T4: 4.1 ng/dL — ABNORMAL HIGH (ref 0.82–1.77)

## 2018-11-17 NOTE — Patient Instructions (Addendum)
Continue your daily multivitamin, rosuvastatin and HCTZ. We are stopping your benazepril and instead starting metoprolol 50mg .  This is to help keep your pulse from getting too fast related to over-active thyroid that was found.  Please contact us within the next few weeks with your blood pressure and pulse (unless you are seeing the endocrinologist by then, who will assess this) so that we know how this change in medication is working for you.   We are referring you to an endocrinologist to treat your overactive thyroid (hyperthyroidism). Please contact our office if you do not get a call from them [facility]).     Hyperthyroidism  Hyperthyroidism is when the thyroid gland is too active (overactive). The thyroid gland is a small gland located in the lower front part of the neck, just in front of the windpipe (trachea). This gland makes hormones that help control how the body uses food for energy (metabolism) as well as how the heart and brain function. These hormones also play a role in keeping your bones strong. When the thyroid is overactive, it produces too much of a hormone called thyroxine. What are the causes? This condition may be caused by:  Graves' disease. This is a disorder in which the body's disease-fighting system (immune system) attacks the thyroid gland. This is the most common cause.  Inflammation of the thyroid gland.  A tumor in the thyroid gland.  Use of certain medicines, including: ? Prescription thyroid hormone replacement. ? Herbal supplements that mimic thyroid hormones. ? Amiodarone therapy.  Solid or fluid-filled lumps within your thyroid gland (thyroid nodules).  Taking in a large amount of iodine from foods or medicines. What increases the risk? You are more likely to develop this condition if:  You are female.  You have a family history of thyroid conditions.  You smoke tobacco.  You use a medicine called lithium.  You take  medicines that affect the immune system (immunosuppressants). What are the signs or symptoms? Symptoms of this condition include:  Nervousness.  Inability to tolerate heat.  Unexplained weight loss.  Diarrhea.  Change in the texture of hair or skin.  Heart skipping beats or making extra beats.  Rapid heart rate.  Loss of menstruation.  Shaky hands.  Fatigue.  Restlessness.  Sleep problems.  Enlarged thyroid gland or a lump in the thyroid (nodule). You may also have symptoms of Graves' disease, which may include:  Protruding eyes.  Dry eyes.  Red or swollen eyes.  Problems with vision. How is this diagnosed? This condition may be diagnosed based on:  Your symptoms and medical history.  A physical exam.  Blood tests.  Thyroid ultrasound. This test involves using sound waves to produce images of the thyroid gland.  A thyroid scan. A radioactive substance is injected into a vein, and images show how much iodine is present in the thyroid.  Radioactive iodine uptake test (RAIU). A small amount of radioactive iodine is given by mouth to see how much iodine the thyroid absorbs after a certain amount of time. How is this treated? Treatment depends on the cause and severity of the condition. Treatment may include:  Medicines to reduce the amount of thyroid hormone your body makes.  Radioactive iodine treatment (radioiodine therapy). This involves swallowing a small dose of radioactive iodine, in capsule or liquid form, to kill thyroid cells.  Surgery to remove part or all of your thyroid gland. You may need to take thyroid hormone replacement medicine for the rest of  your life after thyroid surgery.  Medicines to help manage your symptoms. Follow these instructions at home:   Take over-the-counter and prescription medicines only as told by your health care provider.  Do not use any products that contain nicotine or tobacco, such as cigarettes and e-cigarettes.  If you need help quitting, ask your health care provider.  Follow any instructions from your health care provider about diet. You may be instructed to limit foods that contain iodine.  Keep all follow-up visits as told by your health care provider. This is important. ? You will need to have blood tests regularly so that your health care provider can monitor your condition. Contact a health care provider if:  Your symptoms do not get better with treatment.  You have a fever.  You are taking thyroid hormone replacement medicine and you: ? Have symptoms of depression. ? Feel like you are tired all the time. ? Gain weight. Get help right away if:  You have chest pain.  You have decreased alertness or a change in your awareness.  You have abdominal pain.  You feel dizzy.  You have a rapid heartbeat.  You have an irregular heartbeat.  You have difficulty breathing. Summary  The thyroid gland is a small gland located in the lower front part of the neck, just in front of the windpipe (trachea).  Hyperthyroidism is when the thyroid gland is too active (overactive) and produces too much of a hormone called thyroxine.  The most common cause is Graves' disease, a disorder in which your immune system attacks the thyroid gland.  Hyperthyroidism can cause various symptoms, such as unexplained weight loss, nervousness, inability to tolerate heat, or changes in your heartbeat.  Treatment may include medicine to reduce the amount of thyroid hormone your body makes, radioiodine therapy, surgery, or medicines to manage symptoms. This information is not intended to replace advice given to you by your health care provider. Make sure you discuss any questions you have with your health care provider. Document Released: 07/08/2005 Document Revised: 06/18/2017 Document Reviewed: 06/18/2017 Elsevier Interactive Patient Education  2019 [employer].

## 2018-11-17 NOTE — Progress Notes (Signed)
Start time: 9:50 End time: 10:21   Virtual Visit via Video Note  I connected with Sherry Huff on 11/17/18 at  9:45 AM EDT by a video enabled telemedicine application (D[employer]) and verified that I am speaking with the correct person using two identifiers. Patient is at home, son is upstairs. MD is in office.   I discussed the limitations of evaluation and management by telemedicine and the availability of in person appointments. The patient expressed understanding and agreed to proceed. She is aware and consents to filing insurance for this visit.  History of Present Illness:  Chief Complaint  Patient presents with  . Hypertension    VIRTUAL med check. No concerns.    She is tired, has achey neck and shoulder but otherwise is fine without complaints.  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   Hypertension follow-up:  She reports compliance with her medications (taking them all in the evening).  She reports that she is taking benazepril only every other day, due to BP's being well controlled at her last visit. She is taking OTC potassium supplement.  She denies muscle cramps/spasms. She denies dizziness, chest pain, palpitations or shortness of breath. Occasional sinus headaches due to allergies. Has noticed swelling (sock marks) in her feet over the last year.HCTZ helps, doesn't want to stop that, feels that it keeps it under control.  BP was 136/80 yesterday, not checking other times. She had taken the ARB the night before.  Vitamin D deficiency: Level was normal at 50 last year. The year before, level had been low at 17.  She is currently taking MVI daily (which she had run out of the year that it was low).  Hyperlipidemia follow-up: Patient is reportedly following a low-fat, low cholesterol diet.  Denies side effects from the Crestor. Also takes omega-3 fish oilonce daily.She has history of elevated  TG, but that improved after cutting out soda.  Last year she reported sweet tea 3x/week, occ fruit punch in the evenings, but no regular soda. She has stayed off sodas.  H/o Tobacco Abuse: She quit smoking 12/2015using Chantix. She only used it for 4-6 weeks.She gained 20# after shequit smoking, had trouble losing it, but finally lost weight last year after giving up soda. Gained some weight since isolating/stay-at-home related to the COVID-19 pandemic.  She states she can tell by her clothes, doesn't have scale. Only a little exercise recently, not much.  Hyperthyroidism.  TSH screenings had been normal until last year. Last year TSH was low at 0.367. T4 was normal at 10.1 (ordered in error, instead of free T4).  She had labs done prior to today's visit, showing significantly lower TSH and elevated free T4. See labs below.  Denies weight loss (as stated above, has gained some). Denies palpitations or tachycardia.  Has noticed feeling slightly winded with the stairs. Denies hair/skin changes.  Denies vision changes. Notes some heat intolerance for 3-4 years, no recent changes. Denies changes in bowels.  PMH, PSH, SH reviewed  Outpatient Encounter Medications as of 11/18/2018  Medication Sig Note  . aspirin 81 MG tablet Take 81 mg by mouth daily.     . hydrochlorothiazide (MICROZIDE) 12.5 MG capsule TAKE 1 CAPSULE (12.5 MG TOTAL) BY MOUTH DAILY.   . Multiple Vitamin (MULTIVITAMIN) capsule Take 1 capsule by mouth daily.     . Omega-3 Fatty Acids (FISH OIL) 1000 MG CAPS Take 2 capsules by  mouth daily.  11/18/2018: Has been out about a week  . POTASSIUM PO Take 1 tablet by mouth daily. 11/13/2017: OTC supplement  . rosuvastatin (CRESTOR) 40 MG tablet Take 1 tablet (40 mg total) by mouth daily.   . [DISCONTINUED] benazepril (LOTENSIN) 20 MG tablet TAKE 1 TABLET (20 MG TOTAL) BY MOUTH DAILY. 11/18/2018: change to beta blocker due to hyperthyroidism  . [DISCONTINUED] hydrochlorothiazide  (MICROZIDE) 12.5 MG capsule TAKE 1 CAPSULE (12.5 MG TOTAL) BY MOUTH DAILY.   . [DISCONTINUED] rosuvastatin (CRESTOR) 40 MG tablet TAKE 1 TABLET (40 MG TOTAL) BY MOUTH DAILY.   . metoprolol succinate (TOPROL-XL) 50 MG 24 hr tablet Take 1 tablet (50 mg total) by mouth daily. Take with or immediately following a meal.    Facility-Administered Encounter Medications as of 11/18/2018  Medication  . 0.9 %  sodium chloride infusion   Was taking benazepril 20mg  every other day prior to today's visit, NOT metoprolol.  Allergies  Allergen Reactions  . Codeine Nausea And Vomiting  . Penicillins Nausea And Vomiting  . Strawberry (Diagnostic) Rash   ROS: no fever, chills, URI symptoms.  Some allergies and sinus headaches. No dizziness, chest pain, palpitations, GI or GU complaints.  No hair/skin/bowel changes.  Some weight gain.  See HPI.   Observations/Objective:  BP 136/80   Pulse 93   Temp 98.4 F (36.9 C) (Tympanic)   Resp 16   Ht 5\' 4"  (1.626 m)   SpO2 95%   BMI 30.73 kg/m   Wt Readings from Last 3 Encounters:  11/13/17 179 lb (81.2 kg)  03/25/17 192 lb (87.1 kg)  03/11/17 195 lb 6.4 oz (88.6 kg)   Well appearing female, in good spirits in no distress. She is alert and oriented.   There is no visible proptosis noted. Neck was examined by her extending her head backwards and swallowing--no visible mass or nodule could be appreciated over the video. Skin appears normal. Cranial nerves are grossly intact. Normal eye contact, speech, grooming.  Lab Results  Component Value Date   TSH <0.006 (L) 11/16/2018   T4TOTAL 10.1 11/13/2017   Lab Results  Component Value Date   CHOL 111 11/16/2018   HDL 39 (L) 11/16/2018   LDLCALC 47 11/16/2018   TRIG 123 11/16/2018   CHOLHDL 2.8 11/16/2018     Chemistry      Component Value Date/Time   NA 142 11/16/2018 0920   K 3.8 11/16/2018 0920   CL 104 11/16/2018 0920   CO2 24 11/16/2018 0920   BUN 14 11/16/2018 0920   CREATININE 0.59  11/16/2018 0920   CREATININE 0.88 11/06/2016 1002      Component Value Date/Time   CALCIUM 10.5 (H) 11/16/2018 0920   ALKPHOS 79 11/16/2018 0920   AST 28 11/16/2018 0920   ALT 38 (H) 11/16/2018 0920   BILITOT 0.5 11/16/2018 0920     Fasting glucose 99  Assessment and Plan:  Mixed hyperlipidemia - continue current regimen.  Values may be affected by thyroid - Plan: rosuvastatin (CRESTOR) 40 MG tablet, metoprolol succinate (TOPROL-XL) 50 MG 24 hr tablet  Essential hypertension, benign - change from benazepril to toprol XL 50mg  due to hyperthyroidism. Continue HCTZ 12.5mg . Contact us within 2 weeks with BP/pulse if not seeing endo by then - Plan: hydrochlorothiazide (MICROZIDE) 12.5 MG capsule  Hyperthyroidism - Ddx reviewed for causes of overactive thyroid. Referring to endocrinology for further testing and eval - Plan: Ambulatory referral to Endocrinology  Vitamin D deficiency - continue daily MVI  F/u at CPE in November as scheduled.   Follow Up Instructions:  PRINT AND MAIL AVS   I discussed the assessment and treatment plan with the patient. The patient was provided an opportunity to ask questions and all were answered. The patient agreed with the plan and demonstrated an understanding of the instructions.  The patient was advised to call back or seek an in-person evaluation if the symptoms worsen or if the condition fails to improve as anticipated.  I provided 31 minutes of non-face-to-face time during this encounter.   Vikki Ports, MD

## 2018-11-18 ENCOUNTER — Encounter: Payer: Self-pay | Admitting: Family Medicine

## 2018-11-18 ENCOUNTER — Other Ambulatory Visit: Payer: Self-pay

## 2018-11-18 ENCOUNTER — Ambulatory Visit (INDEPENDENT_AMBULATORY_CARE_PROVIDER_SITE_OTHER): Payer: BLUE CROSS/BLUE SHIELD | Admitting: Family Medicine

## 2018-11-18 VITALS — BP 136/80 | HR 93 | Temp 98.4°F | Resp 16 | Ht 64.0 in

## 2018-11-18 DIAGNOSIS — I10 Essential (primary) hypertension: Secondary | ICD-10-CM | POA: Diagnosis not present

## 2018-11-18 DIAGNOSIS — E559 Vitamin D deficiency, unspecified: Secondary | ICD-10-CM

## 2018-11-18 DIAGNOSIS — E0590 Thyrotoxicosis, unspecified without thyrotoxic crisis or storm: Secondary | ICD-10-CM

## 2018-11-18 DIAGNOSIS — E782 Mixed hyperlipidemia: Secondary | ICD-10-CM

## 2018-11-18 MED ORDER — ROSUVASTATIN CALCIUM 40 MG PO TABS: 40.0000 mg | ORAL_TABLET | Freq: Every day | ORAL | 3 refills | Status: DC

## 2018-11-18 MED ORDER — HYDROCHLOROTHIAZIDE 12.5 MG PO CAPS: ORAL_CAPSULE | ORAL | 3 refills | Status: DC

## 2018-11-18 MED ORDER — METOPROLOL SUCCINATE ER 50 MG PO TB24: 50.0000 mg | ORAL_TABLET | Freq: Every day | ORAL | 0 refills | Status: DC

## 2018-12-01 ENCOUNTER — Other Ambulatory Visit: Payer: Self-pay

## 2018-12-01 ENCOUNTER — Ambulatory Visit (INDEPENDENT_AMBULATORY_CARE_PROVIDER_SITE_OTHER): Payer: BLUE CROSS/BLUE SHIELD | Admitting: Internal Medicine

## 2018-12-01 ENCOUNTER — Encounter: Payer: Self-pay | Admitting: Internal Medicine

## 2018-12-01 DIAGNOSIS — E0590 Thyrotoxicosis, unspecified without thyrotoxic crisis or storm: Secondary | ICD-10-CM

## 2018-12-01 MED ORDER — METHIMAZOLE 10 MG PO TABS: 10.0000 mg | ORAL_TABLET | Freq: Every day | ORAL | 3 refills | Status: DC

## 2018-12-01 NOTE — Progress Notes (Signed)
Virtual Visit via Video Note  I connected with Sherry Huff on 12/01/18 at  2:40 PM EDT by a video enabled telemedicine application and verified that I am speaking with the correct person using two identifiers.   I discussed the limitations of evaluation and management by telemedicine and the availability of in person appointments. The patient expressed understanding and agreed to proceed.  -Location of the patient : Home  -Location of the provider : office -The names of all persons participating in the telemedicine service : Pt and myself     Name: Sherry Huff  MRN/ DOB: [id number], 1965/09/28    Age/ Sex: 53 y.o., female    PCP: Rita Ohara, MD   Reason for Endocrinology Evaluation: Hyperthyroidism     Date of Initial Endocrinology Evaluation: 12/01/2018     HPI: Sherry Huff is a 53 y.o. female with a past medical history of HTN . The patient presented for initial endocrinology clinic visit on 12/01/2018 for consultative assistance with her hyperthyroidism .   Pt noted to have hyperthyroidism in April, 2020 with a suppressed TSH < 0.006 uIU/mL and elevated FT4 of 4.10 ng/dL. Pt was having palpitations and sob for ~ 2 months prior to presentations.   In review of her records she has a low TSH in 10/2017 at 0.367 uIU/mL with normal T4.   Today the patient continues with palpitations and sob but denies any weight loss. She did lose weigh tin 2019 that she attributes due to stopping sugar -sweetened beverages.   She has noted anxiety and jittery sensation. But denies local neck symptoms  No Fh of thyroid disease  HISTORY:  Past Medical History:  Past Medical History:  Diagnosis Date   Allergy    Colon polyp 03/2017   tubular adenoma; repeat colonoscopy in 03/2022   Depression    in the past was on Prozac not presently    GERD (gastroesophageal reflux disease)    not on meds   Hyperlipidemia    Hypertension    Tobacco use disorder    quit  07/19/2014    Past Surgical History:  Past Surgical History:  Procedure Laterality Date   ABDOMINAL HYSTERECTOMY  2010   partial (with bladder sling); benign   BUNIONECTOMY     left   CESAREAN SECTION     x2   GANGLION CYST EXCISION     left arm   INCONTINENCE SURGERY  2010   Dr. Philis Pique      Social History:  reports that she quit smoking about 4 years ago. Her smoking use included cigarettes. She has a 5.00 pack-year smoking history. She has never used smokeless tobacco. She reports current alcohol use. She reports that she does not use drugs.  Family History: family history includes Breast cancer in her paternal grandmother; COPD in her paternal uncle; Cancer in her father, mother, and paternal grandmother; Dementia in her sister; Depression in her sister; Diabetes in her maternal aunt; Heart disease in her brother and paternal uncle; Hyperlipidemia in her brother; Hypertension in her brother and father; Stroke in her paternal aunt.   HOME MEDICATIONS: Allergies as of 12/01/2018      Reactions   Codeine Nausea And Vomiting   Penicillins Nausea And Vomiting   Strawberry (diagnostic) Rash      Medication List       Accurate as of Dec 01, 2018  8:55 AM. If you have any questions, ask your nurse or doctor.  aspirin 81 MG tablet Take 81 mg by mouth daily.   Fish Oil 1000 MG Caps Take 2 capsules by mouth daily.   hydrochlorothiazide 12.5 MG capsule Commonly known as:  MICROZIDE TAKE 1 CAPSULE (12.5 MG TOTAL) BY MOUTH DAILY.   metoprolol succinate 50 MG 24 hr tablet Commonly known as:  TOPROL-XL Take 1 tablet (50 mg total) by mouth daily. Take with or immediately following a meal.   multivitamin capsule Take 1 capsule by mouth daily.   POTASSIUM PO Take 1 tablet by mouth daily.   rosuvastatin 40 MG tablet Commonly known as:  CRESTOR Take 1 tablet (40 mg total) by mouth daily.         REVIEW OF SYSTEMS: A comprehensive ROS was conducted with the  patient and is negative except as per HPI and below:  Review of Systems  Constitutional: Negative for fever and weight loss.  HENT: Positive for congestion. Negative for sore throat.   Eyes: Negative for blurred vision and pain.  Respiratory: Positive for shortness of breath. Negative for cough.   Cardiovascular: Positive for palpitations. Negative for chest pain.  Gastrointestinal: Positive for constipation. Negative for diarrhea and nausea.  Genitourinary: Negative for frequency.  Neurological: Positive for tremors. Negative for tingling.  Endo/Heme/Allergies: Positive for polydipsia.  Psychiatric/Behavioral: Negative for depression. The patient is nervous/anxious.         DATA REVIEWED:  Results for GENNETTE, SHADIX (MRN [id number]) as of 12/01/2018 16:11  Ref. Range 11/13/2017 10:49 11/16/2018 09:20  TSH Latest Ref Range: 0.450 - 4.500 uIU/mL 0.367 (L) <0.006 (L)  T4,Free(Direct) Latest Ref Range: 0.82 - 1.77 ng/dL  4.10 (H)  Thyroxine (T4) Latest Ref Range: 4.5 - 12.0 ug/dL 10.1     ASSESSMENT/PLAN/RECOMMENDATIONS:   1. Hyperthyroidism  - Common causes of hyperthyroidism include autonomously functioning thyroid adenomas and multinodular goiters, or Graves' disease. - Clinically she does have hyperthyroid symptoms.  - Will obtain TRAb levels  - We discussed with pt the benefits of methimazole in the Tx of hyperthyroidism, as well as the possible side effects/complications of anti-thyroid drug Tx (specifically detailing the rare, but serious side effect of agranulocytosis). She was informed of need for regular thyroid function monitoring while on methimazole to ensure appropriate dosage without over-treatment. As well, we discussed the possible side effects of methimazole including the chance of rash, the small chance of liver irritation/juandice and the <=1 in 300-400 chance of sudden onset agranulocytosis.  We discussed importance of going to ED promptly (and stopping  methimazole) if shewere to develop significant fever with severe sore throat of other evidence of acute infection.      We extensively discussed the various treatment options for hyperthyroidism and Graves disease including ablation therapy with radioactive iodine versus antithyroid drug treatment versus surgical therapy.  We recommended to the patient that we felt, at this time, that thionamide ablation therapy would be most optimal.  We discussed the various possible benefits versus side effects of the various therapies.   Medications :  Methimazole 10 mg daily   I discussed the assessment and treatment plan with the patient. The patient was provided an opportunity to ask questions and all were answered. The patient agreed with the plan and demonstrated an understanding of the instructions.   The patient was advised to call back or seek an in-person evaluation if the symptoms worsen or if the condition fails to improve as anticipated.    F/u 8 weeks  Labs in 4 weeks  Signed electronically by: Mack Guise, MD  [facility]  [facility] [address] [address] [place], [place] [address] Phone: [phone] FAX: [phone]   CC: Rita Ohara, [place] [address] Phone: [phone] Fax: [phone]   Return to Endocrinology clinic as below: Future Appointments  Date Time Provider [place]  12/01/2018  2:40 PM Ovie Eastep, Melanie Crazier, MD [facility] None  05/26/2019  1:45 PM Rita Ohara, MD [facility] [place]

## 2018-12-11 ENCOUNTER — Other Ambulatory Visit: Payer: Self-pay | Admitting: Family Medicine

## 2018-12-11 DIAGNOSIS — E782 Mixed hyperlipidemia: Secondary | ICD-10-CM

## 2018-12-16 ENCOUNTER — Encounter: Payer: Self-pay | Admitting: Family Medicine

## 2019-01-05 ENCOUNTER — Other Ambulatory Visit: Payer: BLUE CROSS/BLUE SHIELD

## 2019-01-06 ENCOUNTER — Other Ambulatory Visit: Payer: Self-pay | Admitting: Internal Medicine

## 2019-01-06 ENCOUNTER — Other Ambulatory Visit: Payer: Self-pay

## 2019-01-06 ENCOUNTER — Other Ambulatory Visit (INDEPENDENT_AMBULATORY_CARE_PROVIDER_SITE_OTHER): Payer: BC Managed Care – PPO

## 2019-01-06 DIAGNOSIS — E0590 Thyrotoxicosis, unspecified without thyrotoxic crisis or storm: Secondary | ICD-10-CM

## 2019-01-06 LAB — T4, FREE: Free T4: 1.44 ng/dL (ref 0.60–1.60)

## 2019-01-06 LAB — TSH: TSH: <0.01 u[IU]/mL — ABNORMAL LOW (ref 0.35–4.50)

## 2019-01-07 ENCOUNTER — Other Ambulatory Visit: Payer: Self-pay | Admitting: Family Medicine

## 2019-01-07 DIAGNOSIS — I10 Essential (primary) hypertension: Secondary | ICD-10-CM

## 2019-01-08 LAB — TRAB (TSH RECEPTOR BINDING ANTIBODY): TRAB: 14.06 IU/L — ABNORMAL HIGH (ref <=2.00)

## 2019-01-12 ENCOUNTER — Encounter: Payer: Self-pay | Admitting: Family Medicine

## 2019-01-12 DIAGNOSIS — E782 Mixed hyperlipidemia: Secondary | ICD-10-CM

## 2019-01-12 MED ORDER — METOPROLOL SUCCINATE ER 100 MG PO TB24: ORAL_TABLET | ORAL | 0 refills | Status: DC

## 2019-01-26 DIAGNOSIS — E0500 Thyrotoxicosis with diffuse goiter without thyrotoxic crisis or storm: Secondary | ICD-10-CM | POA: Insufficient documentation

## 2019-01-26 NOTE — Progress Notes (Signed)
Start time: 3:18 End time: 3:33  Virtual Visit via Video Note  I connected with Sherry Huff on 01/27/2019 by a video enabled telemedicine application and verified that I am speaking with the correct person using two identifiers.  Location: Patient: at home Provider: office   I discussed the limitations of evaluation and management by telemedicine and the availability of in person appointments. The patient expressed understanding and agreed to proceed.  History of Present Illness:  Chief Complaint  Patient presents with  . Hypertension    VIRTUAL med check.    Patient presents for follow up on blood pressures. She was diagnosed with hyperthyroidism at her April visit, and at that time her blood pressure medication was changed from benazepril to metoprolol. Dose has been titrated up, and most recently it was increased from 100mg  to 150mg  on 6/23, after reviewing the BP/pulse list she sent in, which was above goal.  BP's were 129-165/70-91, mostly 140's-150's/85.  Pulse was usually 85-97, over 100 with activity (cooking/cleaning). Since her dose was increased, BP has been running 112/63 up to 154/84, mostly 120's-130's/70-80.  Pulse 65-80.  (once pulse 108 and BP 148/69 when mowing yard and very hot.) She has been a little more tired in the last couple of days.  Hyperthyroidism:  She is under the care of endocrinologist, and is taking methimazole 10mg .  Last free T4 in June was normal.  She had elevated Ab's, indicating Grave's disease. She has endo f/u scheduled for 02/08/2019.  +weight gain--she reports she regained the weight she had lost (from giving up soda).  Admits that she hasn't been getting regular exercise.   PMH, PSH, SH reviewed  Outpatient Encounter Medications as of 01/27/2019  Medication Sig Note  . aspirin 81 MG tablet Take 81 mg by mouth daily.     . hydrochlorothiazide (MICROZIDE) 12.5 MG capsule TAKE 1 CAPSULE (12.5 MG TOTAL) BY MOUTH DAILY.   . methimazole  (TAPAZOLE) 10 MG tablet Take 1 tablet (10 mg total) by mouth daily.   . metoprolol succinate (TOPROL-XL) 100 MG 24 hr tablet Take 1.5 tablets by mouth once daily (150mg  dose). Take with or immediately following a meal.   . Multiple Vitamin (MULTIVITAMIN) capsule Take 1 capsule by mouth daily.     . Omega-3 Fatty Acids (FISH OIL) 1000 MG CAPS Take 2 capsules by mouth daily.  11/18/2018: Has been out about a week  . POTASSIUM PO Take 1 tablet by mouth daily. 11/13/2017: OTC supplement  . rosuvastatin (CRESTOR) 40 MG tablet Take 1 tablet (40 mg total) by mouth daily.    Facility-Administered Encounter Medications as of 01/27/2019  Medication  . 0.9 %  sodium chloride infusion   Allergies  Allergen Reactions  . Codeine Nausea And Vomiting  . Penicillins Nausea And Vomiting  . Strawberry (Diagnostic) Rash    ROS: no fever, chills, URI symptoms, headaches, dizziness, chest pain, palpitations, shortness of breath. Some fatigue and weight gain per HPI.  No GI complaints, bleeding, bruising, rash or other concerns.    Observations/Objective:  BP 132/84   Pulse 66   Ht 5\' 4"  (1.626 m)   Wt 194 lb (88 kg)   BMI 33.30 kg/m   Wt Readings from Last 3 Encounters:  01/27/19 194 lb (88 kg)  11/13/17 179 lb (81.2 kg)  03/25/17 192 lb (87.1 kg)    Well appearing, pleasant female, in good spirits, in no distress Cranial nerves are grossly intact. Normal mood, affect, grooming, speech and eye contact.  Exam limited due to virtual nature of the visit.  Assessment and Plan:  Essential hypertension, benign - improved control; continue 150mg  of metoprolol   Graves disease - under care of endocrinologist; last free T4 WNL  Obesity (BMI 30.0-34.9) - regain of weight--?if related to thyroid being corrected, vs lack of exercise and changes related to COVID; encouraged healthy diet, regular exercise    F/u as scheduled for November.  Follow Up Instructions:    I discussed the assessment and  treatment plan with the patient. The patient was provided an opportunity to ask questions and all were answered. The patient agreed with the plan and demonstrated an understanding of the instructions.   The patient was advised to call back or seek an in-person evaluation if the symptoms worsen or if the condition fails to improve as anticipated.  I provided 15 minutes of non-face-to-face time during this encounter.   Vikki Ports, MD

## 2019-01-27 ENCOUNTER — Ambulatory Visit (INDEPENDENT_AMBULATORY_CARE_PROVIDER_SITE_OTHER): Payer: BC Managed Care – PPO | Admitting: Family Medicine

## 2019-01-27 ENCOUNTER — Encounter: Payer: Self-pay | Admitting: Family Medicine

## 2019-01-27 ENCOUNTER — Other Ambulatory Visit: Payer: Self-pay

## 2019-01-27 VITALS — BP 132/84 | HR 66 | Ht 64.0 in | Wt 194.0 lb

## 2019-01-27 DIAGNOSIS — I10 Essential (primary) hypertension: Secondary | ICD-10-CM | POA: Diagnosis not present

## 2019-01-27 DIAGNOSIS — E0500 Thyrotoxicosis with diffuse goiter without thyrotoxic crisis or storm: Secondary | ICD-10-CM | POA: Diagnosis not present

## 2019-01-27 DIAGNOSIS — E669 Obesity, unspecified: Secondary | ICD-10-CM

## 2019-01-27 NOTE — Patient Instructions (Signed)
Continue on you current medication (taking 150mg  total dose of the metoprolol, by taking 1.5 tablets of the 100mg  pill). Continue to periodically monitor your blood pressure and pulse, and contact me with any concerns.  Try and resume a regular exercise routine, even if it is inside, doing 15 minutes twice daily (we discussed walking in place, pretend jumprope, jumping jacks, dancing around to music, etc)  If fatigue continues to be an issue, and your endocrinologist doesn't think it is related to the thyroid, we can potentially try changing the time of day you take your medication (switching to the evening, if you are currently taking it in the morning).  Be sure to stay well hydrated, as the heat (and also allergies) can cause a lot of fatigue.  See you in November, but reach out before then with any concerns or questions!

## 2019-02-02 ENCOUNTER — Ambulatory Visit: Payer: BLUE CROSS/BLUE SHIELD | Admitting: Internal Medicine

## 2019-02-08 ENCOUNTER — Ambulatory Visit (INDEPENDENT_AMBULATORY_CARE_PROVIDER_SITE_OTHER): Payer: BC Managed Care – PPO | Admitting: Internal Medicine

## 2019-02-08 ENCOUNTER — Other Ambulatory Visit: Payer: Self-pay

## 2019-02-08 ENCOUNTER — Other Ambulatory Visit: Payer: Self-pay | Admitting: Family Medicine

## 2019-02-08 ENCOUNTER — Encounter: Payer: Self-pay | Admitting: Internal Medicine

## 2019-02-08 VITALS — BP 124/78 | HR 75 | Temp 98.1°F | Ht 64.0 in | Wt 198.4 lb

## 2019-02-08 DIAGNOSIS — E782 Mixed hyperlipidemia: Secondary | ICD-10-CM

## 2019-02-08 DIAGNOSIS — E0590 Thyrotoxicosis, unspecified without thyrotoxic crisis or storm: Secondary | ICD-10-CM | POA: Diagnosis not present

## 2019-02-08 DIAGNOSIS — E0500 Thyrotoxicosis with diffuse goiter without thyrotoxic crisis or storm: Secondary | ICD-10-CM | POA: Diagnosis not present

## 2019-02-08 LAB — TSH: TSH: <0.01 u[IU]/mL — ABNORMAL LOW (ref 0.35–4.50)

## 2019-02-08 LAB — T4, FREE: Free T4: 0.87 ng/dL (ref 0.60–1.60)

## 2019-02-08 NOTE — Progress Notes (Signed)
Name: Sherry Huff  MRN/ DOB: [id number], Nov 22, 1965    Age/ Sex: 53 y.o., female     PCP: Rita Ohara, MD   Reason for Endocrinology Evaluation: Hyperthyroidism     Initial Endocrinology Clinic Visit: 12/01/2018    PATIENT IDENTIFIER: [name] is a 53 y.o., female with a past medical history of HTN. She has followed with [facility] since 12/01/18 for consultative assistance with management of her hyperthyroidism.   HISTORICAL SUMMARY: The patient was first diagnosed with hyperthyroidism in 10/2018 with a suppressed TSH < 0.006 uIU/mL and elevated FT4 of 4.10 ng/dL. Pt was having palpitations and sob for ~ 2 months prior to presentations.   In review of her records she has a low TSH in 10/2017 at 0.367 uIU/mL with normal T4.    Methimazole started 11/2018 SUBJECTIVE:   During last visit (12/01/2018): Methimazole started .  Today (02/08/2019):  Sherry Huff is here for a 2 month follow up on hyperthyroidism.   Denies nausea, diarrhea or abdominal pain Denies local neck symptoms.   Has noted weight gain, but no constipation. She has occasional difficulty swallowing of liquids  Denies depression.  Has dry eyes.     ROS:  As per HPI.   HISTORY:  Past Medical History:  Past Medical History:  Diagnosis Date  . Allergy   . Colon polyp 03/2017   tubular adenoma; repeat colonoscopy in 03/2022  . Depression    in the past was on Prozac not presently   . GERD (gastroesophageal reflux disease)    not on meds  . Hyperlipidemia   . Hypertension   . Tobacco use disorder    quit 07/19/2014   Past Surgical History:  Past Surgical History:  Procedure Laterality Date  . ABDOMINAL HYSTERECTOMY  2010   partial (with bladder sling); benign  . BUNIONECTOMY     left  . CESAREAN SECTION     x2  . GANGLION CYST EXCISION     left arm  . INCONTINENCE SURGERY  2010   Dr. Philis Pique    Social History:  reports that she quit smoking about 4 years  ago. Her smoking use included cigarettes. She has a 5.00 pack-year smoking history. She has never used smokeless tobacco. She reports current alcohol use. She reports that she does not use drugs. Family History:  Family History  Problem Relation Age of Onset  . Cancer Mother        died of leukemia  . Cancer Father        died of lung cancer  . Hypertension Father   . Depression Sister        bipolar  . Dementia Sister        related to self-inflected gunshot wound to head  . Hypertension Brother   . Hyperlipidemia Brother   . Heart disease Brother        CABG at 20  . Stroke Paternal Aunt   . Heart disease Paternal Uncle   . COPD Paternal Uncle   . Diabetes Maternal Aunt   . Cancer Paternal Grandmother        breast cancer  . Breast cancer Paternal Grandmother        in her 57's  . Colon polyps Neg Hx   . Colon cancer Neg Hx   . Esophageal cancer Neg Hx   . Rectal cancer Neg Hx   . Stomach cancer Neg Hx      HOME MEDICATIONS: Allergies as  of 02/08/2019      Reactions   Codeine Nausea And Vomiting   Penicillins Nausea And Vomiting   Strawberry (diagnostic) Rash      Medication List       Accurate as of February 08, 2019  3:02 PM. If you have any questions, ask your nurse or doctor.        aspirin 81 MG tablet Take 81 mg by mouth daily.   Fish Oil 1000 MG Caps Take 2 capsules by mouth daily.   hydrochlorothiazide 12.5 MG capsule Commonly known as: MICROZIDE TAKE 1 CAPSULE (12.5 MG TOTAL) BY MOUTH DAILY.   methimazole 10 MG tablet Commonly known as: TAPAZOLE Take 1 tablet (10 mg total) by mouth daily.   metoprolol succinate 100 MG 24 hr tablet Commonly known as: TOPROL-XL TAKE 1.5 TABLETS BY MOUTH ONCE DAILY (150MG  DOSE). TAKE WITH OR IMMEDIATELY FOLLOWING A MEAL.   multivitamin capsule Take 1 capsule by mouth daily.   POTASSIUM PO Take 1 tablet by mouth daily.   rosuvastatin 40 MG tablet Commonly known as: CRESTOR Take 1 tablet (40 mg total) by  mouth daily.         OBJECTIVE:   PHYSICAL EXAM: VS: BP 124/78 (BP Location: Left Arm, Patient Position: Sitting, Cuff Size: Large)   Pulse 75   Temp 98.1 F (36.7 C)   Ht 5\' 4"  (1.626 m)   Wt 198 lb 6.4 oz (90 kg)   SpO2 96%   BMI 34.06 kg/m    EXAM: General: Pt appears well and is in NAD  Eyes: External eye exam normal without stare, lid lag or exophthalmos.  EOM intact.  PERRL.  Neck: General: Supple without adenopathy. Thyroid: Prominent Thyroid.  No goiter or nodules appreciated. No thyroid bruit.  Lungs: Clear with good BS bilat with no rales, rhonchi, or wheezes  Heart: Auscultation: RRR.  Abdomen: Normoactive bowel sounds, soft, nontender, without masses or organomegaly palpable  Extremities:  BL LE: No pretibial edema normal ROM and strength.  Neuro: Cranial nerves: II - XII grossly intact  Motor: Normal strength throughout DTRs: 2+ and symmetric in UE without delay in relaxation phase  Mental Status: Judgment, insight: Intact Orientation: Oriented to time, place, and person Mood and affect: No depression, anxiety, or agitation     DATA REVIEWED: Results for Sherry Huff, Sherry Huff (MRN [id number]) as of 02/09/2019 12:31  Ref. Range 11/13/2017 10:49 11/16/2018 09:20 01/06/2019 10:26 02/08/2019 15:04  TSH Latest Ref Range: 0.35 - 4.50 uIU/mL 0.367 (L) <0.006 (L) <0.01 (L) <0.01 (L)  T4,Free(Direct) Latest Ref Range: 0.60 - 1.60 ng/dL  4.10 (H) 1.44 0.87   Results for Sherry Huff, Sherry Huff (MRN [id number]) as of 02/08/2019 13:07  Ref. Range 01/06/2019 10:34  TRAB Latest Ref Range: <=2.00 IU/L 14.06 (H)     ASSESSMENT / PLAN / RECOMMENDATIONS:   1. Hyperthyroidism Secondary to Graves' Disease:  - Clinically she is euthyroid  - Denies local neck symptoms -  Repeat TFT's show stability, will continue current dose of methimazole   Medications  Methimazole 10 mg daily   2. Graves' Disease.   - No evidence of extra-thyroidal manifestations    3. Dysphagia to  liquids:  - Pt described difficult swallowing to liquids,pt to discussed this with PCP. Thyroid enlargement causes dysphagia to solids rather then liquids.     F/U in 3 months  Labs today and in 6 weeks    Signed electronically by: Mack Guise, MD  [facility]  [facility] [address], [place], [place] [address] Phone: [phone] FAX: [phone]      CC: Rita Ohara, [place] [address] Phone: [phone]  Fax: [phone]   Return to Endocrinology clinic as below: Future Appointments  Date Time Provider [place]  05/26/2019  1:45 PM Rita Ohara, MD [facility] [place]

## 2019-02-09 ENCOUNTER — Encounter: Payer: Self-pay | Admitting: Internal Medicine

## 2019-02-13 ENCOUNTER — Other Ambulatory Visit: Payer: Self-pay | Admitting: Family Medicine

## 2019-02-13 DIAGNOSIS — E782 Mixed hyperlipidemia: Secondary | ICD-10-CM

## 2019-02-23 ENCOUNTER — Other Ambulatory Visit: Payer: Self-pay | Admitting: Internal Medicine

## 2019-03-07 ENCOUNTER — Other Ambulatory Visit: Payer: Self-pay | Admitting: Family Medicine

## 2019-03-07 DIAGNOSIS — E782 Mixed hyperlipidemia: Secondary | ICD-10-CM

## 2019-03-22 ENCOUNTER — Other Ambulatory Visit (INDEPENDENT_AMBULATORY_CARE_PROVIDER_SITE_OTHER): Payer: BC Managed Care – PPO

## 2019-03-22 ENCOUNTER — Other Ambulatory Visit: Payer: Self-pay

## 2019-03-22 DIAGNOSIS — E0590 Thyrotoxicosis, unspecified without thyrotoxic crisis or storm: Secondary | ICD-10-CM | POA: Diagnosis not present

## 2019-03-23 LAB — T4, FREE: Free T4: 0.67 ng/dL (ref 0.60–1.60)

## 2019-03-23 LAB — TSH: TSH: 0.18 u[IU]/mL — ABNORMAL LOW (ref 0.35–4.50)

## 2019-04-14 ENCOUNTER — Encounter: Payer: Self-pay | Admitting: Family Medicine

## 2019-05-05 ENCOUNTER — Other Ambulatory Visit: Payer: Self-pay

## 2019-05-07 ENCOUNTER — Other Ambulatory Visit: Payer: Self-pay

## 2019-05-07 ENCOUNTER — Encounter: Payer: Self-pay | Admitting: Internal Medicine

## 2019-05-07 ENCOUNTER — Ambulatory Visit (INDEPENDENT_AMBULATORY_CARE_PROVIDER_SITE_OTHER): Payer: BC Managed Care – PPO | Admitting: Internal Medicine

## 2019-05-07 VITALS — BP 130/84 | HR 63 | Ht 64.0 in | Wt 208.8 lb

## 2019-05-07 DIAGNOSIS — E0500 Thyrotoxicosis with diffuse goiter without thyrotoxic crisis or storm: Secondary | ICD-10-CM

## 2019-05-07 DIAGNOSIS — E0590 Thyrotoxicosis, unspecified without thyrotoxic crisis or storm: Secondary | ICD-10-CM

## 2019-05-07 MED ORDER — METHIMAZOLE 5 MG PO TABS: 10.0000 mg | ORAL_TABLET | Freq: Every day | ORAL | 6 refills | Status: DC

## 2019-05-07 NOTE — Patient Instructions (Signed)
We recommend that you follow these hyperthyroidism instructions at home:  1) Take Methimazole 10 mg once a day- We will switch it to 5 mg tablets  If you develop severe sore throat with high fevers OR develop unexplained yellowing of your skin, eyes, under your tongue, severe abdominal pain with nausea or vomiting --> then please get evaluated immediately.  2) Get repeat thyroid labs today and in 6 weeks .   It is ESSENTIAL to get follow-up labs to help avoid over or undertreatment of your hyperthyroidism - both of which can be dangerous to your health.

## 2019-05-07 NOTE — Progress Notes (Signed)
Name: Sherry Huff  MRN/ DOB: [id number], Jun 20, 1966    Age/ Sex: 53 y.o., female     PCP: Rita Ohara, MD   Reason for Endocrinology Evaluation: Hyperthyroidism     Initial Endocrinology Clinic Visit: 12/01/2018    PATIENT IDENTIFIER: Sherry Huff is a 53 y.o., female with a past medical history of HTN. She has followed with [facility] since 12/01/18 for consultative assistance with management of her hyperthyroidism.   HISTORICAL SUMMARY: The patient was first diagnosed with hyperthyroidism in 10/2018 with a suppressed TSH < 0.006 uIU/mL and elevated FT4 of 4.10 ng/dL. Pt was having palpitations and sob for ~ 2 months prior to presentations.   In review of her records she has a low TSH in 10/2017 at 0.367 uIU/mL with normal T4.    Methimazole started 11/2018 SUBJECTIVE:   During last visit (02/08/2019): Continued Methimazole 10 mg daily   Today (05/10/2019):  Sherry Huff is here for a 2 month follow up on hyperthyroidism.   Denies nausea, diarrhea or abdominal pain Denies local neck symptoms.   Has noted weight gain, but no constipation. Denies depression.  Has dry eyes.     ROS:  As per HPI.   HISTORY:  Past Medical History:  Past Medical History:  Diagnosis Date  . Allergy   . Colon polyp 03/2017   tubular adenoma; repeat colonoscopy in 03/2022  . Depression    in the past was on Prozac not presently   . GERD (gastroesophageal reflux disease)    not on meds  . Hyperlipidemia   . Hypertension   . Tobacco use disorder    quit 07/19/2014   Past Surgical History:  Past Surgical History:  Procedure Laterality Date  . ABDOMINAL HYSTERECTOMY  2010   partial (with bladder sling); benign  . BUNIONECTOMY     left  . CESAREAN SECTION     x2  . GANGLION CYST EXCISION     left arm  . INCONTINENCE SURGERY  2010   Dr. Philis Pique    Social History:  reports that she quit smoking about 4 years ago. Her smoking use included  cigarettes. She has a 5.00 pack-year smoking history. She has never used smokeless tobacco. She reports current alcohol use. She reports that she does not use drugs. Family History:  Family History  Problem Relation Age of Onset  . Cancer Mother        died of leukemia  . Cancer Father        died of lung cancer  . Hypertension Father   . Depression Sister        bipolar  . Dementia Sister        related to self-inflected gunshot wound to head  . Hypertension Brother   . Hyperlipidemia Brother   . Heart disease Brother        CABG at 58  . Stroke Paternal Aunt   . Heart disease Paternal Uncle   . COPD Paternal Uncle   . Diabetes Maternal Aunt   . Cancer Paternal Grandmother        breast cancer  . Breast cancer Paternal Grandmother        in her 48's  . Colon polyps Neg Hx   . Colon cancer Neg Hx   . Esophageal cancer Neg Hx   . Rectal cancer Neg Hx   . Stomach cancer Neg Hx      HOME MEDICATIONS: Allergies as of 05/07/2019  Reactions   Codeine Nausea And Vomiting   Penicillins Nausea And Vomiting   Strawberry (diagnostic) Rash      Medication List       Accurate as of May 07, 2019 11:59 PM. If you have any questions, ask your nurse or doctor.        aspirin 81 MG tablet Take 81 mg by mouth daily.   Fish Oil 1000 MG Caps Take 2 capsules by mouth daily.   hydrochlorothiazide 12.5 MG capsule Commonly known as: MICROZIDE TAKE 1 CAPSULE (12.5 MG TOTAL) BY MOUTH DAILY.   methimazole 5 MG tablet Commonly known as: TAPAZOLE Take 2 tablets (10 mg total) by mouth daily. What changed: medication strength Changed by: Dorita Sciara, MD   metoprolol succinate 100 MG 24 hr tablet Commonly known as: TOPROL-XL TAKE 1.5 TABLETS BY MOUTH ONCE DAILY (150MG  DOSE). TAKE WITH OR IMMEDIATELY FOLLOWING A MEAL.   multivitamin capsule Take 1 capsule by mouth daily.   POTASSIUM PO Take 1 tablet by mouth daily.   rosuvastatin 40 MG tablet Commonly known  as: CRESTOR Take 1 tablet (40 mg total) by mouth daily.         OBJECTIVE:   PHYSICAL EXAM: VS: BP 130/84 (BP Location: Left Arm, Patient Position: Sitting, Cuff Size: Normal)   Pulse 63   Ht 5\' 4"  (1.626 m)   Wt 208 lb 12.8 oz (94.7 kg)   SpO2 96%   BMI 35.84 kg/m    EXAM: General: Pt appears well and is in NAD  Eyes: External eye exam normal without stare, lid lag or exophthalmos.  EOM intact.  PERRL.  Neck: General: Supple without adenopathy. Thyroid: Prominent Thyroid.  No goiter or nodules appreciated. No thyroid bruit.  Lungs: Clear with good BS bilat with no rales, rhonchi, or wheezes  Heart: Auscultation: RRR.  Abdomen: Normoactive bowel sounds, soft, nontender, without masses or organomegaly palpable  Extremities:  BL LE: No pretibial edema normal ROM and strength.     DATA REVIEWED: Results for Sherry Huff, Sherry Huff (MRN [id number]) as of 05/10/2019 17:39  Ref. Range 05/07/2019 15:48  TSH Latest Units: mIU/L 12.73 (H)  T4,Free(Direct) Latest Ref Range: 0.8 - 1.8 ng/dL 0.9    Results for Sherry Huff, Sherry Huff (MRN [id number]) as of 02/08/2019 13:07  Ref. Range 01/06/2019 10:34  TRAB Latest Ref Range: <=2.00 IU/L 14.06 (H)     ASSESSMENT / PLAN / RECOMMENDATIONS:   1. Hyperthyroidism Secondary to Graves' Disease:  - Clinically she is euthyroid  - Denies local neck symptoms - Repeat TFT's show elevated TSH but normal FT4, will reduce methimazole dose as below   Medications  Stop Methimazole 10 mg  Start Methimazole 5 mg tabs at 1.5 tabs daily     2. Graves' Disease.   - No evidence of extra-thyroidal manifestations      F/U in 3 months  Labs today and in 6 weeks    Signed electronically by: Mack Guise, MD  [facility]  [facility] [address] [place], [place] [address] Phone: [phone] FAX: [phone]      CC: Rita Ohara, [place] [address] Phone:  [phone]  Fax: [phone]   Return to Endocrinology clinic as below: Future Appointments  Date Time Provider [place]  05/26/2019  1:45 PM Rita Ohara, MD [facility] [place]  06/22/2019 11:00 AM [facility] None  08/11/2019  3:20 PM Jaquann Guarisco, Melanie Crazier, MD [facility] None

## 2019-05-08 LAB — T4, FREE: Free T4: 0.9 ng/dL (ref 0.8–1.8)

## 2019-05-08 LAB — TSH: TSH: 12.73 mIU/L — ABNORMAL HIGH

## 2019-05-10 MED ORDER — METHIMAZOLE 5 MG PO TABS: 7.5000 mg | ORAL_TABLET | Freq: Every day | ORAL | 6 refills | Status: DC

## 2019-05-25 NOTE — Progress Notes (Signed)
Chief Complaint  Patient presents with  . Annual Exam    fasting annual exam with pelvic. Sees eye doctor for yearly eye exams. Does have some neck pain that she said had been happening x several years off and on. UA showed 1+ blood and trace protein-patient states that she does not have any symptoms.     Sherry Huff is a 53 y.o. female who presents for a complete physical.  She has the following concerns:  Dry, itchy rash on the left heel, spreading to the left side of the foot.  It had also been on the heel, but that part improved.  Neck pain--feels like she is hunched up all the time. Sometimes she even notices it in the morning, not relaxed.  She sits on computer most of the day.  She had a massage, helped only temporarily. She has taken ibuprofen, and sometimes the PM version at night, and she feels much more relaxed in the morning.   Radiates to the shoulders only, nothing down arms. No numbness, tingling, weakness.  No stress, posture changes or other reasons for pain. Has had this off/on for a couple of years.  Hypertension: BP medication was changed from benazepril to metoprolol when she was diagnosed with hyperthyroidism.  She has been on the '150mg'$  dose of metoprolol since June.  She is tolerating this without side effects.  She sent in list of BP's for August/September through [facility], and it was well controlled. She reports they continue to be the same, always in the "green" (yellow yesterday after walking).  Graves' disease.  She last saw her endocrinologist a few weeks ago, and methimazole dose was lowered from '10mg'$  to 7.'5mg'$  daily. She feels good, slightly constipated.  Vitamin D deficiency: Level was normal at 45 last year (10/2017). The year before, level had been low at 17.  She is currently taking MVI daily (which she had run out of the year that it was low). She continues on the same MVI daily.  Hyperlipidemia follow-up: Patient is reportedly following a low-fat, low  cholesterol diet. Denies side effects from the Crestor. Also takes omega-3 fishoilonce daily.She ran out of fish oil for a few days, back on for 3-4 days now. She has history of elevated TG, but that improved after cutting out soda.    Lab Results  Component Value Date   CHOL 111 11/16/2018   HDL 39 (L) 11/16/2018   LDLCALC 47 11/16/2018   TRIG 123 11/16/2018   CHOLHDL 2.8 11/16/2018   H/o Tobacco Abuse: She quit smoking 12/2015using Chantix. She only used it for 4-6 weeks.She gained 20# after shequit smoking, had trouble losing it, but finally lost weight 2 years ago after giving up soda.Lost weight with hyperthyroidism.  Regained it, during pandemic and related to treating her thyroid disease.  She has lost weight since recent endo visit--reports walking 33 miles in the last 11 days! Wt Readings from Last 3 Encounters:  05/07/19 208 lb 12.8 oz (94.7 kg)  02/08/19 198 lb 6.4 oz (90 kg)  01/27/19 194 lb (88 kg)  Weight was 179# at her physical in 10/2017.   Immunization History  Administered Date(s) Administered  . Influenza Split 06/04/2005, 06/19/2006, 04/25/2008  . Influenza,inj,Quad PF,6+ Mos 08/01/2014, 05/31/2015  . Pneumococcal Polysaccharide-23 10/19/2012  . Td 03/06/1995, 06/04/2005  . Tdap 10/19/2012   Unsure if she ever had chicken pox as a child (of note, husband had shingles recently). Last Pap smear:11/2009;no abnormal paps; had hysterectomy for benign reasons  Last mammogram:11/2017 Last colonoscopy: 03/2017, had tubular adenoma, needs f/u in 5 years (03/2022) Last DEXA: never  Dentist: regularly,every 6 months. Ophtho:borderline pressures noted in past, stable. Goes yearly, UTD. Exercise: Walks 2 miles (45 mins) most days in the morning, and walks another mile at lunch (just started this 11 days ago).  Had sporadic exercise prior.  No weight-bearing exercise.  Past Medical History:  Diagnosis Date  . Allergy   . Colon polyp 03/2017   tubular adenoma;  repeat colonoscopy in 03/2022  . Depression    in the past was on Prozac not presently   . GERD (gastroesophageal reflux disease)    not on meds  . Hyperlipidemia   . Hypertension   . Tobacco use disorder    quit 07/19/2014    Past Surgical History:  Procedure Laterality Date  . ABDOMINAL HYSTERECTOMY  2010   partial (with bladder sling); benign  . BUNIONECTOMY     left  . CESAREAN SECTION     x2  . GANGLION CYST EXCISION     left arm  . INCONTINENCE SURGERY  2010   Dr. Philis Pique    Social History   Socioeconomic History  . Marital status: Married    Spouse name: Not on file  . Number of children: 2  . Years of education: Not on file  . Highest education level: Not on file  Occupational History  . Occupation: Chiropodist: [employer]  . Financial resource strain: Not on file  . Food insecurity    Worry: Not on file    Inability: Not on file  . Transportation needs    Medical: Not on file    Non-medical: Not on file  Tobacco Use  . Smoking status: Former Smoker    Packs/day: 0.50    Years: 10.00    Pack years: 5.00    Types: Cigarettes    Quit date: 07/19/2014    Years since quitting: 4.8  . Smokeless tobacco: Never Used  Substance and Sexual Activity  . Alcohol use: Yes    Alcohol/week: 0.0 standard drinks    Comment: 1 drink a week on average  . Drug use: No  . Sexual activity: Yes    Partners: Male    Birth control/protection: Surgical  Lifestyle  . Physical activity    Days per week: Not on file    Minutes per session: Not on file  . Stress: Not on file  Relationships  . Social Herbalist on phone: Not on file    Gets together: Not on file    Attends religious service: Not on file    Active member of club or organization: Not on file    Attends meetings of clubs or organizations: Not on file    Relationship status: Not on file  . Intimate partner violence    Fear of current or ex partner: Not  on file    Emotionally abused: Not on file    Physically abused: Not on file    Forced sexual activity: Not on file  Other Topics Concern  . Not on file  Social History Narrative   Married, 2 sons (1 in [place], and a 50 yo).  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 3 dogs.   Quit smoking 06/2014    Family History  Problem Relation Age of Onset  . Cancer Mother  died of leukemia  . Cancer Father        died of lung cancer  . Hypertension Father   . Depression Sister        bipolar  . Dementia Sister        related to self-inflected gunshot wound to head  . Hypertension Brother   . Hyperlipidemia Brother   . Heart disease Brother        CABG at 3  . Stroke Paternal Aunt   . Heart disease Paternal Uncle   . COPD Paternal Uncle   . Diabetes Maternal Aunt   . Cancer Paternal Grandmother        breast cancer  . Breast cancer Paternal Grandmother        in her 28's  . Cancer Brother 55       bladder  . Colon polyps Neg Hx   . Colon cancer Neg Hx   . Esophageal cancer Neg Hx   . Rectal cancer Neg Hx   . Stomach cancer Neg Hx     Outpatient Encounter Medications as of 05/26/2019  Medication Sig Note  . aspirin 81 MG tablet Take 81 mg by mouth daily.     . hydrochlorothiazide (MICROZIDE) 12.5 MG capsule TAKE 1 CAPSULE (12.5 MG TOTAL) BY MOUTH DAILY.   . methimazole (TAPAZOLE) 5 MG tablet Take 1.5 tablets (7.5 mg total) by mouth daily.   . metoprolol succinate (TOPROL-XL) 100 MG 24 hr tablet TAKE 1.5 TABLETS BY MOUTH ONCE DAILY (150MG DOSE). TAKE WITH OR IMMEDIATELY FOLLOWING A MEAL.   . Multiple Vitamin (MULTIVITAMIN) capsule Take 1 capsule by mouth daily.     . Omega-3 Fatty Acids (FISH OIL) 1000 MG CAPS Take 2 capsules by mouth daily.    Marland Kitchen POTASSIUM PO Take 1 tablet by mouth daily. 11/13/2017: OTC supplement  . rosuvastatin (CRESTOR) 40 MG tablet Take 1 tablet (40 mg total) by mouth daily.    Facility-Administered  Encounter Medications as of 05/26/2019  Medication  . 0.9 %  sodium chloride infusion    Allergies  Allergen Reactions  . Codeine Nausea And Vomiting  . Penicillins Nausea And Vomiting  . Strawberry (Diagnostic) Rash   ROS: The patient denies anorexia, fever, vision changes, decreased hearing, ear pain, sore throat, breast concerns, chest pain, palpitations, dizziness, syncope, dyspnea on exertion, cough, swelling, nausea, vomiting, diarrhea, constipation, abdominal pain, melena, hematochezia, indigestion/heartburn, hematuria, incontinence, dysuria, vaginal bleeding, discharge, odor, genital lesions, joint pains, numbness, tingling, weakness, tremor, suspicious skin lesions, depression, anxiety, abnormal bleeding/bruising, or enlarged lymph nodes.  mild hot flashes, slight night sweats (not drenching)--improved Mild back pain wiith prolonged standing.  Some slight stress incontinence with cough/sneeze (much improved since bladder sling) Weight loss since med change and walking in the last few weeks. Constipation just in the last week. Slight congestion/allergies. Slight vaginal itch (external). Rash on left foot per HPI. Chronic neck pain per HPI.   PHYSICAL EXAM:  BP 120/86   Pulse 60   Temp (!) 97.5 F (36.4 C) (Other (Comment))   Ht 5' 3.75" (1.619 m)   Wt 203 lb 3.2 oz (92.2 kg)   BMI 35.15 kg/m   Wt Readings from Last 3 Encounters:  05/26/19 203 lb 3.2 oz (92.2 kg)  05/07/19 208 lb 12.8 oz (94.7 kg)  02/08/19 198 lb 6.4 oz (90 kg)     General Appearance:  Alert, cooperative, no distress, appears stated age   Head:  Normocephalic, without obvious abnormality, atraumatic   Eyes:  PERRL, conjunctiva/corneas clear, EOM's intact, fundi benign. No proptosis   Ears:  Normal TM's and external ear canals   Nose:  Not examined, wearing mask due to COVID-19 pandemic  Throat:  Not examined, wearing mask due to COVID-19 pandemic  Neck:  Supple, no lymphadenopathy;  thyroid: no enlargement/ tenderness/nodules; no carotid bruit or JVD. No muscle tenderness or spasm.  Back:  Spine nontender, no curvature, ROM normal, no CVA tenderness.  Lungs:  Clear to auscultation bilaterally without rales or ronchi;  respirations unlabored   Chest Wall:  No tenderness or deformity   Heart:  Regular rate and rhythm, S1 and S2 normal, no murmur, rub or gallop   Breast Exam:  No tenderness, masses, or nipple discharge or inversion. No axillary lymphadenopathy.   Abdomen:  Soft, non-tender, nondistended, normoactive bowel sounds, no masses, no hepatosplenomegaly   Genitalia:  External genitalia: Normal.  Minimally hypopigmented area on left labia majora (significantly improved from last year); no other lesions, no discharge. BUS and vagina normal.  Uterus absent, adnexa not enlarged, nontender, no masses, though exam is somewhat limited by body habitus. Pap not performed.  Rectal:  Normal sphincter tone, no masses; heme negative stool   Extremities:  No clubbing, cyanosis or edema   Pulses:  2+ and symmetric all extremities   Skin:  Skin color, texture, turgor normal, no lesions. Lateral aspect of the left foot has two dry, slightly pink, scaly patches of skin measuring 3.5 x 2cm (lateral, closer to the heel)  and 1.5 x 1.5cm (lateral mid foot)  Lymph nodes:  Cervical, supraclavicular, and axillary nodes normal   Neurologic:  CNII-XII intact, normal strength, sensation and gait; reflexes 2+ and symmetric throughout    Psych: Normal mood, affect, hygiene and grooming  Urine: 1+ blood, trace protein   ASSESSMENT/PLAN:  Annual physical exam - Plan: POCT Urinalysis DIP (Proadvantage Device), Comprehensive metabolic panel, CBC with Differential, Lipid panel  Essential hypertension, benign - well controlled; encouraged continued exercise, weight loss - Plan: Comprehensive metabolic panel  Graves disease - methimazole dose recently  adjusted by endo  Mixed hyperlipidemia - at goal on last check, cont statin, lowfat, low cholesterol diet - Plan: Lipid panel  Medication monitoring encounter - Plan: Comprehensive metabolic panel, CBC with Differential, Lipid panel  Need for influenza vaccination - Plan: Flu Vaccine QUAD 6+ mos PF IM (Fluarix Quad PF)  Chronic neck pain - discussed proper posture, stretches. Refer to PT - Plan: Ambulatory referral to Physical Therapy  Eczema, unspecified type - moisturize; OTC HC BID prn   c-met, cbc, lipids  Discussed monthly self breast exams and yearly mammograms (past due); at least 30 minutes of aerobic activity at least 5 days/week, weight-bearing exercise at least 2x/wk; proper sunscreen use reviewed; healthy diet, including goals of calcium and vitamin D intake and alcohol recommendations (less than or equal to 1 drink/day) reviewed; regular seatbelt use; changing batteries in smoke detectors. Immunization recommendations discussed--yearly flu shots recommended, given today.Shingrix recommended, to check with her insurance and schedule NV when convenient.Colonoscopy recommendations reviewed--UTD, repeat 03/2022.  Ask your family if any of the kids ever had chicken pox--if they did, you likely were exposed as well. We can always do a blood test to see if you are immune to chicken pox, prior to giving the shingles vaccine.  If you truly never had chicken pox, then you should be getting Varicella vaccine, not Shingrix.  The skin rash on your left foot looks like eczema.  Moisturizing this at  least 2-3 times daily should help. Consider using vaseline and wearing socks to bed.  You may use over-the-counter hydrocortisone cream twice daily if severe and itching.  We are referring you for physical therapy to help with your chronic neck pain. Posture, frequent stretches are important.  F/u 1 year, sooner prn based on labs

## 2019-05-25 NOTE — Patient Instructions (Addendum)
  HEALTH MAINTENANCE RECOMMENDATIONS:  It is recommended that you get at least 30 minutes of aerobic exercise at least 5 days/week (for weight loss, you may need as much as 60-90 minutes). This can be any activity that gets your heart rate up. This can be divided in 10-15 minute intervals if needed, but try and build up your endurance at least once a week.  Weight bearing exercise is also recommended twice weekly.  Eat a healthy diet with lots of vegetables, fruits and fiber.  "Colorful" foods have a lot of vitamins (ie green vegetables, tomatoes, red peppers, etc).  Limit sweet tea, regular sodas and alcoholic beverages, all of which has a lot of calories and sugar.  Up to 1 alcoholic drink daily may be beneficial for women (unless trying to lose weight, watch sugars).  Drink a lot of water.  Calcium recommendations are 1200-1500 mg daily (1500 mg for postmenopausal women or women without ovaries), and vitamin D 1000 IU daily.  This should be obtained from diet and/or supplements (vitamins), and calcium should not be taken all at once, but in divided doses.  Monthly self breast exams and yearly mammograms for women over the age of 31 is recommended.  Sunscreen of at least SPF 30 should be used on all sun-exposed parts of the skin when outside between the hours of 10 am and 4 pm (not just when at beach or pool, but even with exercise, golf, tennis, and yard work!)  Use a sunscreen that says "broad spectrum" so it covers both UVA and UVB rays, and make sure to reapply every 1-2 hours.  Remember to change the batteries in your smoke detectors when changing your clock times in the spring and fall. Carbon monoxide detectors are recommended for your home.  Use your seat belt every time you are in a car, and please drive safely and not be distracted with cell phones and texting while driving.  Please schedule your mammogram.  I recommend getting the new shingles vaccine (Shingrix). You will need to check  with your insurance to see if it is covered, and if covered, schedule a nurse visit at our office when convenient.  It is a series of 2 injections, spaced 2 months apart.  Ask your family if any of the kids ever had chicken pox--if they did, you likely were exposed as well. We can always do a blood test to see if you are immune to chicken pox, prior to giving the shingles vaccine.  If you truly never had chicken pox, then you should be getting Varicella vaccine, not Shingrix.    The skin rash on your left foot looks like eczema.  Moisturizing this at least 2-3 times daily should help. Consider using vaseline and wearing socks to bed.  You may use over-the-counter hydrocortisone cream twice daily if severe and itching.  We are referring you for physical therapy to help with your chronic neck pain. Posture, frequent stretches are important.

## 2019-05-26 ENCOUNTER — Encounter: Payer: Self-pay | Admitting: Family Medicine

## 2019-05-26 ENCOUNTER — Ambulatory Visit (INDEPENDENT_AMBULATORY_CARE_PROVIDER_SITE_OTHER): Payer: BC Managed Care – PPO | Admitting: Family Medicine

## 2019-05-26 ENCOUNTER — Other Ambulatory Visit: Payer: Self-pay

## 2019-05-26 VITALS — BP 120/86 | HR 60 | Temp 97.5°F | Ht 63.75 in | Wt 203.2 lb

## 2019-05-26 DIAGNOSIS — E0500 Thyrotoxicosis with diffuse goiter without thyrotoxic crisis or storm: Secondary | ICD-10-CM | POA: Diagnosis not present

## 2019-05-26 DIAGNOSIS — Z0000 Encounter for general adult medical examination without abnormal findings: Secondary | ICD-10-CM

## 2019-05-26 DIAGNOSIS — G8929 Other chronic pain: Secondary | ICD-10-CM

## 2019-05-26 DIAGNOSIS — Z5181 Encounter for therapeutic drug level monitoring: Secondary | ICD-10-CM | POA: Diagnosis not present

## 2019-05-26 DIAGNOSIS — L309 Dermatitis, unspecified: Secondary | ICD-10-CM

## 2019-05-26 DIAGNOSIS — I10 Essential (primary) hypertension: Secondary | ICD-10-CM | POA: Diagnosis not present

## 2019-05-26 DIAGNOSIS — Z23 Encounter for immunization: Secondary | ICD-10-CM

## 2019-05-26 DIAGNOSIS — E782 Mixed hyperlipidemia: Secondary | ICD-10-CM

## 2019-05-26 DIAGNOSIS — M542 Cervicalgia: Secondary | ICD-10-CM

## 2019-05-26 LAB — POCT URINALYSIS DIP (PROADVANTAGE DEVICE)
Bilirubin, UA: NEGATIVE
Glucose, UA: NEGATIVE mg/dL
Ketones, POC UA: NEGATIVE mg/dL
Leukocytes, UA: NEGATIVE
Nitrite, UA: NEGATIVE
Specific Gravity, Urine: 1.015
Urobilinogen, Ur: NEGATIVE
pH, UA: 6 (ref 5.0–8.0)

## 2019-05-27 LAB — COMPREHENSIVE METABOLIC PANEL
ALT: 18 IU/L (ref 0–32)
AST: 21 IU/L (ref 0–40)
Albumin/Globulin Ratio: 2.2 (ref 1.2–2.2)
Albumin: 5 g/dL — ABNORMAL HIGH (ref 3.8–4.9)
Alkaline Phosphatase: 101 IU/L (ref 39–117)
BUN/Creatinine Ratio: 16 (ref 9–23)
BUN: 15 mg/dL (ref 6–24)
Bilirubin Total: 0.8 mg/dL (ref 0.0–1.2)
CO2: 24 mmol/L (ref 20–29)
Calcium: 10.2 mg/dL (ref 8.7–10.2)
Chloride: 99 mmol/L (ref 96–106)
Creatinine, Ser: 0.93 mg/dL (ref 0.57–1.00)
GFR calc Af Amer: 82 mL/min/{1.73_m2} (ref >59)
GFR calc non Af Amer: 71 mL/min/{1.73_m2} (ref >59)
Globulin, Total: 2.3 g/dL (ref 1.5–4.5)
Glucose: 92 mg/dL (ref 65–99)
Potassium: 3.5 mmol/L (ref 3.5–5.2)
Sodium: 142 mmol/L (ref 134–144)
Total Protein: 7.3 g/dL (ref 6.0–8.5)

## 2019-05-27 LAB — LIPID PANEL
Chol/HDL Ratio: 3.4 ratio (ref 0.0–4.4)
Cholesterol, Total: 152 mg/dL (ref 100–199)
HDL: 45 mg/dL (ref >39)
LDL Chol Calc (NIH): 71 mg/dL (ref 0–99)
Triglycerides: 221 mg/dL — ABNORMAL HIGH (ref 0–149)
VLDL Cholesterol Cal: 36 mg/dL (ref 5–40)

## 2019-05-27 LAB — CBC WITH DIFFERENTIAL/PLATELET
Basophils Absolute: 0 10*3/uL (ref 0.0–0.2)
Basos: 1 %
EOS (ABSOLUTE): 0.2 10*3/uL (ref 0.0–0.4)
Eos: 4 %
Hematocrit: 45.8 % (ref 34.0–46.6)
Hemoglobin: 15.7 g/dL (ref 11.1–15.9)
Immature Grans (Abs): 0 10*3/uL (ref 0.0–0.1)
Immature Granulocytes: 0 %
Lymphocytes Absolute: 3.3 10*3/uL — ABNORMAL HIGH (ref 0.7–3.1)
Lymphs: 49 %
MCH: 29.2 pg (ref 26.6–33.0)
MCHC: 34.3 g/dL (ref 31.5–35.7)
MCV: 85 fL (ref 79–97)
Monocytes Absolute: 0.4 10*3/uL (ref 0.1–0.9)
Monocytes: 6 %
Neutrophils Absolute: 2.7 10*3/uL (ref 1.4–7.0)
Neutrophils: 40 %
Platelets: 245 10*3/uL (ref 150–450)
RBC: 5.38 x10E6/uL — ABNORMAL HIGH (ref 3.77–5.28)
RDW: 14.8 % (ref 11.7–15.4)
WBC: 6.6 10*3/uL (ref 3.4–10.8)

## 2019-06-14 ENCOUNTER — Encounter: Payer: Self-pay | Admitting: Physical Therapy

## 2019-06-14 ENCOUNTER — Ambulatory Visit: Payer: BC Managed Care – PPO | Attending: Family Medicine | Admitting: Physical Therapy

## 2019-06-14 ENCOUNTER — Other Ambulatory Visit: Payer: Self-pay

## 2019-06-14 DIAGNOSIS — M542 Cervicalgia: Secondary | ICD-10-CM | POA: Diagnosis not present

## 2019-06-14 DIAGNOSIS — R293 Abnormal posture: Secondary | ICD-10-CM | POA: Diagnosis present

## 2019-06-14 NOTE — Therapy (Addendum)
[facility] [place], [place], [address] Phone: [phone]   Fax:  [phone]  Physical Therapy Evaluation/Discharge   Patient Details  Name: Sherry Huff MRN: [id number] Date of Birth: 23-Nov-1965 Referring Provider (PT): Dr Rita Ohara    Encounter Date: 06/14/2019  PT End of Session - 06/14/19 1431    Visit Number  1    Number of Visits  6    Date for PT Re-Evaluation  07/26/19    Authorization Type  [employer]BS    PT Start Time  1416    PT Stop Time  1500    PT Time Calculation (min)  44 min    Activity Tolerance  Patient tolerated treatment well    Behavior During Therapy  [facility] for tasks assessed/performed       Past Medical History:  Diagnosis Date  . Allergy   . Colon polyp 03/2017   tubular adenoma; repeat colonoscopy in 03/2022  . Depression    in the past was on Prozac not presently   . GERD (gastroesophageal reflux disease)    not on meds  . Hyperlipidemia   . Hypertension   . Tobacco use disorder    quit 07/19/2014    Past Surgical History:  Procedure Laterality Date  . ABDOMINAL HYSTERECTOMY  2010   partial (with bladder sling); benign  . BUNIONECTOMY     left  . CESAREAN SECTION     x2  . GANGLION CYST EXCISION     left arm  . INCONTINENCE SURGERY  2010   Dr. Philis Pique    There were no vitals filed for this visit.   Subjective Assessment - 06/14/19 1424    Subjective  Patient reports a several year history of neck pain. Her neck flairs up at night then it can be several days before her pain comes down. She has bought some typ of neck strethcing device which has he lped.    Diagnostic tests  nothing for the neck    Patient Stated Goals  to have a plan for her neck    Currently in Pain?  Yes    Pain Score  5     Pain Location  Neck    Pain Orientation  Right;Left    Pain Descriptors / Indicators  Aching    Pain Type  Chronic pain    Pain Onset  More than a month ago    Pain  Frequency  Constant    Aggravating Factors   sleeping;    Pain Relieving Factors  walking; soft tissue mobilization         [facility] PT Assessment - 06/14/19 0001      Assessment   Medical Diagnosis  Cervical pain     Referring Provider (PT)  Dr Rita Ohara     Onset Date/Surgical Date  --   Has been in pain for several years    Hand Dominance  Right    Next MD Visit  Nothing scheudled    Prior Therapy  None       Precautions   Precautions  None      Restrictions   Weight Bearing Restrictions  No      Balance Screen   Has the patient fallen in the past 6 months  No    Has the patient had a decrease in activity level because of a fear of falling?   No    Is the patient reluctant to leave their home because  of a fear of falling?   No      Home Environment   Additional Comments  nothing pertinant       Prior Function   Level of Independence  Independent    Vocation  Full time employment    Vocation Requirements  analyist       Cognition   Overall Cognitive Status  Within Functional Limits for tasks assessed    Attention  Focused    Focused Attention  Appears intact    Memory  Appears intact    Awareness  Appears intact    Problem Solving  Appears intact      Observation/Other Assessments   Focus on Therapeutic Outcomes (FOTO)   44 % limitation      Sensation   Light Touch  Appears Intact      Coordination   Gross Motor Movements are Fluid and Coordinated  Yes    Fine Motor Movements are Fluid and Coordinated  Yes      ROM / Strength   AROM / PROM / Strength  AROM;PROM;Strength      AROM   Overall AROM Comments  full active ROM of UE     AROM Assessment Site  Cervical    Cervical Flexion  28    Cervical Extension  25    Cervical - Right Rotation  58    Cervical - Left Rotation  75      PROM   PROM Assessment Site  Cervical      Strength   Overall Strength Comments  gross UE 5/5       Palpation   Palpation comment  teder to palpation in the scalenes and  bilateral SCM                 Objective measurements completed on examination: See above findings.      [facility] Adult PT Treatment/Exercise - 06/14/19 0001      Neck Exercises: Standing   Other Standing Exercises  shoulder extension 2x10 yellow ; scpa retraction 2x10 yellow       Neck Exercises: Stretches   Upper Trapezius Stretch Limitations  2x20 sec hold bilateral     Levator Stretch Limitations  2x20 sec hold bilateral     Other Neck Stretches  scalene stretch with towell 2x20 sec hold mod cuing for tehcnique                PT Short Term Goals - 06/14/19 1650      PT SHORT TERM GOAL #1   Title  Patient will increase her cervical rotation by 15 degrees bilateral    Time  3    Period  Weeks    Status  New    Target Date  07/05/19      PT SHORT TERM GOAL #2   Title  Patient will be indepdnent with basic HEP    Time  3    Period  Weeks    Status  New    Target Date  07/05/19        PT Long Term Goals - 06/14/19 1652      PT LONG TERM GOAL #1   Title  Patient will sleep through the night without pain    Time  6    Period  Weeks    Status  New    Target Date  07/26/19      PT LONG TERM GOAL #2   Title  Patient will demonstrate full cervical  ROM without increased pain in order to drive    Time  6    Period  Weeks    Status  New      PT LONG TERM GOAL #3   Title  Patient will deonstrate a 34% limitation on FOTO    Time  6    Period  Weeks    Status  New    Target Date  07/26/19             Plan - 06/14/19 1432    Clinical Impression Statement  Patient is a 53 year old female with cervical spine pain. She had decreased motion with rotation and extension. She has increased pain at night. She would benefit from skilled therapy to reduce spasming and improve patients ability to sleep. She has a high co-pay so she may only be able to come for a few visits.    Personal Factors and Comorbidities  Profession;Time since onset of  injury/illness/exacerbation    Examination-Activity Limitations  Carry;Sleep;Reach Overhead    Stability/Clinical Decision Making  Stable/Uncomplicated    Clinical Decision Making  Low    Rehab Potential  Good    PT Frequency  1x / week    PT Duration  6 weeks    PT Treatment/Interventions  ADLs/Self Care Home Management;Electrical Stimulation;Moist Heat;Ultrasound       Patient will benefit from skilled therapeutic intervention in order to improve the following deficits and impairments:  Decreased strength, Postural dysfunction, Pain, Increased muscle spasms, Difficulty walking, Increased fascial restricitons, Decreased range of motion  Visit Diagnosis: Cervicalgia  Abnormal posture   PHYSICAL THERAPY DISCHARGE SUMMARY  Visits from Start of Care: 1  Current functional level related to goals / functional outcomes: Did not return    Remaining deficits: Unknown    Education / Equipment: Unknown   Plan: Patient agrees to discharge.  Patient goals were not met. Patient is being discharged due to not returning since the last visit.  ?????       Problem List Patient Active Problem List   Diagnosis Date Noted  . Graves disease 01/26/2019  . Hyperthyroidism 12/01/2018  . Vitamin D deficiency 11/07/2016  . Hypokalemia 11/07/2016  . Obesity, Class II, BMI 35-39.9, with comorbidity ([place]) 09/11/2015  . Essential hypertension, benign 10/19/2012  . Hyperlipidemia 06/18/2011  . Encounter for long-term (current) use of other medications 06/18/2011  . Need for prophylactic vaccination and inoculation against influenza 06/18/2011    Carney Living PT DPT  06/14/2019, 4:55 PM  [facility] [address] [place], [place], [address] Phone: [phone]   Fax:  [phone]  Name: Sherry Huff MRN: [id number] Date of Birth: 08/31/1965

## 2019-06-22 ENCOUNTER — Other Ambulatory Visit: Payer: Self-pay

## 2019-06-22 ENCOUNTER — Other Ambulatory Visit (INDEPENDENT_AMBULATORY_CARE_PROVIDER_SITE_OTHER): Payer: BC Managed Care – PPO

## 2019-06-22 DIAGNOSIS — E0590 Thyrotoxicosis, unspecified without thyrotoxic crisis or storm: Secondary | ICD-10-CM | POA: Diagnosis not present

## 2019-06-23 LAB — T4, FREE: Free T4: 0.73 ng/dL (ref 0.60–1.60)

## 2019-06-23 LAB — TSH: TSH: 9.9 u[IU]/mL — ABNORMAL HIGH (ref 0.35–4.50)

## 2019-06-23 MED ORDER — METHIMAZOLE 5 MG PO TABS: 5.0000 mg | ORAL_TABLET | Freq: Every day | ORAL | 6 refills | Status: DC

## 2019-06-28 ENCOUNTER — Ambulatory Visit: Payer: BC Managed Care – PPO | Admitting: Physical Therapy

## 2019-08-09 ENCOUNTER — Other Ambulatory Visit: Payer: Self-pay

## 2019-08-10 NOTE — Progress Notes (Signed)
Name: Sherry Huff  MRN/ DOB: [id number], Nov 14, 1965    Age/ Sex: 54 y.o., female     PCP: Rita Ohara, MD   Reason for Endocrinology Evaluation: Hyperthyroidism     Initial Endocrinology Clinic Visit: 12/01/2018    PATIENT IDENTIFIER: Sherry Huff is a 54 y.o., female with a past medical history of HTN. She has followed with [facility] since 12/01/18 for consultative assistance with management of her hyperthyroidism.   HISTORICAL SUMMARY: The patient was first diagnosed with hyperthyroidism in 10/2018 with a suppressed TSH < 0.006 uIU/mL and elevated FT4 of 4.10 ng/dL. Pt was having palpitations and sob for ~ 2 months prior to presentations.   In review of her records she has a low TSH in 10/2017 at 0.367 uIU/mL with normal T4.    Methimazole started 11/2018 SUBJECTIVE:   During last visit (05/07/2019): Decreased methimazole dose     Today (08/10/2019):  Ms. Schmuck is here for a 3 month follow up on hyperthyroidism.    Has been losing weight intentionally Denies constipaiton or depression  No local neck symotoms  Denies nausea, diarrhea or abdominal pain Has dry eyes, which is stable     ROS:  As per HPI.   HISTORY:  Past Medical History:  Past Medical History:  Diagnosis Date  . Allergy   . Colon polyp 03/2017   tubular adenoma; repeat colonoscopy in 03/2022  . Depression    in the past was on Prozac not presently   . GERD (gastroesophageal reflux disease)    not on meds  . Hyperlipidemia   . Hypertension   . Tobacco use disorder    quit 07/19/2014   Past Surgical History:  Past Surgical History:  Procedure Laterality Date  . ABDOMINAL HYSTERECTOMY  2010   partial (with bladder sling); benign  . BUNIONECTOMY     left  . CESAREAN SECTION     x2  . GANGLION CYST EXCISION     left arm  . INCONTINENCE SURGERY  2010   Dr. Philis Pique    Social History:  reports that she quit smoking about 5 years ago. Her smoking use  included cigarettes. She has a 5.00 pack-year smoking history. She has never used smokeless tobacco. She reports current alcohol use. She reports that she does not use drugs. Family History:  Family History  Problem Relation Age of Onset  . Cancer Mother        died of leukemia  . Cancer Father        died of lung cancer  . Hypertension Father   . Depression Sister        bipolar  . Dementia Sister        related to self-inflected gunshot wound to head  . Hypertension Brother   . Hyperlipidemia Brother   . Heart disease Brother        CABG at 58  . Stroke Paternal Aunt   . Heart disease Paternal Uncle   . COPD Paternal Uncle   . Diabetes Maternal Aunt   . Cancer Paternal Grandmother        breast cancer  . Breast cancer Paternal Grandmother        in her 50's  . Cancer Brother 55       bladder  . Colon polyps Neg Hx   . Colon cancer Neg Hx   . Esophageal cancer Neg Hx   . Rectal cancer Neg Hx   . Stomach cancer Neg  Hx      HOME MEDICATIONS: Allergies as of 08/11/2019      Reactions   Codeine Nausea And Vomiting   Penicillins Nausea And Vomiting   Strawberry (diagnostic) Rash      Medication List       Accurate as of August 10, 2019  3:40 PM. If you have any questions, ask your nurse or doctor.        aspirin 81 MG tablet Take 81 mg by mouth daily.   Fish Oil 1000 MG Caps Take 2 capsules by mouth daily.   hydrochlorothiazide 12.5 MG capsule Commonly known as: MICROZIDE TAKE 1 CAPSULE (12.5 MG TOTAL) BY MOUTH DAILY.   methimazole 5 MG tablet Commonly known as: TAPAZOLE Take 1 tablet (5 mg total) by mouth daily.   metoprolol succinate 100 MG 24 hr tablet Commonly known as: TOPROL-XL TAKE 1.5 TABLETS BY MOUTH ONCE DAILY (150MG  DOSE). TAKE WITH OR IMMEDIATELY FOLLOWING A MEAL.   multivitamin capsule Take 1 capsule by mouth daily.   POTASSIUM PO Take 1 tablet by mouth daily.   rosuvastatin 40 MG tablet Commonly known as: CRESTOR Take 1 tablet (40  mg total) by mouth daily.         OBJECTIVE:   PHYSICAL EXAM: VS: There were no vitals taken for this visit.   EXAM: General: Pt appears well and is in NAD  Eyes: External eye exam normal without stare, lid lag or exophthalmos.  EOM intact.  PERRL.  Neck: General: Supple without adenopathy. Thyroid: Prominent Thyroid.  No goiter or nodules appreciated. No thyroid bruit.  Lungs: Clear with good BS bilat with no rales, rhonchi, or wheezes  Heart: Auscultation: RRR.  Abdomen: Normoactive bowel sounds, soft, nontender, without masses or organomegaly palpable  Extremities:  BL LE: No pretibial edema normal ROM and strength.     DATA REVIEWED: Results for CARRIE, PROTSMAN (MRN [id number]) as of 08/12/2019 10:25  Ref. Range 05/07/2019 15:48 05/26/2019 15:04 06/22/2019 15:30  TSH Latest Ref Range: 0.35 - 4.50 uIU/mL 12.73 (H)  9.90 (H)  T4,Free(Direct) Latest Ref Range: 0.60 - 1.60 ng/dL 0.9  0.73     Results for RA, CLENDANIEL (MRN [id number]) as of 02/08/2019 13:07  Ref. Range 01/06/2019 10:34  TRAB Latest Ref Range: <=2.00 IU/L 14.06 (H)     ASSESSMENT / PLAN / RECOMMENDATIONS:   1. Hyperthyroidism Secondary to Graves' Disease:  - Clinically she is euthyroid  - Denies local neck symptoms - Will repeat labs  Next week ( no phlebotomist available today ) and will adjust the dose accordingly, we have been gradually reducing her methimazole dose which is a good sign.   Medications  Methimazole 5 mg ,daily    2. Graves' Disease.   - No evidence of extra-thyroidal manifestations      F/U in 4 months  Labs today and in 8 weeks    Signed electronically by: Mack Guise, MD  [facility]  [facility] [address] [place], [place] [address] Phone: [phone] FAX: [phone]      CC: Rita Ohara, [place] [address] Phone: [phone]  Fax: [phone]   Return to  Endocrinology clinic as below: Future Appointments  Date Time Provider [place]  08/11/2019  3:20 PM Saran Laviolette, Melanie Crazier, MD [facility] None  06/21/2020  8:45 AM Rita Ohara, MD [facility]

## 2019-08-11 ENCOUNTER — Ambulatory Visit (INDEPENDENT_AMBULATORY_CARE_PROVIDER_SITE_OTHER): Payer: BC Managed Care – PPO | Admitting: Internal Medicine

## 2019-08-11 ENCOUNTER — Encounter: Payer: Self-pay | Admitting: Internal Medicine

## 2019-08-11 VITALS — BP 110/68 | HR 59 | Temp 98.3°F | Wt 188.0 lb

## 2019-08-11 DIAGNOSIS — E0590 Thyrotoxicosis, unspecified without thyrotoxic crisis or storm: Secondary | ICD-10-CM

## 2019-08-11 DIAGNOSIS — E0500 Thyrotoxicosis with diffuse goiter without thyrotoxic crisis or storm: Secondary | ICD-10-CM | POA: Diagnosis not present

## 2019-08-11 NOTE — Patient Instructions (Signed)
-   Continue methimazole at 1 tablet daily

## 2019-08-12 ENCOUNTER — Encounter: Payer: Self-pay | Admitting: Internal Medicine

## 2019-08-18 ENCOUNTER — Other Ambulatory Visit: Payer: BC Managed Care – PPO

## 2019-08-19 ENCOUNTER — Other Ambulatory Visit: Payer: BC Managed Care – PPO

## 2019-08-20 ENCOUNTER — Other Ambulatory Visit: Payer: Self-pay

## 2019-08-20 ENCOUNTER — Other Ambulatory Visit (INDEPENDENT_AMBULATORY_CARE_PROVIDER_SITE_OTHER): Payer: BC Managed Care – PPO

## 2019-08-20 DIAGNOSIS — E0590 Thyrotoxicosis, unspecified without thyrotoxic crisis or storm: Secondary | ICD-10-CM

## 2019-08-20 LAB — T4, FREE: Free T4: 0.72 ng/dL (ref 0.60–1.60)

## 2019-08-20 LAB — TSH: TSH: 3.25 u[IU]/mL (ref 0.35–4.50)

## 2019-09-13 ENCOUNTER — Other Ambulatory Visit: Payer: Self-pay | Admitting: Family Medicine

## 2019-09-13 DIAGNOSIS — E782 Mixed hyperlipidemia: Secondary | ICD-10-CM

## 2019-10-06 ENCOUNTER — Other Ambulatory Visit: Payer: BC Managed Care – PPO

## 2019-10-07 ENCOUNTER — Other Ambulatory Visit: Payer: Self-pay

## 2019-10-07 ENCOUNTER — Other Ambulatory Visit (INDEPENDENT_AMBULATORY_CARE_PROVIDER_SITE_OTHER): Payer: BC Managed Care – PPO

## 2019-10-07 DIAGNOSIS — E0590 Thyrotoxicosis, unspecified without thyrotoxic crisis or storm: Secondary | ICD-10-CM | POA: Diagnosis not present

## 2019-10-07 LAB — T4, FREE: Free T4: 0.76 ng/dL (ref 0.60–1.60)

## 2019-10-07 LAB — TSH: TSH: 4.4 u[IU]/mL (ref 0.35–4.50)

## 2019-11-16 ENCOUNTER — Other Ambulatory Visit: Payer: Self-pay | Admitting: Internal Medicine

## 2019-11-29 ENCOUNTER — Other Ambulatory Visit: Payer: Self-pay | Admitting: Internal Medicine

## 2019-11-29 DIAGNOSIS — Z1231 Encounter for screening mammogram for malignant neoplasm of breast: Secondary | ICD-10-CM

## 2019-12-06 ENCOUNTER — Other Ambulatory Visit: Payer: Self-pay

## 2019-12-08 ENCOUNTER — Other Ambulatory Visit: Payer: Self-pay

## 2019-12-08 ENCOUNTER — Ambulatory Visit (INDEPENDENT_AMBULATORY_CARE_PROVIDER_SITE_OTHER): Payer: BC Managed Care – PPO | Admitting: Internal Medicine

## 2019-12-08 ENCOUNTER — Encounter: Payer: Self-pay | Admitting: Internal Medicine

## 2019-12-08 VITALS — BP 122/72 | HR 63 | Temp 98.2°F | Ht 64.0 in | Wt 172.5 lb

## 2019-12-08 DIAGNOSIS — E0590 Thyrotoxicosis, unspecified without thyrotoxic crisis or storm: Secondary | ICD-10-CM

## 2019-12-08 NOTE — Progress Notes (Signed)
Name: Sherry Huff  MRN/ DOB: [id number], 09-Jul-1966    Age/ Sex: 54 y.o., female     PCP: Rita Ohara, MD   Reason for Endocrinology Evaluation: Hyperthyroidism     Initial Endocrinology Clinic Visit: 12/01/2018    PATIENT IDENTIFIER: Sherry Huff is a 54 y.o., female with a past medical history of HTN. She has followed with [facility] since 12/01/18 for consultative assistance with management of her hyperthyroidism.   HISTORICAL SUMMARY: The patient was first diagnosed with hyperthyroidism in 10/2018 with a suppressed TSH < 0.006 uIU/mL and elevated FT4 of 4.10 ng/dL. Pt was having palpitations and sob for ~ 2 months prior to presentations.   In review of her records she has a low TSH in 10/2017 at 0.367 uIU/mL with normal T4.    Methimazole started 11/2018 SUBJECTIVE:   During last visit (08/11/2019): Decreased methimazole dose     Today (12/08/2019):  Sherry Huff is here for a 3 month follow up on hyperthyroidism.    Has been losing weight intentionally Denies diarrhea, or anxiety   She has noted constipation  No local neck symotoms     ROS:  As per HPI.   HISTORY:  Past Medical History:  Past Medical History:  Diagnosis Date  . Allergy   . Colon polyp 03/2017   tubular adenoma; repeat colonoscopy in 03/2022  . Depression    in the past was on Prozac not presently   . GERD (gastroesophageal reflux disease)    not on meds  . Hyperlipidemia   . Hypertension   . Tobacco use disorder    quit 07/19/2014   Past Surgical History:  Past Surgical History:  Procedure Laterality Date  . ABDOMINAL HYSTERECTOMY  2010   partial (with bladder sling); benign  . BUNIONECTOMY     left  . CESAREAN SECTION     x2  . GANGLION CYST EXCISION     left arm  . INCONTINENCE SURGERY  2010   Dr. Philis Pique    Social History:  reports that she quit smoking about 5 years ago. Her smoking use included cigarettes. She has a 5.00 pack-year smoking  history. She has never used smokeless tobacco. She reports current alcohol use. She reports that she does not use drugs. Family History:  Family History  Problem Relation Age of Onset  . Cancer Mother        died of leukemia  . Cancer Father        died of lung cancer  . Hypertension Father   . Depression Sister        bipolar  . Dementia Sister        related to self-inflected gunshot wound to head  . Hypertension Brother   . Hyperlipidemia Brother   . Heart disease Brother        CABG at 66  . Stroke Paternal Aunt   . Heart disease Paternal Uncle   . COPD Paternal Uncle   . Diabetes Maternal Aunt   . Cancer Paternal Grandmother        breast cancer  . Breast cancer Paternal Grandmother        in her 77's  . Cancer Brother 55       bladder  . Colon polyps Neg Hx   . Colon cancer Neg Hx   . Esophageal cancer Neg Hx   . Rectal cancer Neg Hx   . Stomach cancer Neg Hx      HOME  MEDICATIONS: Allergies as of 12/08/2019      Reactions   Codeine Nausea And Vomiting   Penicillins Nausea And Vomiting   Strawberry (diagnostic) Rash      Medication List       Accurate as of Dec 08, 2019  3:22 PM. If you have any questions, ask your nurse or doctor.        aspirin 81 MG tablet Take 81 mg by mouth daily.   Fish Oil 1000 MG Caps Take 2 capsules by mouth daily.   hydrochlorothiazide 12.5 MG capsule Commonly known as: MICROZIDE TAKE 1 CAPSULE (12.5 MG TOTAL) BY MOUTH DAILY.   methimazole 5 MG tablet Commonly known as: TAPAZOLE Take 0.5 tablets (2.5 mg total) by mouth daily.   metoprolol succinate 100 MG 24 hr tablet Commonly known as: TOPROL-XL TAKE 1.5 TABLETS BY MOUTH ONCE DAILY (150MG  DOSE). TAKE WITH OR IMMEDIATELY FOLLOWING A MEAL.   multivitamin capsule Take 1 capsule by mouth daily.   POTASSIUM PO Take 1 tablet by mouth daily.   rosuvastatin 40 MG tablet Commonly known as: CRESTOR Take 1 tablet (40 mg total) by mouth daily.         OBJECTIVE:     PHYSICAL EXAM: VS: BP 122/72 (BP Location: Left Arm, Patient Position: Sitting, Cuff Size: Normal)   Pulse 63   Temp 98.2 F (36.8 C)   Ht 5\' 4"  (1.626 m)   Wt 172 lb 8 oz (78.2 kg)   SpO2 96%   BMI 29.61 kg/m    EXAM: General: Pt appears well and is in NAD  Eyes: External eye exam normal without stare, lid lag or exophthalmos.  EOM intact.  PERRL.  Neck: General: Supple without adenopathy. Thyroid: Prominent Thyroid.  No goiter or nodules appreciated. No thyroid bruit.  Lungs: Clear with good BS bilat with no rales, rhonchi, or wheezes  Heart: Auscultation: RRR.  Abdomen: Normoactive bowel sounds, soft, nontender, without masses or organomegaly palpable  Extremities:  BL LE: No pretibial edema normal ROM and strength.     DATA REVIEWED:  Results for Sherry Huff, Sherry Huff (MRN [id number]) as of 12/10/2019 10:53  Ref. Range 10/07/2019 14:02 12/08/2019 15:42  TSH Latest Ref Range: 0.35 - 4.50 uIU/mL 4.40 3.96  T4,Free(Direct) Latest Ref Range: 0.60 - 1.60 ng/dL 0.76 0.77     Results for Sherry Huff, Sherry Huff (MRN [id number]) as of 02/08/2019 13:07  Ref. Range 01/06/2019 10:34  TRAB Latest Ref Range: <=2.00 IU/L 14.06 (H)     ASSESSMENT / PLAN / RECOMMENDATIONS:   1. Hyperthyroidism Secondary to Graves' Disease:  - Clinically she is euthyroid  - Denies local neck symptoms - Will adjust the dose as below     - Medications  Methimazole 5 mg ,HALf a Tablet Monday, Wednesday, Friday and Sunday    2. Graves' Disease.   - No evidence of extra-thyroidal manifestations      F/U in 4 months     Signed electronically by: Mack Guise, MD  [facility]in[facility]  [facility] [address] [place], [place] [address] Phone: [phone] FAX: [phone]      CC: Rita Ohara, [place] [address] Phone: [phone]  Fax: [phone]   Return to Endocrinology clinic as below: Future  Appointments  Date Time Provider [place]  12/21/2019  3:00 PM [facility] MM 2 [facility]  06/21/2020  8:45 AM Rita Ohara, MD [facility]

## 2019-12-08 NOTE — Patient Instructions (Signed)
-   Continue methimazole 5 mg, half a tablet daily

## 2019-12-09 LAB — TSH: TSH: 3.96 u[IU]/mL (ref 0.35–4.50)

## 2019-12-09 LAB — T4, FREE: Free T4: 0.77 ng/dL (ref 0.60–1.60)

## 2019-12-10 MED ORDER — METHIMAZOLE 5 MG PO TABS: 2.5000 mg | ORAL_TABLET | ORAL | 3 refills | Status: DC

## 2019-12-21 ENCOUNTER — Other Ambulatory Visit: Payer: Self-pay | Admitting: Family Medicine

## 2019-12-21 ENCOUNTER — Other Ambulatory Visit: Payer: Self-pay

## 2019-12-21 ENCOUNTER — Ambulatory Visit
Admission: RE | Admit: 2019-12-21 | Discharge: 2019-12-21 | Disposition: A | Discharge status: Home / Self Care | Payer: BC Managed Care – PPO | Source: Ambulatory Visit | Attending: Internal Medicine | Admitting: Internal Medicine

## 2019-12-21 DIAGNOSIS — Z1231 Encounter for screening mammogram for malignant neoplasm of breast: Secondary | ICD-10-CM

## 2019-12-30 ENCOUNTER — Other Ambulatory Visit: Payer: Self-pay | Admitting: Family Medicine

## 2019-12-30 DIAGNOSIS — I10 Essential (primary) hypertension: Secondary | ICD-10-CM

## 2020-01-03 ENCOUNTER — Encounter: Payer: Self-pay | Admitting: *Deleted

## 2020-01-22 ENCOUNTER — Other Ambulatory Visit: Payer: Self-pay | Admitting: Family Medicine

## 2020-01-22 DIAGNOSIS — E782 Mixed hyperlipidemia: Secondary | ICD-10-CM

## 2020-04-14 ENCOUNTER — Ambulatory Visit: Payer: BC Managed Care – PPO | Admitting: Internal Medicine

## 2020-05-31 ENCOUNTER — Other Ambulatory Visit: Payer: Self-pay | Admitting: Family Medicine

## 2020-05-31 DIAGNOSIS — E782 Mixed hyperlipidemia: Secondary | ICD-10-CM

## 2020-06-20 NOTE — Progress Notes (Signed)
Chief Complaint  Patient presents with  . Annual Exam    fasting annual with pelvic. No new concerns.     Sherry Huff is a 54 y.o. female who presents for a complete physical.    Last year she reported having a lot of neck pain, feeling "hunched up all the time". She is on the computer most of the day. She was referred to PT after last physical, only had 1 session. She has been doing home exercises, and neck pain is much better.  Hypertension: BP medication was changed from benazepril to metoprolol when she was diagnosed with hyperthyroidism.  She is taking 150 mg of metoprolol, in addition to HCTZ.  She denies side effects. BP's are not monitored elsewhere (just on her watch, which are all good).  BP Readings from Last 3 Encounters:  12/08/19 122/72  08/11/19 110/68  05/26/19 120/86   Graves' disease.  She last saw her endocrinologist in May. She was due to f/u in September, but cancelled the visit. Her dose of methimazole has been gradually lowered.  After her May labs, dose was lowered to 2.68m 4 days/week. She took it that way until October, when she stopped it completely.  She is due for recheck of labs. She has some mild constipation, otherwise no symptoms related to thyroid--no change in temperature intolerance, changes in hair/skin/nails/moods/energy.  Has had intentional weight loss.  Lab Results  Component Value Date   TSH 3.96 12/08/2019  free T4 was 0.77 at that time.  Vitamin D deficiency: Level was normal at 45 in 10/2017, when taking MVI daily. In 2018 had been low at 17 (when not taking MVI).   She continues on the same MVI daily.  Hyperlipidemia follow-up: Patient is reportedly following a low-fat, low cholesterol diet. Denies side effects from the Crestor. Also takes omega-3 fishoil 2 capsules daily. Lab Results  Component Value Date   CHOL 152 05/26/2019   HDL 45 05/26/2019   LDLCALC 71 05/26/2019   TRIG 221 (H) 05/26/2019   CHOLHDL 3.4 05/26/2019    H/o Tobacco Abuse: She quit smoking 12/2015using Chantix. She only used it for 4-6 weeks.She gained 20# after shequit smoking, had trouble losing it, but finally lost weight after giving up soda, also lost weight related to hyperthyroidism.  Once thyroid was adequately treated (and related to the pandemic), she regained the weight she lost. She has been able to lose the weight gain over the last year.  Wt Readings from Last 3 Encounters:  12/08/19 172 lb 8 oz (78.2 kg)  08/11/19 188 lb (85.3 kg)  05/26/19 203 lb 3.2 oz (92.2 kg)  Weight was 179# at her physical in 10/2017.   Immunization History  Administered Date(s) Administered  . Influenza Split 06/04/2005, 06/19/2006, 04/25/2008  . Influenza,inj,Quad PF,6+ Mos 08/01/2014, 05/31/2015, 05/26/2019  . PFIZER SARS-COV-2 Vaccination 12/29/2019, 01/19/2020  . Pneumococcal Polysaccharide-23 10/19/2012  . Td 03/06/1995, 06/04/2005  . Tdap 10/19/2012   Unsure if she ever had chicken pox as a child (of note, husband had shingles recently). Unsure if anybody in the family had as children.  Last Pap smear:11/2009;no abnormal paps; had hysterectomy for benign reasons Last mammogram:12/2019 Last colonoscopy: 03/2017, had tubular adenoma, needs f/u in 5 years (03/2022) Last DEXA: never  Dentist: regularly,every 6 months. Ophtho:borderline pressures noted in past, stable. Goes yearly, due now Exercise:  She walks 3 miles in the morning, 1.5 miles at lunch, 5 days/week (unless raining or meetings through lunch).  Also does yoga once  or twice a week (YouTube). Walks on the weekends also. Not doing regular weights since she is doing yoga regularly.  PMH, PSH, SH and FH were reviewed and updated  Outpatient Encounter Medications as of 06/21/2020  Medication Sig Note  . hydrochlorothiazide (MICROZIDE) 12.5 MG capsule TAKE 1 CAPSULE BY MOUTH EVERY DAY   . metoprolol succinate (TOPROL-XL) 100 MG 24 hr tablet TAKE 1.5 TABLETS BY MOUTH ONCE  DAILY (150MG DOSE). TAKE WITH OR IMMEDIATELY FOLLOWING A MEAL.   . Multiple Vitamin (MULTIVITAMIN) capsule Take 1 capsule by mouth daily.     . Omega-3 Fatty Acids (FISH OIL) 1000 MG CAPS Take 2 capsules by mouth daily.    Marland Kitchen POTASSIUM PO Take 1 tablet by mouth daily. 11/13/2017: OTC supplement  . Probiotic Product (PROBIOTIC DAILY PO) Take 1 capsule by mouth daily.   . rosuvastatin (CRESTOR) 40 MG tablet TAKE 1 TABLET BY MOUTH EVERY DAY   . [DISCONTINUED] aspirin 81 MG tablet Take 81 mg by mouth daily.     . [DISCONTINUED] methimazole (TAPAZOLE) 5 MG tablet Take 0.5 tablets (2.5 mg total) by mouth as directed. Half a tablet , four days a week ( Mon, Wed, Fri and Sunday )    Facility-Administered Encounter Medications as of 06/21/2020  Medication  . 0.9 %  sodium chloride infusion   Allergies  Allergen Reactions  . Codeine Nausea And Vomiting  . Penicillins Nausea And Vomiting  . Strawberry (Diagnostic) Rash    ROS: The patient denies anorexia, fever, vision changes, decreased hearing, ear pain, sore throat, breast concerns, chest pain, palpitations, dizziness, syncope, dyspnea on exertion, cough, swelling, nausea, vomiting, diarrhea, constipation, abdominal pain, melena, hematochezia, indigestion/heartburn, hematuria, incontinence, dysuria, vaginal bleeding, discharge, odor, genital lesions, joint pains, numbness, tingling, weakness, tremor, suspicious skin lesions, depression, anxiety, abnormal bleeding/bruising, or enlarged lymph nodes.  Mild hot flashes, only very occasional--had been more intense last year, prior to exercising regularly. Some vaginal dryness and itching externally, no discharge. Incontinence resolved after weight loss Slight congestion/allergies, chronic Neck pain resolved. Intentional weight loss   PHYSICAL EXAM:  BP 140/90   Pulse 64   Ht 5' 4"  (1.626 m)   Wt 167 lb (75.8 kg)   BMI 28.67 kg/m   128/80 on repeat by MD  Wt Readings from Last 3 Encounters:   06/21/20 167 lb (75.8 kg)  12/08/19 172 lb 8 oz (78.2 kg)  08/11/19 188 lb (85.3 kg)   Weight was 179# at her physical in 10/2017.   General Appearance:  Alert, cooperative, no distress, appears stated age   Head:  Normocephalic, without obvious abnormality, atraumatic   Eyes:  PERRL, conjunctiva/corneas clear, EOM's intact, fundi benign. No proptosis   Ears:  Normal TM's and external ear canals   Nose:  Not examined, wearing mask due to COVID-19 pandemic  Throat:  Not examined, wearing mask due to COVID-19 pandemic  Neck:  Supple, no lymphadenopathy; thyroid: no enlargement/ tenderness/nodules; no carotid bruit or JVD.   Back:  Spine nontender, no curvature, ROM normal, no CVA tenderness.  Lungs:  Clear to auscultation bilaterally without rales or ronchi;  respirations unlabored   Chest Wall:  No tenderness or deformity   Heart:  Regular rate and rhythm, S1 and S2 normal, no murmur, rub or gallop   Breast Exam:  No tenderness, masses, or nipple discharge or inversion. No axillary lymphadenopathy.   Abdomen:  Soft, non-tender, nondistended, normoactive bowel sounds, no masses, no hepatosplenomegaly   Genitalia:  External genitalia: Normal. There  is some inflammation/ erythema/excoriation at the R labia majora.  Normal on the left. No hypopigmentation noted.  BUS and vagina normal.  Uterus absent, adnexa not enlarged, nontender, no masses. Pap not performed.  Rectal:  Normal sphincter tone, no masses; heme negative stool   Extremities:  No clubbing, cyanosis or edema   Pulses:  2+ and symmetric all extremities   Skin:  Skin color, texture, turgor normal, no lesions or rash  Lymph nodes:  Cervical, supraclavicular, and axillary nodes normal   Neurologic:  Normal strength, sensation and gait; reflexes 2+ and symmetric throughout    Psych: Normal mood, affect, hygiene and grooming  Urine: 1+ blood, trace  protein   ASSESSMENT/PLAN:  Annual physical exam - Plan: POCT Urinalysis DIP (Proadvantage Device), Lipid panel, Comprehensive metabolic panel, CBC with Differential/Platelet, TSH, T4, Free, Hepatitis C antibody, Varicella zoster antibody, IgG  Essential hypertension, benign - controlled with current meds.  No SE, so will remain on BB and HCTZ - Plan: Comprehensive metabolic panel, hydrochlorothiazide (MICROZIDE) 12.5 MG capsule  Mixed hyperlipidemia - due for recheck. Cont statin, low cholesterol diet - Plan: Lipid panel  Graves disease - didn't f/u with endo, stopped methimazole in October, due for f/u. Discussed eventual hypothyroidism and need for Synthroid. No sx currently. - Plan: TSH, T4, Free  Vitamin D deficiency - continue daily MVI  Medication monitoring encounter - Plan: Lipid panel, Comprehensive metabolic panel, CBC with Differential/Platelet, TSH, T4, Free  Need for influenza vaccination - Plan: Flu Vaccine QUAD 6+ mos PF IM (Fluarix Quad PF)  Need for hepatitis C screening test - Plan: Hepatitis C antibody  Screening for viral disease - no h/o varicella or in her family/childhood. Check titers--if immune, start Shingrix. If non-immune, needs varicella series - Plan: Varicella zoster antibody, IgG  Microscopic hematuria - poss contaminant from irritation externally. Former smoker, h/o bladder CA in family. Return for recheck and refer to urology if persists.  Ddx reviewed. - Plan: Urinalysis, Routine w reflex microscopic  c-met, cbc, lipids, TSH, Free T4, HepC ab, Varicella IgG Declines HIV   Discussed monthly self breast exams and yearly mammograms (past due); at least 30 minutes of aerobic activity at least 5 days/week, weight-bearing exercise at least 2x/wk; proper sunscreen use reviewed; healthy diet, including goals of calcium and vitamin D intake and alcohol recommendations (less than or equal to 1 drink/day) reviewed; regular seatbelt use; changing batteries in  smoke detectors. Immunization recommendations discussed--yearly flu shots recommended, given today.Shingrix recommended, to check with her insurance and schedule NV when convenient.Colonoscopy recommendations reviewed--UTD, repeat 03/2022. Plan DEXA at age 23 due to h/o Graves.  F/u 1 year for CPE. May recommend 6 month f/u on thyroid if normal, if she doesn't plan to f/u with endo. Reviewed s/sx of hypothyroidism, to contact us sooner if they develop, for recheck.

## 2020-06-20 NOTE — Patient Instructions (Signed)
  HEALTH MAINTENANCE RECOMMENDATIONS:  It is recommended that you get at least 30 minutes of aerobic exercise at least 5 days/week (for weight loss, you may need as much as 60-90 minutes). This can be any activity that gets your heart rate up. This can be divided in 10-15 minute intervals if needed, but try and build up your endurance at least once a week.  Weight bearing exercise is also recommended twice weekly.  Eat a healthy diet with lots of vegetables, fruits and fiber.  "Colorful" foods have a lot of vitamins (ie green vegetables, tomatoes, red peppers, etc).  Limit sweet tea, regular sodas and alcoholic beverages, all of which has a lot of calories and sugar.  Up to 1 alcoholic drink daily may be beneficial for women (unless trying to lose weight, watch sugars).  Drink a lot of water.  Calcium recommendations are 1200-1500 mg daily (1500 mg for postmenopausal women or women without ovaries), and vitamin D 1000 IU daily.  This should be obtained from diet and/or supplements (vitamins), and calcium should not be taken all at once, but in divided doses.  Monthly self breast exams and yearly mammograms for women over the age of 98 is recommended.  Sunscreen of at least SPF 30 should be used on all sun-exposed parts of the skin when outside between the hours of 10 am and 4 pm (not just when at beach or pool, but even with exercise, golf, tennis, and yard work!)  Use a sunscreen that says "broad spectrum" so it covers both UVA and UVB rays, and make sure to reapply every 1-2 hours.  Remember to change the batteries in your smoke detectors when changing your clock times in the spring and fall. Carbon monoxide detectors are recommended for your home.  Use your seat belt every time you are in a car, and please drive safely and not be distracted with cell phones and texting while driving.  I recommend getting the new shingles vaccine (Shingrix).  If you have commercial insurance, check with your  insurance to verify what your out of pocket cost may be (usually covered as preventative, but better to verify to avoid any surprises, as this vaccine is expensive), and then schedule a nurse visit at our office when convenient (based on the possible side effects as discussed).   This is a series of 2 injections, spaced 2 months apart.  It doesn't have to be exactly 2 months apart (but can't be under 2 months), if that isn't feasible for your schedule, but try and get them close to 2 months (and definitely within 6 months of each other, or else the efficacy of the vaccine drops off).

## 2020-06-21 ENCOUNTER — Ambulatory Visit (INDEPENDENT_AMBULATORY_CARE_PROVIDER_SITE_OTHER): Payer: BC Managed Care – PPO | Admitting: Family Medicine

## 2020-06-21 ENCOUNTER — Other Ambulatory Visit: Payer: Self-pay

## 2020-06-21 ENCOUNTER — Encounter: Payer: Self-pay | Admitting: Family Medicine

## 2020-06-21 VITALS — BP 128/80 | HR 64 | Ht 64.0 in | Wt 167.0 lb

## 2020-06-21 DIAGNOSIS — E782 Mixed hyperlipidemia: Secondary | ICD-10-CM | POA: Diagnosis not present

## 2020-06-21 DIAGNOSIS — I10 Essential (primary) hypertension: Secondary | ICD-10-CM

## 2020-06-21 DIAGNOSIS — Z0000 Encounter for general adult medical examination without abnormal findings: Secondary | ICD-10-CM | POA: Diagnosis not present

## 2020-06-21 DIAGNOSIS — E559 Vitamin D deficiency, unspecified: Secondary | ICD-10-CM | POA: Diagnosis not present

## 2020-06-21 DIAGNOSIS — Z1159 Encounter for screening for other viral diseases: Secondary | ICD-10-CM

## 2020-06-21 DIAGNOSIS — Z5181 Encounter for therapeutic drug level monitoring: Secondary | ICD-10-CM

## 2020-06-21 DIAGNOSIS — D582 Other hemoglobinopathies: Secondary | ICD-10-CM | POA: Diagnosis not present

## 2020-06-21 DIAGNOSIS — Z23 Encounter for immunization: Secondary | ICD-10-CM | POA: Diagnosis not present

## 2020-06-21 DIAGNOSIS — R3129 Other microscopic hematuria: Secondary | ICD-10-CM

## 2020-06-21 DIAGNOSIS — E0500 Thyrotoxicosis with diffuse goiter without thyrotoxic crisis or storm: Secondary | ICD-10-CM | POA: Diagnosis not present

## 2020-06-21 LAB — POCT URINALYSIS DIP (PROADVANTAGE DEVICE)
Glucose, UA: NEGATIVE mg/dL
Ketones, POC UA: NEGATIVE mg/dL
Leukocytes, UA: NEGATIVE
Nitrite, UA: NEGATIVE
Protein Ur, POC: 30 mg/dL — AB
Specific Gravity, Urine: 1.025
Urobilinogen, Ur: NEGATIVE
pH, UA: 6 (ref 5.0–8.0)

## 2020-06-21 MED ORDER — HYDROCHLOROTHIAZIDE 12.5 MG PO CAPS: ORAL_CAPSULE | ORAL | 3 refills | Status: DC

## 2020-06-22 LAB — LIPID PANEL
Chol/HDL Ratio: 3.1 ratio (ref 0.0–4.4)
Cholesterol, Total: 153 mg/dL (ref 100–199)
HDL: 50 mg/dL (ref >39)
LDL Chol Calc (NIH): 75 mg/dL (ref 0–99)
Triglycerides: 166 mg/dL — ABNORMAL HIGH (ref 0–149)
VLDL Cholesterol Cal: 28 mg/dL (ref 5–40)

## 2020-06-22 LAB — COMPREHENSIVE METABOLIC PANEL
ALT: 22 IU/L (ref 0–32)
AST: 24 IU/L (ref 0–40)
Albumin/Globulin Ratio: 2.3 — ABNORMAL HIGH (ref 1.2–2.2)
Albumin: 5.1 g/dL — ABNORMAL HIGH (ref 3.8–4.9)
Alkaline Phosphatase: 64 IU/L (ref 44–121)
BUN/Creatinine Ratio: 21 (ref 9–23)
BUN: 19 mg/dL (ref 6–24)
Bilirubin Total: 0.6 mg/dL (ref 0.0–1.2)
CO2: 24 mmol/L (ref 20–29)
Calcium: 10.5 mg/dL — ABNORMAL HIGH (ref 8.7–10.2)
Chloride: 100 mmol/L (ref 96–106)
Creatinine, Ser: 0.9 mg/dL (ref 0.57–1.00)
GFR calc Af Amer: 84 mL/min/{1.73_m2} (ref >59)
GFR calc non Af Amer: 73 mL/min/{1.73_m2} (ref >59)
Globulin, Total: 2.2 g/dL (ref 1.5–4.5)
Glucose: 87 mg/dL (ref 65–99)
Potassium: 4 mmol/L (ref 3.5–5.2)
Sodium: 142 mmol/L (ref 134–144)
Total Protein: 7.3 g/dL (ref 6.0–8.5)

## 2020-06-22 LAB — TSH: TSH: 1.32 u[IU]/mL (ref 0.450–4.500)

## 2020-06-22 LAB — CBC WITH DIFFERENTIAL/PLATELET
Basophils Absolute: 0 10*3/uL (ref 0.0–0.2)
Basos: 0 %
EOS (ABSOLUTE): 0.2 10*3/uL (ref 0.0–0.4)
Eos: 3 %
Hematocrit: 50.8 % — ABNORMAL HIGH (ref 34.0–46.6)
Hemoglobin: 16.8 g/dL — ABNORMAL HIGH (ref 11.1–15.9)
Immature Grans (Abs): 0 10*3/uL (ref 0.0–0.1)
Immature Granulocytes: 0 %
Lymphocytes Absolute: 3.3 10*3/uL — ABNORMAL HIGH (ref 0.7–3.1)
Lymphs: 49 %
MCH: 28.8 pg (ref 26.6–33.0)
MCHC: 33.1 g/dL (ref 31.5–35.7)
MCV: 87 fL (ref 79–97)
Monocytes Absolute: 0.4 10*3/uL (ref 0.1–0.9)
Monocytes: 6 %
Neutrophils Absolute: 2.8 10*3/uL (ref 1.4–7.0)
Neutrophils: 42 %
Platelets: 267 10*3/uL (ref 150–450)
RBC: 5.83 x10E6/uL — ABNORMAL HIGH (ref 3.77–5.28)
RDW: 12.8 % (ref 11.7–15.4)
WBC: 6.6 10*3/uL (ref 3.4–10.8)

## 2020-06-22 LAB — VARICELLA ZOSTER ANTIBODY, IGG: Varicella zoster IgG: 1149 index (ref >165)

## 2020-06-22 LAB — HEPATITIS C ANTIBODY: Hep C Virus Ab: <0.1 s/co ratio (ref 0.0–0.9)

## 2020-06-22 LAB — T4, FREE: Free T4: 1.48 ng/dL (ref 0.82–1.77)

## 2020-06-22 NOTE — Addendum Note (Signed)
Addended by: Rita Ohara on: 06/22/2020 07:30 AM   Modules accepted: Orders

## 2020-07-12 ENCOUNTER — Other Ambulatory Visit: Payer: Self-pay

## 2020-07-12 ENCOUNTER — Other Ambulatory Visit: Payer: BC Managed Care – PPO

## 2020-07-12 DIAGNOSIS — D582 Other hemoglobinopathies: Secondary | ICD-10-CM

## 2020-07-12 DIAGNOSIS — R3129 Other microscopic hematuria: Secondary | ICD-10-CM

## 2020-07-13 LAB — URINALYSIS, ROUTINE W REFLEX MICROSCOPIC
Bilirubin, UA: NEGATIVE
Glucose, UA: NEGATIVE
Ketones, UA: NEGATIVE
Leukocytes,UA: NEGATIVE
Nitrite, UA: NEGATIVE
Protein,UA: NEGATIVE
RBC, UA: NEGATIVE
Specific Gravity, UA: 1.006 (ref 1.005–1.030)
Urobilinogen, Ur: 0.2 mg/dL (ref 0.2–1.0)
pH, UA: 6.5 (ref 5.0–7.5)

## 2020-07-13 LAB — CBC WITH DIFFERENTIAL/PLATELET
Basophils Absolute: 0 10*3/uL (ref 0.0–0.2)
Basos: 0 %
EOS (ABSOLUTE): 0.2 10*3/uL (ref 0.0–0.4)
Eos: 2 %
Hematocrit: 44.5 % (ref 34.0–46.6)
Hemoglobin: 15 g/dL (ref 11.1–15.9)
Immature Grans (Abs): 0 10*3/uL (ref 0.0–0.1)
Immature Granulocytes: 0 %
Lymphocytes Absolute: 3.4 10*3/uL — ABNORMAL HIGH (ref 0.7–3.1)
Lymphs: 41 %
MCH: 29.4 pg (ref 26.6–33.0)
MCHC: 33.7 g/dL (ref 31.5–35.7)
MCV: 87 fL (ref 79–97)
Monocytes Absolute: 0.5 10*3/uL (ref 0.1–0.9)
Monocytes: 6 %
Neutrophils Absolute: 4 10*3/uL (ref 1.4–7.0)
Neutrophils: 51 %
Platelets: 232 10*3/uL (ref 150–450)
RBC: 5.1 x10E6/uL (ref 3.77–5.28)
RDW: 12.6 % (ref 11.7–15.4)
WBC: 8.1 10*3/uL (ref 3.4–10.8)

## 2020-08-03 ENCOUNTER — Ambulatory Visit
Admission: RE | Admit: 2020-08-03 | Discharge: 2020-08-03 | Disposition: A | Discharge status: Home / Self Care | Payer: BC Managed Care – PPO | Source: Ambulatory Visit | Attending: Family Medicine | Admitting: Family Medicine

## 2020-08-03 ENCOUNTER — Other Ambulatory Visit: Payer: Self-pay

## 2020-08-03 ENCOUNTER — Ambulatory Visit (INDEPENDENT_AMBULATORY_CARE_PROVIDER_SITE_OTHER): Payer: BC Managed Care – PPO | Admitting: Family Medicine

## 2020-08-03 ENCOUNTER — Encounter: Payer: Self-pay | Admitting: Family Medicine

## 2020-08-03 VITALS — BP 132/80 | HR 60 | Ht 64.0 in | Wt 169.8 lb

## 2020-08-03 DIAGNOSIS — M25511 Pain in right shoulder: Secondary | ICD-10-CM

## 2020-08-03 MED ORDER — MELOXICAM 15 MG PO TABS: 15.0000 mg | ORAL_TABLET | Freq: Every day | ORAL | 0 refills | Status: DC

## 2020-08-03 NOTE — Patient Instructions (Signed)
Go to [facility] at [address] for x-rays. Take the meloxicam once daily with food. Do NOT take any ibuprofen or other anti-inflammatories while taking the meloxicam. Tylenol is okay to take. Cut the dose in half if it bothers your stomach. Continue to do range of motion exercises. Let us know if your pain isn't resolving in the next 2 weeks. Next steps would be either PT or orthopedic regerral.    Shoulder Range of Motion Exercises Shoulder range of motion (ROM) exercises are done to keep the shoulder moving freely or to increase movement. They are often recommended for people who have shoulder pain or stiffness or who are recovering from a shoulder surgery. Phase 1 exercises When you are able, do this exercise 1-2 times per day for 30-60 seconds in each direction, or as directed by your health care provider. Pendulum exercise To do this exercise while sitting: 1. Sit in a chair or at the edge of your bed with your feet flat on the floor. 2. Let your affected arm hang down in front of you over the edge of the bed or chair. 3. Relax your shoulder, arm, and hand. [place] your body so your arm gently swings in small circles. You can also use your unaffected arm to start the motion. 5. Repeat changing the direction of the circles, swinging your arm left and right, and swinging your arm forward and back. To do this exercise while standing: 1. Stand next to a sturdy chair or table, and hold on to it with your hand on your unaffected side. 2. Bend forward at the waist. 3. Bend your knees slightly. 4. Relax your shoulder, arm, and hand. 5. While keeping your shoulder relaxed, use body motion to swing your arm in small circles. 6. Repeat changing the direction of the circles, swinging your arm left and right, and swinging your arm forward and back. 7. Between exercises, stand up tall and take a short break to relax your lower back.   Phase 2 exercises Do these exercises 1-2  times per day or as told by your health care provider. Hold each stretch for 30 seconds, and repeat 3 times. Do the exercises with one or both arms as instructed by your health care provider. For these exercises, sit at a table with your hand and arm supported by the table. A chair that slides easily or has wheels can be helpful. External rotation 1. Turn your chair so that your affected side is nearest to the table. 2. Place your forearm on the table to your side. Bend your elbow about 90 at the elbow (right angle) and place your hand palm facing down on the table. Your elbow should be about 6 inches away from your side. 3. Keeping your arm on the table, lean your body forward. Abduction 1. Turn your chair so that your affected side is nearest to the table. 2. Place your forearm and hand on the table so that your thumb points toward the ceiling and your arm is straight out to your side. 3. Slide your hand out to the side and away from you, using your unaffected arm to do the work. 4. To increase the stretch, you can slide your chair away from the table. Flexion: forward stretch 1. Sit facing the table. Place your hand and elbow on the table in front of you. 2. Slide your hand forward and away from you, using your unaffected arm to do the work. 3. To increase the  stretch, you can slide your chair backward. Phase 3 exercises Do these exercises 1-2 times per day or as told by your health care provider. Hold each stretch for 30 seconds, and repeat 3 times. Do the exercises with one or both arms as instructed by your health care provider. Cross-body stretch: posterior capsule stretch 1. Lift your arm straight out in front of you. 2. Bend your arm 90 at the elbow (right angle) so your forearm moves across your body. 3. Use your other arm to gently pull the elbow across your body, toward your other shoulder. Wall climbs 1. Stand with your affected arm extended out to the side with your hand resting  on a door frame. 2. Slide your hand slowly up the door frame. 3. To increase the stretch, step through the door frame. Keep your body upright and do not lean. Wand exercises You will need a cane, a piece of PVC pipe, or a sturdy wooden dowel for wand exercises. Flexion To do this exercise while standing: 1. Hold the wand with both of your hands, palms down. 2. Using the other arm to help, lift your arms up and over your head, if able. 3. Push upward with your other arm to gently increase the stretch. To do this exercise while lying down: 1. Lie on your back with your elbows resting on the floor and the wand in both your hands. Your hands will be palm down, or pointing toward your feet. 2. Lift your hands toward the ceiling, using your unaffected arm to help if needed. 3. Bring your arms overhead as able, using your unaffected arm to help if needed. Internal rotation 1. Stand while holding the wand behind you with both hands. Your unaffected arm should be extended above your head with the arm of the affected side extended behind you at the level of your waist. The wand should be pointing straight up and down as you hold it. 2. Slowly pull the wand up behind your back by straightening the elbow of your unaffected arm and bending the elbow of your affected arm. External rotation 1. Lie on your back with your affected upper arm supported on a small pillow or rolled towel. When you first do this exercise, keep your upper arm close to your body. Over time, bring your arm up to a 90 angle out to the side. 2. Hold the wand across your stomach and with both hands palm up. Your elbow on your affected side should be bent at a 90 angle. 3. Use your unaffected side to help push your forearm away from you and toward the floor. Keep your elbow on your affected side bent at a 90 angle. Contact a health care provider if you have:  New or increasing pain.  New numbness, tingling, weakness, or discoloration  in your arm or hand. This information is not intended to replace advice given to you by your health care provider. Make sure you discuss any questions you have with your health care provider. Document Revised: 08/20/2017 Document Reviewed: 08/20/2017 Elsevier Patient Education  2021 [employer].
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
## 2020-08-17 ENCOUNTER — Telehealth: Payer: Self-pay | Admitting: *Deleted

## 2020-08-17 DIAGNOSIS — M79601 Pain in right arm: Secondary | ICD-10-CM

## 2020-08-17 DIAGNOSIS — M25511 Pain in right shoulder: Secondary | ICD-10-CM

## 2020-08-17 NOTE — Telephone Encounter (Signed)
Patient called and said her right arm is still aching very badly, keeping her awake at night. She is asking for a referral to ortho.

## 2020-08-17 NOTE — Telephone Encounter (Signed)
Patient would like to go to [facility] entered.

## 2020-08-17 NOTE — Telephone Encounter (Signed)
Ok to refer to ortho.  (we discussed this at visit).

## 2020-08-21 ENCOUNTER — Ambulatory Visit (INDEPENDENT_AMBULATORY_CARE_PROVIDER_SITE_OTHER): Payer: BC Managed Care – PPO | Admitting: Physician Assistant

## 2020-08-21 ENCOUNTER — Encounter: Payer: Self-pay | Admitting: Physician Assistant

## 2020-08-21 DIAGNOSIS — M25511 Pain in right shoulder: Secondary | ICD-10-CM | POA: Diagnosis not present

## 2020-08-21 MED ORDER — LIDOCAINE HCL 1 % IJ SOLN
3.0000 mL | INTRAMUSCULAR | Status: AC | PRN
  Administered 2020-08-21: 3 mL

## 2020-08-21 MED ORDER — LIDOCAINE HCL 1 % IJ SOLN
0.5000 mL | INTRAMUSCULAR | Status: AC | PRN
  Administered 2020-08-21: .5 mL

## 2020-08-21 MED ORDER — METHYLPREDNISOLONE ACETATE 40 MG/ML IJ SUSP
40.0000 mg | INTRAMUSCULAR | Status: AC | PRN
  Administered 2020-08-21: 40 mg via INTRA_ARTICULAR
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
## 2020-08-22 ENCOUNTER — Other Ambulatory Visit: Payer: Self-pay | Admitting: Family Medicine

## 2020-08-22 DIAGNOSIS — E782 Mixed hyperlipidemia: Secondary | ICD-10-CM

## 2020-09-04 ENCOUNTER — Encounter: Payer: Self-pay | Admitting: Physician Assistant

## 2020-09-04 ENCOUNTER — Ambulatory Visit (INDEPENDENT_AMBULATORY_CARE_PROVIDER_SITE_OTHER): Payer: BC Managed Care – PPO | Admitting: Physician Assistant

## 2020-09-04 DIAGNOSIS — M25511 Pain in right shoulder: Secondary | ICD-10-CM | POA: Diagnosis not present

## 2020-09-04 MED ORDER — DIAZEPAM 5 MG PO TABS: ORAL_TABLET | ORAL | 0 refills | Status: DC

## 2020-09-04 NOTE — Addendum Note (Signed)
Addended by: Robyne Peers on: 09/04/2020 03:05 PM   Modules accepted: Orders
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
## 2020-09-21 ENCOUNTER — Other Ambulatory Visit: Payer: Self-pay

## 2020-09-21 ENCOUNTER — Ambulatory Visit
Admission: RE | Admit: 2020-09-21 | Discharge: 2020-09-21 | Disposition: A | Discharge status: Home / Self Care | Payer: BC Managed Care – PPO | Source: Ambulatory Visit | Attending: Physician Assistant | Admitting: Physician Assistant

## 2020-09-21 DIAGNOSIS — M25511 Pain in right shoulder: Secondary | ICD-10-CM

## 2020-09-21 IMAGING — MR MR SHOULDER*R* W/O CM
4 of 5 series · 21 of 40 positions shown · non-contrast
Comparison: X-ray [DATE]

CLINICAL DATA: Right shoulder pain for 2 months after fall

EXAM:
MRI OF THE RIGHT SHOULDER WITHOUT CONTRAST
TECHNIQUE: Multiplanar, multisequence MR imaging of the shoulder was performed.
No intravenous contrast was administered.

[Series 6: T2 fat-sat · axial · right · 3.0mm · 0.59mm/px · z∈[-60,+41]mm · 8 of 29 slices shown (1 of 3)]
[im 1/29]
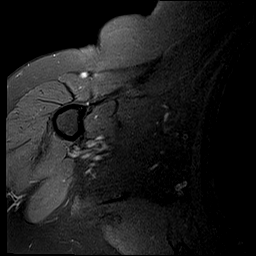
[im 4/29]
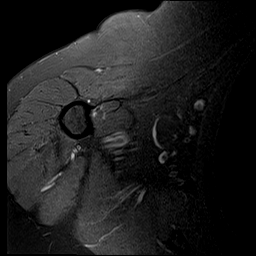
[im 10/29]
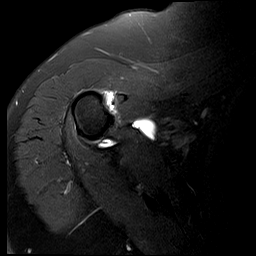
[im 13/29]
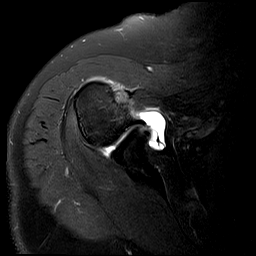
[im 16/29]
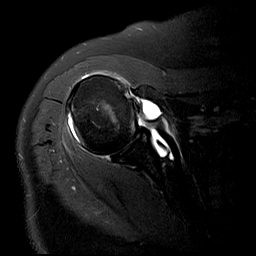
[im 19/29]
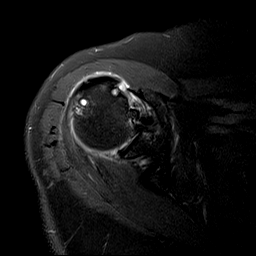
[im 25/29]
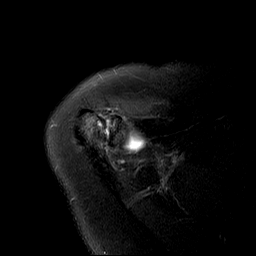
[im 29/29]
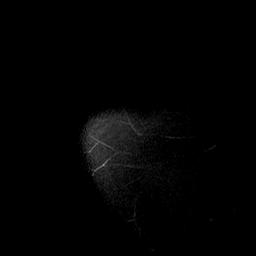

[Series 7: T2 fat-sat · oblique · right · 4.0mm · 0.22mm/px · 3 of 21 slices shown (2 of 3)]
[im 4/21]
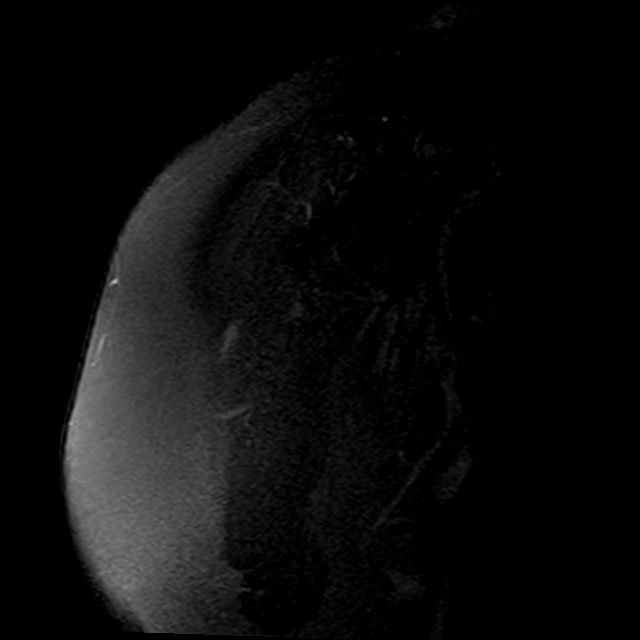
[im 11/21]
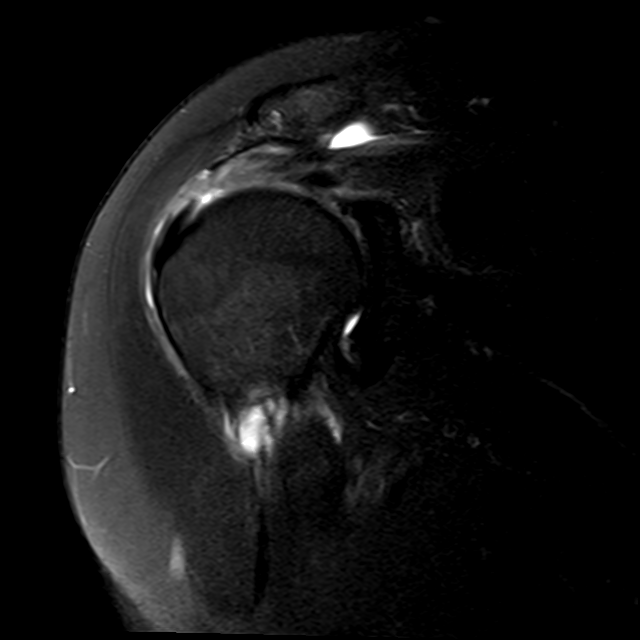
[im 17/21]
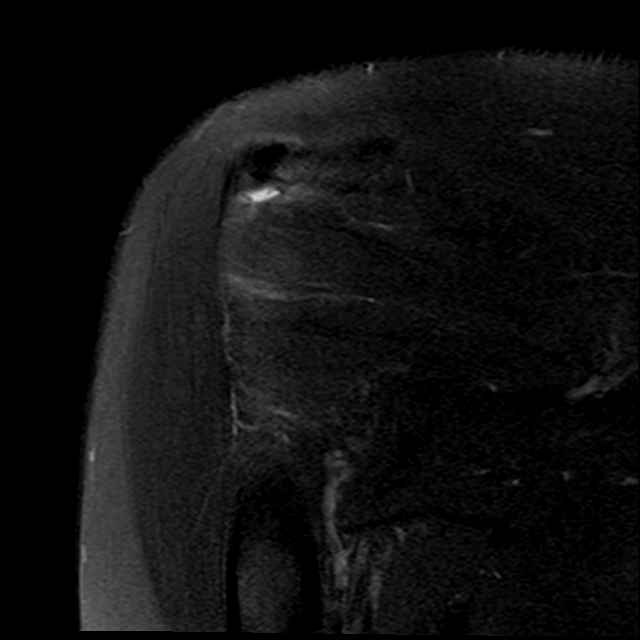

[Series 8: PD · oblique · right · 4.0mm · 0.22mm/px · 7 of 21 slices shown]
[im 1/21]
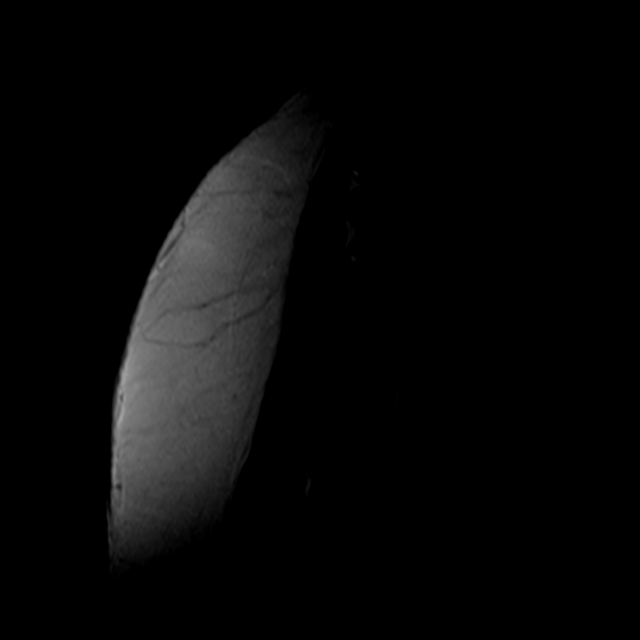
[im 4/21]
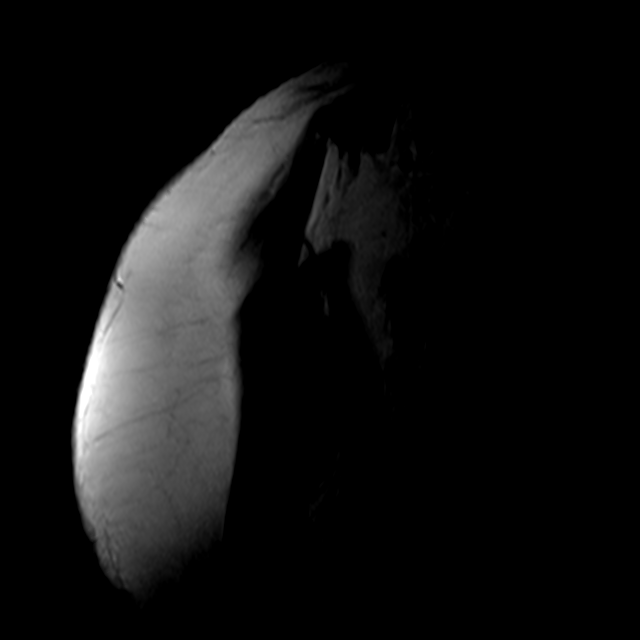
[im 7/21]
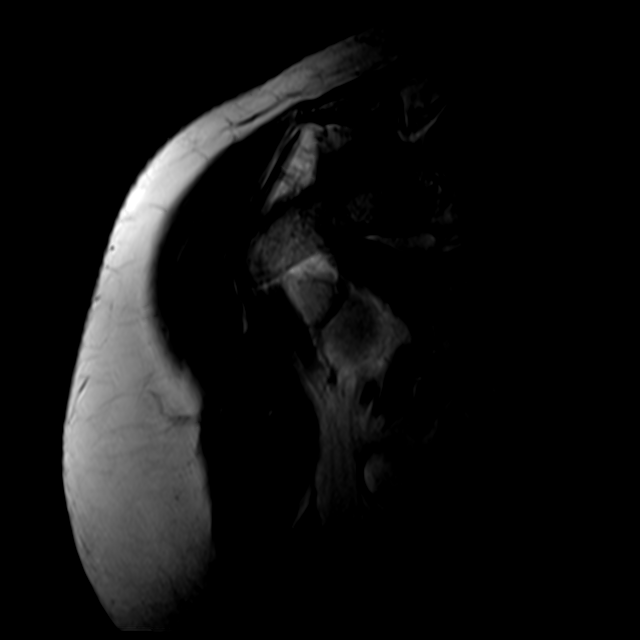
[im 11/21]
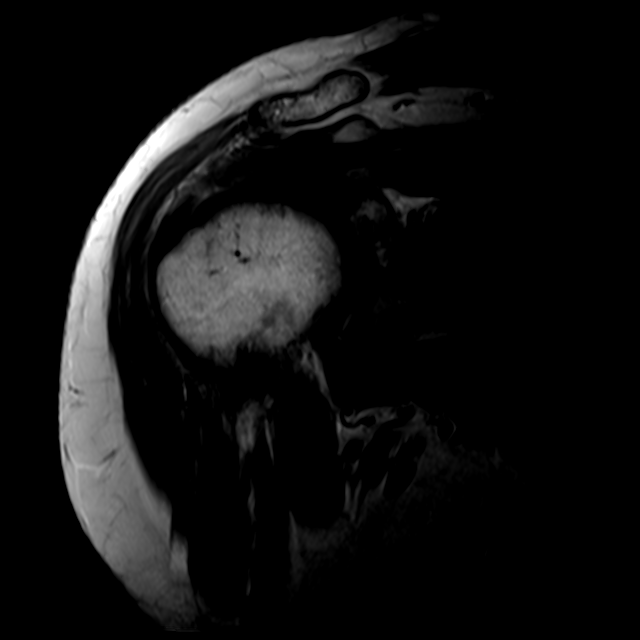
[im 14/21]
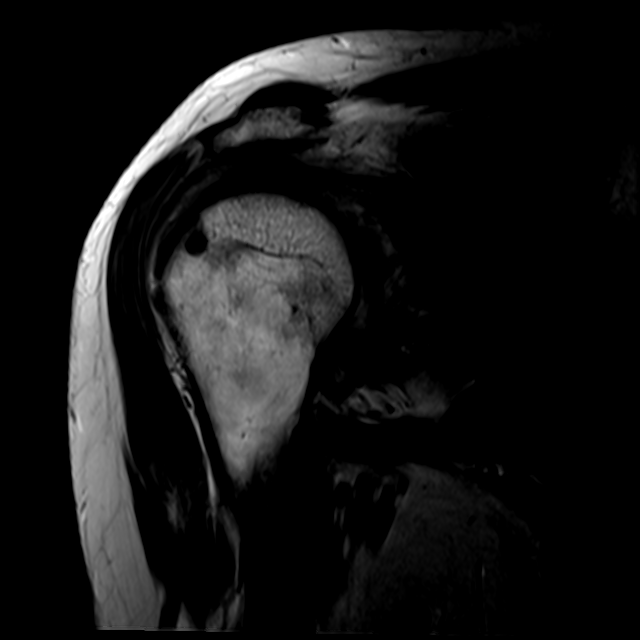
[im 17/21]
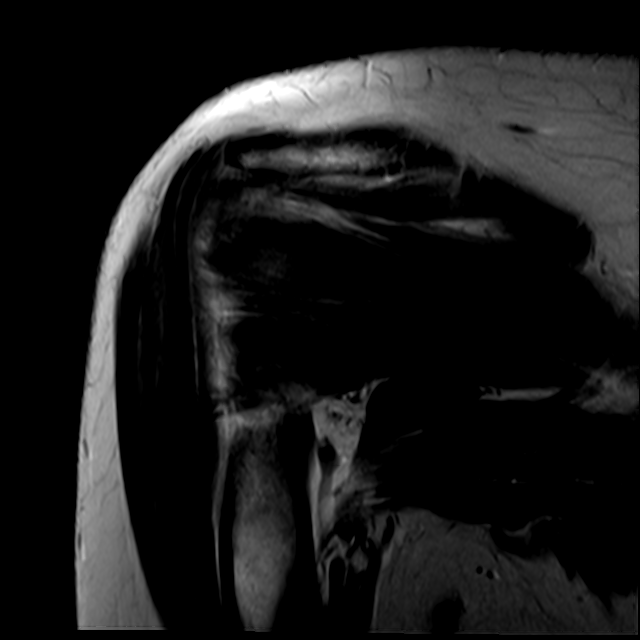
[im 21/21]
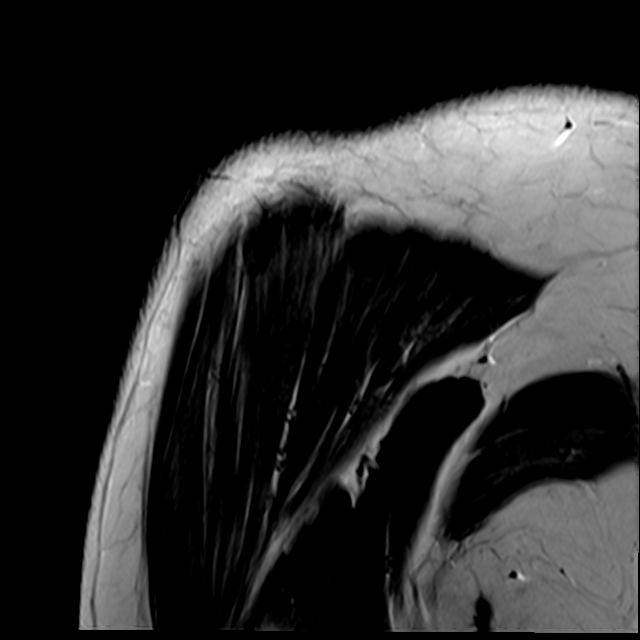

[Series 11: T2 fat-sat · coronal · right · 4.0mm · 0.55mm/px · 3 of 23 slices shown (3 of 3)]
[im 4/23]
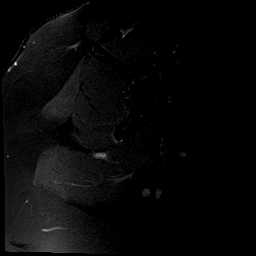
[im 13/23]
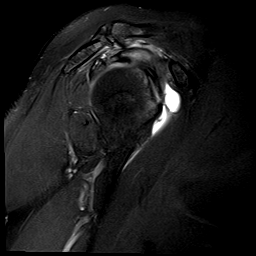
[im 19/23]
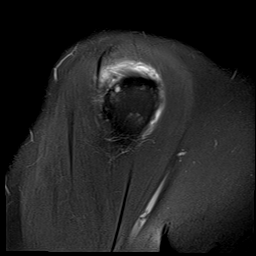

[21 of 40 positions shown; findings below may reference images not displayed]

FINDINGS: Rotator cuff: Full-thickness, near full width tear of the distal
supraspinatus tendon with retraction up to the level of the humeral
head apex. Tear measures 14 mm in AP dimension. Severe supraspinatus
tendinosis. Complete full-thickness subscapularis tendon tear with
retraction to the level of the glenohumeral joint. Moderate
infraspinatus tendinosis without tear. Intact teres minor.

Muscles: Advanced fatty atrophy of the subscapularis muscle.
Remaining rotator cuff musculature demonstrates preserved bulk.

Biceps long head: Suspect complete tear of the long head biceps
tendon. A portion of the tendon is seen dislocated anteriorly into
the glenohumeral joint (series 6, images 9-14).

Acromioclavicular Joint: No significant arthropathy of the AC joint.
Small volume subacromial-subdeltoid bursal fluid.

Glenohumeral Joint: Mild chondral thinning.  Small joint effusion.

Labrum: Irregularity of the superior labrum, which could reflect
degeneration and/or tearing.

Bones: No acute fracture. No dislocation. No bone marrow edema. No
suspicious bone lesion.

Other: None.
IMPRESSION: 1. Complete full-thickness subscapularis tendon tear with retraction
to the level of the glenohumeral joint. Advanced fatty atrophy of
the subscapularis muscle.
2. Full-thickness, near full-width tear of the distal supraspinatus
tendon with retraction up to the level of the humeral head apex.
Severe supraspinatus tendinosis.
3. Suspect complete tear of the long head biceps tendon. A portion
of the tendon is dislocated anteriorly into the glenohumeral joint.
4. Irregularity of the superior labrum, which could reflect
degeneration and/or tearing.

## 2020-09-25 ENCOUNTER — Ambulatory Visit (INDEPENDENT_AMBULATORY_CARE_PROVIDER_SITE_OTHER): Payer: BC Managed Care – PPO | Admitting: Orthopaedic Surgery

## 2020-09-25 ENCOUNTER — Encounter: Payer: Self-pay | Admitting: Orthopaedic Surgery

## 2020-09-25 DIAGNOSIS — S46011D Strain of muscle(s) and tendon(s) of the rotator cuff of right shoulder, subsequent encounter: Secondary | ICD-10-CM | POA: Diagnosis not present

## 2020-09-25 DIAGNOSIS — M25511 Pain in right shoulder: Secondary | ICD-10-CM | POA: Diagnosis not present
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
## 2020-09-28 ENCOUNTER — Ambulatory Visit (INDEPENDENT_AMBULATORY_CARE_PROVIDER_SITE_OTHER): Payer: BC Managed Care – PPO | Admitting: Orthopedic Surgery

## 2020-09-28 ENCOUNTER — Encounter: Payer: Self-pay | Admitting: Orthopedic Surgery

## 2020-09-28 DIAGNOSIS — S46011D Strain of muscle(s) and tendon(s) of the rotator cuff of right shoulder, subsequent encounter: Secondary | ICD-10-CM
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
## 2020-10-03 ENCOUNTER — Other Ambulatory Visit: Payer: Self-pay

## 2020-10-04 ENCOUNTER — Ambulatory Visit: Payer: BC Managed Care – PPO | Admitting: Orthopedic Surgery

## 2020-10-13 ENCOUNTER — Other Ambulatory Visit (HOSPITAL_COMMUNITY)
Admission: RE | Admit: 2020-10-13 | Discharge: 2020-10-13 | Disposition: A | Discharge status: Home / Self Care | Payer: BC Managed Care – PPO | Source: Ambulatory Visit | Attending: Orthopedic Surgery | Admitting: Orthopedic Surgery

## 2020-10-13 DIAGNOSIS — Z20822 Contact with and (suspected) exposure to covid-19: Secondary | ICD-10-CM | POA: Insufficient documentation

## 2020-10-13 DIAGNOSIS — Z01812 Encounter for preprocedural laboratory examination: Secondary | ICD-10-CM | POA: Insufficient documentation

## 2020-10-13 LAB — SARS CORONAVIRUS 2 (TAT 6-24 HRS): SARS Coronavirus 2: NEGATIVE

## 2020-10-16 ENCOUNTER — Encounter (HOSPITAL_COMMUNITY): Payer: Self-pay | Admitting: Orthopedic Surgery

## 2020-10-16 NOTE — Progress Notes (Signed)
EKG: DOS CXR: na ECHO: denies Stress Test: denies Cardiac Cath: denies  Fasting Blood Sugar- na Checks Blood Sugar__na_ times a day  OSA/CPAP:  No  ASA/Blood Thinners:  No  Covid test 3/25 negative  Anesthesia Review: No  Patient denies shortness of breath, fever, cough, and chest pain at PAT appointment.  Patient verbalized understanding of instructions provided today at the PAT appointment.  Patient asked to review instructions at home and day of surgery.

## 2020-10-17 ENCOUNTER — Other Ambulatory Visit: Payer: Self-pay

## 2020-10-17 ENCOUNTER — Encounter (HOSPITAL_COMMUNITY)
Admission: RE | Disposition: A | Discharge status: Home / Self Care | Payer: Self-pay | Source: Home / Self Care | Attending: Orthopedic Surgery

## 2020-10-17 ENCOUNTER — Encounter (HOSPITAL_COMMUNITY): Payer: Self-pay | Admitting: Orthopedic Surgery

## 2020-10-17 ENCOUNTER — Ambulatory Visit (HOSPITAL_COMMUNITY)
Admission: RE | Admit: 2020-10-17 | Discharge: 2020-10-17 | Disposition: A | Discharge status: Home / Self Care | Payer: BC Managed Care – PPO | Attending: Orthopedic Surgery | Admitting: Orthopedic Surgery

## 2020-10-17 ENCOUNTER — Ambulatory Visit (HOSPITAL_COMMUNITY): Payer: BC Managed Care – PPO | Admitting: Anesthesiology

## 2020-10-17 DIAGNOSIS — M75121 Complete rotator cuff tear or rupture of right shoulder, not specified as traumatic: Secondary | ICD-10-CM | POA: Diagnosis not present

## 2020-10-17 DIAGNOSIS — Z79899 Other long term (current) drug therapy: Secondary | ICD-10-CM | POA: Diagnosis not present

## 2020-10-17 DIAGNOSIS — S43431A Superior glenoid labrum lesion of right shoulder, initial encounter: Secondary | ICD-10-CM | POA: Diagnosis not present

## 2020-10-17 DIAGNOSIS — M75101 Unspecified rotator cuff tear or rupture of right shoulder, not specified as traumatic: Secondary | ICD-10-CM | POA: Insufficient documentation

## 2020-10-17 DIAGNOSIS — Z87891 Personal history of nicotine dependence: Secondary | ICD-10-CM | POA: Insufficient documentation

## 2020-10-17 DIAGNOSIS — S43003A Unspecified subluxation of unspecified shoulder joint, initial encounter: Secondary | ICD-10-CM

## 2020-10-17 DIAGNOSIS — Z880 Allergy status to penicillin: Secondary | ICD-10-CM | POA: Insufficient documentation

## 2020-10-17 DIAGNOSIS — Z7982 Long term (current) use of aspirin: Secondary | ICD-10-CM | POA: Insufficient documentation

## 2020-10-17 DIAGNOSIS — S46119A Strain of muscle, fascia and tendon of long head of biceps, unspecified arm, initial encounter: Secondary | ICD-10-CM

## 2020-10-17 DIAGNOSIS — S46111A Strain of muscle, fascia and tendon of long head of biceps, right arm, initial encounter: Secondary | ICD-10-CM | POA: Diagnosis not present

## 2020-10-17 DIAGNOSIS — Z885 Allergy status to narcotic agent status: Secondary | ICD-10-CM | POA: Diagnosis not present

## 2020-10-17 HISTORY — PX: SHOULDER ARTHROSCOPY WITH ROTATOR CUFF REPAIR AND SUBACROMIAL DECOMPRESSION: SHX5686

## 2020-10-17 LAB — CBC
HCT: 50.8 % — ABNORMAL HIGH (ref 36.0–46.0)
Hemoglobin: 16.1 g/dL — ABNORMAL HIGH (ref 12.0–15.0)
MCH: 29 pg (ref 26.0–34.0)
MCHC: 31.7 g/dL (ref 30.0–36.0)
MCV: 91.4 fL (ref 80.0–100.0)
Platelets: 248 10*3/uL (ref 150–400)
RBC: 5.56 MIL/uL — ABNORMAL HIGH (ref 3.87–5.11)
RDW: 13.1 % (ref 11.5–15.5)
WBC: 7 10*3/uL (ref 4.0–10.5)
nRBC: 0 % (ref 0.0–0.2)

## 2020-10-17 LAB — BASIC METABOLIC PANEL
Anion gap: 9 (ref 5–15)
BUN: 14 mg/dL (ref 6–20)
CO2: 29 mmol/L (ref 22–32)
Calcium: 10.1 mg/dL (ref 8.9–10.3)
Chloride: 102 mmol/L (ref 98–111)
Creatinine, Ser: 0.77 mg/dL (ref 0.44–1.00)
GFR, Estimated: >60 mL/min (ref >60)
Glucose, Bld: 93 mg/dL (ref 70–99)
Potassium: 3.8 mmol/L (ref 3.5–5.1)
Sodium: 140 mmol/L (ref 135–145)

## 2020-10-17 SURGERY — SHOULDER ARTHROSCOPY WITH ROTATOR CUFF REPAIR AND SUBACROMIAL DECOMPRESSION
Anesthesia: General | Site: Shoulder | Laterality: Right

## 2020-10-17 MED ORDER — KETOROLAC TROMETHAMINE 10 MG PO TABS: 10.0000 mg | ORAL_TABLET | Freq: Three times a day (TID) | ORAL | 0 refills | Status: DC | PRN

## 2020-10-17 MED ORDER — ONDANSETRON HCL 4 MG/2ML IJ SOLN
INTRAMUSCULAR | Status: DC | PRN
  Administered 2020-10-17: 4 mg via INTRAVENOUS

## 2020-10-17 MED ORDER — POVIDONE-IODINE 7.5 % EX SOLN: Freq: Once | CUTANEOUS | Status: DC

## 2020-10-17 MED ORDER — ROCURONIUM BROMIDE 10 MG/ML (PF) SYRINGE
PREFILLED_SYRINGE | INTRAVENOUS | Status: DC | PRN
  Administered 2020-10-17: 20 mg via INTRAVENOUS
  Administered 2020-10-17: 50 mg via INTRAVENOUS
  Administered 2020-10-17: 10 mg via INTRAVENOUS

## 2020-10-17 MED ORDER — FENTANYL CITRATE (PF) 250 MCG/5ML IJ SOLN
INTRAMUSCULAR | Status: AC
  Filled 2020-10-17: qty 5

## 2020-10-17 MED ORDER — BUPIVACAINE LIPOSOME 1.3 % IJ SUSP
INTRAMUSCULAR | Status: DC | PRN
  Administered 2020-10-17: 10 mL via PERINEURAL

## 2020-10-17 MED ORDER — LIDOCAINE 2% (20 MG/ML) 5 ML SYRINGE
INTRAMUSCULAR | Status: DC | PRN
  Administered 2020-10-17: 20 mg via INTRAVENOUS

## 2020-10-17 MED ORDER — ORAL CARE MOUTH RINSE: 15.0000 mL | Freq: Once | OROMUCOSAL | Status: AC

## 2020-10-17 MED ORDER — BUPIVACAINE HCL (PF) 0.5 % IJ SOLN
INTRAMUSCULAR | Status: DC | PRN
  Administered 2020-10-17: 15 mL via PERINEURAL

## 2020-10-17 MED ORDER — PROPOFOL 10 MG/ML IV BOLUS
INTRAVENOUS | Status: DC | PRN
  Administered 2020-10-17: 100 mg via INTRAVENOUS

## 2020-10-17 MED ORDER — CHLORHEXIDINE GLUCONATE 0.12 % MT SOLN
OROMUCOSAL | Status: AC
  Administered 2020-10-17: 15 mL via OROMUCOSAL
  Filled 2020-10-17: qty 15

## 2020-10-17 MED ORDER — LACTATED RINGERS IV SOLN: INTRAVENOUS | Status: DC

## 2020-10-17 MED ORDER — CHLORHEXIDINE GLUCONATE 0.12 % MT SOLN: 15.0000 mL | Freq: Once | OROMUCOSAL | Status: AC

## 2020-10-17 MED ORDER — PHENYLEPHRINE 40 MCG/ML (10ML) SYRINGE FOR IV PUSH (FOR BLOOD PRESSURE SUPPORT)
PREFILLED_SYRINGE | INTRAVENOUS | Status: AC
  Filled 2020-10-17: qty 10

## 2020-10-17 MED ORDER — DEXAMETHASONE SODIUM PHOSPHATE 10 MG/ML IJ SOLN
INTRAMUSCULAR | Status: AC
  Filled 2020-10-17: qty 1

## 2020-10-17 MED ORDER — SUGAMMADEX SODIUM 200 MG/2ML IV SOLN
INTRAVENOUS | Status: DC | PRN
  Administered 2020-10-17: 200 mg via INTRAVENOUS

## 2020-10-17 MED ORDER — FENTANYL CITRATE (PF) 250 MCG/5ML IJ SOLN
INTRAMUSCULAR | Status: DC | PRN
  Administered 2020-10-17 (×2): 50 ug via INTRAVENOUS

## 2020-10-17 MED ORDER — LACTATED RINGERS IV SOLN: INTRAVENOUS | Status: DC | PRN

## 2020-10-17 MED ORDER — DEXAMETHASONE SODIUM PHOSPHATE 10 MG/ML IJ SOLN
INTRAMUSCULAR | Status: DC | PRN
  Administered 2020-10-17: 10 mg via INTRAVENOUS

## 2020-10-17 MED ORDER — PROPOFOL 10 MG/ML IV BOLUS
INTRAVENOUS | Status: AC
  Filled 2020-10-17: qty 20

## 2020-10-17 MED ORDER — FENTANYL CITRATE (PF) 100 MCG/2ML IJ SOLN
50.0000 ug | Freq: Once | INTRAMUSCULAR | Status: AC
Start: 2020-10-17 — End: 2020-10-17

## 2020-10-17 MED ORDER — MIDAZOLAM HCL 2 MG/2ML IJ SOLN
INTRAMUSCULAR | Status: AC
  Filled 2020-10-17: qty 2

## 2020-10-17 MED ORDER — FENTANYL CITRATE (PF) 100 MCG/2ML IJ SOLN
INTRAMUSCULAR | Status: AC
  Administered 2020-10-17: 50 ug via INTRAVENOUS
  Filled 2020-10-17: qty 2

## 2020-10-17 MED ORDER — POVIDONE-IODINE 10 % EX SWAB: 2.0000 "application " | Freq: Once | CUTANEOUS | Status: DC

## 2020-10-17 MED ORDER — PHENYLEPHRINE 40 MCG/ML (10ML) SYRINGE FOR IV PUSH (FOR BLOOD PRESSURE SUPPORT)
PREFILLED_SYRINGE | INTRAVENOUS | Status: DC | PRN
  Administered 2020-10-17 (×2): 80 ug via INTRAVENOUS

## 2020-10-17 MED ORDER — PHENYLEPHRINE HCL-NACL 10-0.9 MG/250ML-% IV SOLN
INTRAVENOUS | Status: DC | PRN
  Administered 2020-10-17: 20 ug/min via INTRAVENOUS

## 2020-10-17 MED ORDER — PROMETHAZINE HCL 25 MG/ML IJ SOLN: 6.2500 mg | INTRAMUSCULAR | Status: DC | PRN

## 2020-10-17 MED ORDER — FENTANYL CITRATE (PF) 100 MCG/2ML IJ SOLN
25.0000 ug | INTRAMUSCULAR | Status: DC | PRN
Start: 2020-10-17 — End: 2020-10-18

## 2020-10-17 MED ORDER — OXYCODONE-ACETAMINOPHEN 5-325 MG PO TABS: 1.0000 | ORAL_TABLET | ORAL | 0 refills | Status: DC | PRN

## 2020-10-17 MED ORDER — ONDANSETRON HCL 4 MG/2ML IJ SOLN
INTRAMUSCULAR | Status: AC
  Filled 2020-10-17: qty 2

## 2020-10-17 MED ORDER — GABAPENTIN 300 MG PO CAPS: 300.0000 mg | ORAL_CAPSULE | Freq: Three times a day (TID) | ORAL | 0 refills | Status: DC

## 2020-10-17 MED ORDER — MIDAZOLAM HCL 2 MG/2ML IJ SOLN: 2.0000 mg | Freq: Once | INTRAMUSCULAR | Status: AC

## 2020-10-17 MED ORDER — ALBUMIN HUMAN 5 % IV SOLN: INTRAVENOUS | Status: DC | PRN

## 2020-10-17 MED ORDER — EPINEPHRINE PF 1 MG/ML IJ SOLN
INTRAMUSCULAR | Status: DC | PRN
  Administered 2020-10-17: 1 mg

## 2020-10-17 MED ORDER — EPHEDRINE 5 MG/ML INJ
INTRAVENOUS | Status: AC
  Filled 2020-10-17: qty 10

## 2020-10-17 MED ORDER — SODIUM CHLORIDE 0.9 % IR SOLN
Status: DC | PRN
  Administered 2020-10-17: 3000 mL
  Administered 2020-10-17: 1000 mL

## 2020-10-17 MED ORDER — DIPHENHYDRAMINE HCL 50 MG/ML IJ SOLN
INTRAMUSCULAR | Status: AC
  Filled 2020-10-17: qty 1

## 2020-10-17 MED ORDER — CEFAZOLIN SODIUM-DEXTROSE 2-4 GM/100ML-% IV SOLN
2.0000 g | INTRAVENOUS | Status: AC
  Administered 2020-10-17: 2 g via INTRAVENOUS
  Filled 2020-10-17: qty 100

## 2020-10-17 MED ORDER — MIDAZOLAM HCL 2 MG/2ML IJ SOLN
INTRAMUSCULAR | Status: AC
  Administered 2020-10-17: 2 mg via INTRAVENOUS
  Filled 2020-10-17: qty 2

## 2020-10-17 MED ORDER — METHOCARBAMOL 500 MG PO TABS: 500.0000 mg | ORAL_TABLET | Freq: Three times a day (TID) | ORAL | 0 refills | Status: DC | PRN

## 2020-10-17 MED ORDER — MIDAZOLAM HCL 5 MG/5ML IJ SOLN
INTRAMUSCULAR | Status: DC | PRN
  Administered 2020-10-17: 2 mg via INTRAVENOUS

## 2020-10-17 MED ORDER — EPHEDRINE SULFATE-NACL 50-0.9 MG/10ML-% IV SOSY
PREFILLED_SYRINGE | INTRAVENOUS | Status: DC | PRN
  Administered 2020-10-17 (×2): 5 mg via INTRAVENOUS

## 2020-10-17 MED ORDER — DIPHENHYDRAMINE HCL 50 MG/ML IJ SOLN
INTRAMUSCULAR | Status: DC | PRN
  Administered 2020-10-17: 12.5 mg via INTRAVENOUS

## 2020-10-17 SURGICAL SUPPLY — 77 items
AID PSTN UNV HD RSTRNT DISP (MISCELLANEOUS) ×1
ALCOHOL 70% 16 OZ (MISCELLANEOUS) ×2 IMPLANT
ANCH SUT 2 19.1 W/FIBERTAPE (Anchor) ×2 IMPLANT
ANCH SUT FBRTK 1.3 2 TPE (Anchor) ×2 IMPLANT
ANCH SUT SWLK 19.1X6.25 CLS (Anchor) ×1 IMPLANT
ANCHOR FBRTK 2.6 SUTURETAP 1.3 (Anchor) ×4 IMPLANT
ANCHOR SL BIO 4.75 W/FIBERTAPE (Anchor) ×4 IMPLANT
ANCHOR SUT 1.8 FBRTK KNTLS 2SU (Anchor) ×4 IMPLANT
ANCHOR SUT SWIVELLOK BIO (Anchor) ×2 IMPLANT
BLADE CUTTER GATOR 3.5 (BLADE) IMPLANT
BLADE EXCALIBUR 4.0X13 (MISCELLANEOUS) ×2 IMPLANT
BLADE SURG 11 STRL SS (BLADE) IMPLANT
COOLER ICEMAN CLASSIC (MISCELLANEOUS) ×2 IMPLANT
COVER SURGICAL LIGHT HANDLE (MISCELLANEOUS) ×4 IMPLANT
DRAPE INCISE IOBAN 66X45 STRL (DRAPES) ×4 IMPLANT
DRAPE STERI 35X30 U-POUCH (DRAPES) ×2 IMPLANT
DRAPE U-SHAPE 47X51 STRL (DRAPES) ×4 IMPLANT
DRSG TEGADERM 4X4.75 (GAUZE/BANDAGES/DRESSINGS) ×2 IMPLANT
DRSG XEROFORM 1X8 (GAUZE/BANDAGES/DRESSINGS) ×2 IMPLANT
DURAPREP 26ML APPLICATOR (WOUND CARE) ×4 IMPLANT
ELECT REM PT RETURN 9FT ADLT (ELECTROSURGICAL) ×2
ELECTRODE REM PT RTRN 9FT ADLT (ELECTROSURGICAL) ×1 IMPLANT
GAUZE SPONGE 4X4 12PLY STRL (GAUZE/BANDAGES/DRESSINGS) ×2 IMPLANT
GLOVE ECLIPSE 7.0 STRL STRAW (GLOVE) ×6 IMPLANT
GLOVE ECLIPSE 8.0 STRL XLNG CF (GLOVE) ×2 IMPLANT
GLOVE SRG 8 PF TXTR STRL LF DI (GLOVE) ×1 IMPLANT
GLOVE SURG UNDER POLY LF SZ7 (GLOVE) ×2 IMPLANT
GLOVE SURG UNDER POLY LF SZ8 (GLOVE) ×2
GOWN STRL REUS W/ TWL LRG LVL3 (GOWN DISPOSABLE) ×5 IMPLANT
GOWN STRL REUS W/TWL LRG LVL3 (GOWN DISPOSABLE) ×10
KIT BASIN OR (CUSTOM PROCEDURE TRAY) ×2 IMPLANT
KIT STR SPEAR 1.8 FBRTK DISP (KITS) ×2 IMPLANT
KIT TURNOVER KIT B (KITS) ×2 IMPLANT
MANIFOLD NEPTUNE II (INSTRUMENTS) ×2 IMPLANT
NDL SUT 6 .5 CRC .975X.05 MAYO (NEEDLE) ×1 IMPLANT
NEEDLE HYPO 25X1 1.5 SAFETY (NEEDLE) ×2 IMPLANT
NEEDLE MAYO TAPER (NEEDLE) ×2
NEEDLE SCORPION MULTI FIRE (NEEDLE) ×6 IMPLANT
NEEDLE SPNL 18GX3.5 QUINCKE PK (NEEDLE) ×2 IMPLANT
NEEDLE TAPERED W/ NITINOL LOOP (MISCELLANEOUS) ×2 IMPLANT
NS IRRIG 1000ML POUR BTL (IV SOLUTION) ×2 IMPLANT
PACK SHOULDER (CUSTOM PROCEDURE TRAY) ×2 IMPLANT
PAD ARMBOARD 7.5X6 YLW CONV (MISCELLANEOUS) ×4 IMPLANT
PAD COLD SHLDR UNI WRAP-ON (PAD) ×2
PAD COLD UNI WRAP-ON (PAD) ×1 IMPLANT
PORT APPOLLO RF 90DEGREE MULTI (SURGICAL WAND) ×2 IMPLANT
RESTRAINT HEAD UNIVERSAL NS (MISCELLANEOUS) ×2 IMPLANT
SLING ARM IMMOBILIZER LRG (SOFTGOODS) ×2 IMPLANT
SOL PREP POV-IOD 4OZ 10% (MISCELLANEOUS) ×2 IMPLANT
SPONGE LAP 4X18 RFD (DISPOSABLE) ×4 IMPLANT
STRIP CLOSURE SKIN 1/2X4 (GAUZE/BANDAGES/DRESSINGS) ×2 IMPLANT
SUCTION FRAZIER HANDLE 10FR (MISCELLANEOUS) ×2
SUCTION TUBE FRAZIER 10FR DISP (MISCELLANEOUS) ×1 IMPLANT
SUT 2 FIBERLOOP 20 STRT BLUE (SUTURE)
SUT ETHILON 3 0 PS 1 (SUTURE) ×2 IMPLANT
SUT FIBERWIRE #2 38 T-5 BLUE (SUTURE) ×4
SUT FIBERWIRE 2-0 18 17.9 3/8 (SUTURE) ×2
SUT MNCRL AB 3-0 PS2 18 (SUTURE) ×2 IMPLANT
SUT VIC AB 0 CT1 27 (SUTURE) ×4
SUT VIC AB 0 CT1 27XBRD ANBCTR (SUTURE) ×2 IMPLANT
SUT VIC AB 1 CT1 27 (SUTURE) ×4
SUT VIC AB 1 CT1 27XBRD ANBCTR (SUTURE) ×2 IMPLANT
SUT VIC AB 2-0 CT1 27 (SUTURE) ×2
SUT VIC AB 2-0 CT1 TAPERPNT 27 (SUTURE) ×1 IMPLANT
SUT VICRYL 0 UR6 27IN ABS (SUTURE) ×16 IMPLANT
SUTURE 2 FIBERLOOP 20 STRT BLU (SUTURE) IMPLANT
SUTURE FIBERWR #2 38 T-5 BLUE (SUTURE) ×2 IMPLANT
SUTURE FIBERWR 2-0 18 17.9 3/8 (SUTURE) ×1 IMPLANT
SUTURE TAPE 1.3 40 TPR END (SUTURE) ×1 IMPLANT
SUTURETAPE 1.3 40 TPR END (SUTURE) ×2
SYR 20ML LL LF (SYRINGE) ×4 IMPLANT
SYR 30ML LL (SYRINGE) ×2 IMPLANT
SYR TB 1ML LUER SLIP (SYRINGE) ×2 IMPLANT
TOWEL GREEN STERILE (TOWEL DISPOSABLE) ×2 IMPLANT
TOWEL GREEN STERILE FF (TOWEL DISPOSABLE) ×2 IMPLANT
TUBING ARTHROSCOPY IRRIG 16FT (MISCELLANEOUS) ×2 IMPLANT
WATER STERILE IRR 1000ML POUR (IV SOLUTION) ×2 IMPLANT

## 2020-10-17 NOTE — Transfer of Care (Signed)
Immediate Anesthesia Transfer of Care Note  Patient: Sherry Huff  Procedure(s) Performed: RIGHT SHOULDER ARTHROSCOPY, DEBRIDEMENT, BICEPS TENODESIS, ROTATOR CUFF TEAR REPAIR (Right Shoulder)  Patient Location: PACU  Anesthesia Type:GA combined with regional for post-op pain  Level of Consciousness: awake, alert  and oriented  Airway & Oxygen Therapy: Patient connected to nasal cannula oxygen  Post-op Assessment: Report given to RN and Post -op Vital signs reviewed and stable  Post vital signs: Reviewed and stable  Last Vitals:  Vitals Value Taken Time  BP 142/79 10/17/20 1930  Temp    Pulse 78 10/17/20 1935  Resp 18 10/17/20 1935  SpO2 95 % 10/17/20 1935  Vitals shown include unvalidated device data.  Last Pain:  Vitals:   10/17/20 1400  TempSrc:   PainSc: 0-No pain      Patients Stated Pain Goal: 1 (82/42/35 3614)  Complications: No complications documented.

## 2020-10-17 NOTE — Anesthesia Procedure Notes (Signed)
Anesthesia Regional Block: Interscalene brachial plexus block   Pre-Anesthetic Checklist: ,, timeout performed, Correct Patient, Correct Site, Correct Laterality, Correct Procedure, Correct Position, site marked, Risks and benefits discussed,  Surgical consent,  Pre-op evaluation,  At surgeon's request and post-op pain management  Laterality: Right  Prep: chloraprep       Needles:  Injection technique: Single-shot  Needle Type: Echogenic Stimulator Needle     Needle Length: 5cm  Needle Gauge: 22     Additional Needles:   Procedures:,,,, ultrasound used (permanent image in chart),,,,  Narrative:  Start time: 10/17/2020 1:40 PM End time: 10/17/2020 1:50 PM Injection made incrementally with aspirations every 5 mL.  Performed by: Personally  Anesthesiologist: Catalina Gravel, MD  Additional Notes: Functioning IV was confirmed and monitors were applied.  A 26mm 22ga Arrow echogenic stimulator needle was used. Sterile prep and drape, hand hygiene, and sterile gloves were used.  Negative aspiration and negative test dose prior to incremental administration of local anesthetic. The patient tolerated the procedure well.  Ultrasound guidance: relevent anatomy identified, needle position confirmed, local anesthetic spread visualized around nerve(s), vascular puncture avoided.  Image printed for medical record.

## 2020-10-17 NOTE — OR Nursing (Signed)
Patient's spouse, Marcello Moores, provided update by phone; pt tolerating procedure well.

## 2020-10-17 NOTE — Anesthesia Preprocedure Evaluation (Addendum)
Anesthesia Evaluation  Patient identified by MRN, date of birth, ID band Patient awake    Reviewed: Allergy & Precautions, NPO status , Patient's Chart, lab work & pertinent test results, reviewed documented beta blocker date and time   Airway Mallampati: II  TM Distance: >3 FB Neck ROM: Full    Dental  (+) Teeth Intact, Dental Advisory Given, Chipped, Missing,    Pulmonary former smoker,    Pulmonary exam normal breath sounds clear to auscultation       Cardiovascular hypertension, Pt. on medications and Pt. on home beta blockers (-) angina(-) Past MI Normal cardiovascular exam Rhythm:Regular Rate:Normal     Neuro/Psych PSYCHIATRIC DISORDERS Depression negative neurological ROS     GI/Hepatic Neg liver ROS, GERD  ,  Endo/Other  Hyperthyroidism   Renal/GU negative Renal ROS     Musculoskeletal right shoulder rotator cuff tear, biceps subluxation   Abdominal   Peds  Hematology negative hematology ROS (+)   Anesthesia Other Findings Day of surgery medications reviewed with the patient.  Reproductive/Obstetrics                            Anesthesia Physical Anesthesia Plan  ASA: II  Anesthesia Plan: General   Post-op Pain Management:  Regional for Post-op pain   Induction: Intravenous  PONV Risk Score and Plan: 3 and Dexamethasone, Ondansetron and Midazolam  Airway Management Planned: Oral ETT  Additional Equipment:   Intra-op Plan:   Post-operative Plan: Extubation in OR  Informed Consent: I have reviewed the patients History and Physical, chart, labs and discussed the procedure including the risks, benefits and alternatives for the proposed anesthesia with the patient or authorized representative who has indicated his/her understanding and acceptance.     Dental advisory given  Plan Discussed with: CRNA  Anesthesia Plan Comments:         Anesthesia Quick  Evaluation

## 2020-10-17 NOTE — Brief Op Note (Signed)
   10/17/2020  7:11 PM  PATIENT:  Hollace Kinnier  55 y.o. female  PRE-OPERATIVE DIAGNOSIS:  right shoulder rotator cuff tear, biceps subluxation  POST-OPERATIVE DIAGNOSIS:  right shoulder rotator cuff tear, biceps subluxation  PROCEDURE:  Procedure(s): RIGHT SHOULDER ARTHROSCOPY, DEBRIDEMENT, BICEPS TENODESIS, ROTATOR CUFF TEAR REPAIR  SURGEON:  Surgeon(s): Marlou Sa, Tonna Corner, MD  ASSISTANT: magnant pa  ANESTHESIA:   general  EBL: 15 ml    Total I/O In: 200 [I.V.:200] Out: -   BLOOD ADMINISTERED: none  DRAINS: none   LOCAL MEDICATIONS USED:  none  SPECIMEN:  No Specimen  COUNTS:  YES  TOURNIQUET:  * No tourniquets in log *  DICTATION: .Other Dictation: Dictation Number [id number]  PLAN OF CARE: Discharge to home after PACU  PATIENT DISPOSITION:  PACU - hemodynamically stable
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
## 2020-10-17 NOTE — Anesthesia Procedure Notes (Signed)
Procedure Name: Intubation Date/Time: 10/17/2020 4:40 PM Performed by: Jearld Pies, CRNA Pre-anesthesia Checklist: Patient identified, Emergency Drugs available, Suction available and Patient being monitored Patient Re-evaluated:Patient Re-evaluated prior to induction Oxygen Delivery Method: Circle System Utilized Preoxygenation: Pre-oxygenation with 100% oxygen Induction Type: IV induction Ventilation: Mask ventilation without difficulty and Oral airway inserted - appropriate to patient size Laryngoscope Size: Sabra Heck and 2 Grade View: Grade I Tube type: Oral Tube size: 7.0 mm Number of attempts: 1 Airway Equipment and Method: Stylet and Oral airway Placement Confirmation: ETT inserted through vocal cords under direct vision,  positive ETCO2 and breath sounds checked- equal and bilateral Secured at: 23 cm Tube secured with: Tape Dental Injury: Teeth and Oropharynx as per pre-operative assessment

## 2020-10-17 NOTE — Anesthesia Procedure Notes (Signed)
Anesthesia Regional Block: Interscalene brachial plexus block   Pre-Anesthetic Checklist: ,, timeout performed, Correct Patient, Correct Site, Correct Laterality, Correct Procedure, Correct Position, site marked, Risks and benefits discussed,  Surgical consent,  Pre-op evaluation,  At surgeon's request and post-op pain management  Laterality: Right  Prep: chloraprep       Needles:  Injection technique: Single-shot  Needle Type: Echogenic Needle     Needle Length: 9cm  Needle Gauge: 21     Additional Needles:   Procedures:,,,, ultrasound used (permanent image in chart),,,,  Narrative:  Start time: 10/17/2020 1:40 PM End time: 10/17/2020 1:50 PM Injection made incrementally with aspirations every 5 mL.  Performed by: Personally  Anesthesiologist: Catalina Gravel, MD  Additional Notes: No pain on injection. No increased resistance to injection. Injection made in 5cc increments.  Good needle visualization.  Patient tolerated procedure well.

## 2020-10-17 NOTE — Discharge Instructions (Signed)
Shoulder Arthroscopy, Care After The following information offers guidance on how to care for yourself after your procedure. Your health care provider may also give you more specific instructions. If you have problems or questions, contact your health care provider. What can I expect after the procedure? After the procedure, it is common to have:  Pain.  Swelling.  A small amount of fluid from the incision.  Stiffness that improves over time. Follow these instructions at home: If you have a sling or an immobilizer:  Wear it as told by your health care provider. Remove it only as told by your health care provider. These devices protect your shoulder and help it heal by keeping it in place.  Check the skin around it every day. Tell your health care provider about any concerns.  Loosen it if your fingers tingle, become numb, or turn cold and blue.  Keep the sling or immobilizer clean.  If it is not waterproof: ? Do not let it get wet. ? Cover it with a watertight covering when you take a bath or a shower. Incision care  Follow instructions from your health care provider about how to take care of your incisions. Make sure you: ? Wash your hands with soap and water for at least 20 seconds before and after you change your bandage (dressing). If soap and water are not available, use hand sanitizer. ? Change your dressing as told by your health care provider. ? Leave stitches (sutures), skin glue, or adhesive strips in place. These skin closures may need to stay in place for 2 weeks or longer. If adhesive strip edges start to loosen and curl up, you may trim the loose edges. Do not remove adhesive strips completely unless your health care provider tells you to do that.  Check your incision areas every day for signs of infection. Check for: ? Redness. ? More swelling or pain. ? Blood or more fluid. ? Warmth. ? Pus or a bad smell.  Do not take baths, swim, or use a hot tub until your  health care provider approves. Ask your health care provider if you may take showers. You may only be allowed to take sponge baths.   Managing pain, stiffness, and swelling  If directed, put ice on the affected area. To do this: ? If you have a removable sling or immobilizer, remove it as told by your health care provider. ? Put ice in a plastic bag or use the icing device (cold therapy unit) that you were given. Follow instructions from your health care provider about how to use the icing device. ? Place a towel between your skin and the bag or between your skin and the icing device. ? Leave the ice on for 20 minutes, 2-3 times a day. ? Remove the ice if your skin turns bright red. This is very important. If you cannot feel pain, heat, or cold, you have a greater risk of damage to the area.  Move your fingers often to reduce stiffness and swelling.  Raise (elevate) the injured area above the level of your heart while you are lying down. ? It may help to sleep in a sitting position for a few days after your procedure. Try sleeping in a reclining chair or propping yourself up with extra pillows in bed.   Activity  Ask your health care provider what activities are safe for you during recovery.  Do not lift with your affected shoulder until your health care provider approves.  Avoid  pulling and pushing with the arm on your affected side.  If physical therapy was prescribed, do exercises as directed. Doing exercises may help to improve shoulder movement and flexibility (range of motion). Driving  Ask your health care provider when it is safe to drive if you have a sling or immobilizer.  Ask your health care provider if the medicine prescribed to you requires you to avoid driving or using machinery. General instructions  Take over-the-counter and prescription medicines only as told by your health care provider.  Ask your health care provider if the medicine prescribed to you can cause  constipation. You may need to take these actions to prevent or treat constipation: ? Drink enough fluid to keep your urine pale yellow. ? Take over-the-counter or prescription medicines. ? Eat foods that are high in fiber, such as beans, whole grains, and fresh fruits and vegetables. ? Limit foods that are high in fat and processed sugars, such as fried or sweet foods.  Do not use any products that contain nicotine or tobacco. These products include cigarettes, chewing tobacco, and vaping devices, such as e-cigarettes. These can delay incision healing after surgery. If you need help quitting, ask your health care provider.  Keep all follow-up visits. This is important. Contact a health care provider if:  You have a fever or chills.  You have severe pain.  You have any of these signs of infection: ? Redness around an incision. ? More swelling or pain in an incision area. ? Blood or more fluid coming from an incision. ? Warmth coming from an incision. ? Pus or a bad smell coming from an incision.  You notice that an incision has opened up.  You develop a rash. Get help right away if:  You have difficulty breathing.  You have chest pain.  You notice that your fingers tingle, are numb, or are cold and blue even after you loosen your sling or immobilizer.  You develop pain in your lower leg or at the back of your knee. These symptoms may represent a serious problem that is an emergency. Do not wait to see if the symptoms will go away. Get medical help right away. Call your local emergency services (911 in the [place] Do not drive yourself to the hospital. Summary  If you have a sling or an immobilizer, wear it as told by your health care provider.  It may help to sleep in a sitting position for a few days after your procedure.  If physical therapy was prescribed, do exercises as directed. Doing exercises may help to improve shoulder movement and flexibility (range of  motion).  Keep all follow-up visits. This is important. This information is not intended to replace advice given to you by your health care provider. Make sure you discuss any questions you have with your health care provider. Document Revised: 03/06/2020 Document Reviewed: 03/06/2020 Elsevier Patient Education  2021 [place].   How to Use Cold Therapy Cold therapy, or cryotherapy, is a treatment that uses cold temperatures to treat an injury or medical condition. It includes using cold packs or ice packs to reduce pain and swelling. Only use cold therapy if your doctor says it is okay. What are the risks? Generally, cold therapy is a safe treatment. However, it is not safe for:  People who are not able to say they are in pain. These include small children and people who have memory problems.  People who have certain conditions, such as: ? A problem  in the vessels that slows blood flow to the fingers and toes (Raynaud's syndrome). ? Feeling very cold easily (cold hypersensitivity). ? Lack of feeling in the area being iced. Cold therapy may not be safe for people who have other conditions. Do not use it without talking to your doctor if you have:  A heart condition.  High blood pressure.  Open or healing wounds.  An infection.  Pain and swelling in your joints (rheumatoid arthritis).  Poor blood flow in the body.  Diabetes.  Certain skin conditions. How can I make a cold pack? When using a cold pack at home to reduce pain and swelling, you can use:  A silica gel cold pack that has been left in the freezer. You can buy this online or in stores.  A sealable plastic bag that has been filled with crushed ice.  A washcloth or paper towels soaked in cold (or ice) water.  A plastic bag of frozen vegetables. Throw them away when you are finished using them as a cold pack. Supplies needed:  A cold pack.  A towel. This can be dry or damp, based on what you like. How to  use cold therapy 1.  ICEMAN ready and on patient. Needs to be plugged in and ice/water changed as it melts.  1. Place a towel between the cold pack and your skin if needed. 2. Put the cold pack on the affected area. Keep it on for first 24 hours with breaks in between if it is unbearable and circulation is affected. 3. Check your skin after 5 minutes to make sure that there is no damage to the area. Check for: ? White spots on your skin. Your skin may look blotchy or mottled. ? Skin that looks blue or pale. ? Skin that feels waxy or hard. 4. Repeat these steps as many times each day as told by your doctor.    Contact a doctor if:  You start to have white spots on your skin. This may give your skin a blotchy or mottled look.  Your skin turns blue or pale, finger/arm circulation is affected.  Your skin becomes waxy or hard.  Your swelling gets worse. Summary  Cold therapy, or cryotherapy, is used to treat an injury or other conditions. It includes using cold packs or ice packs to reduce pain and swelling.  Cold therapy is not safe for people who are not able to say they are in pain.  When using cold packs or ice packs, always place a towel between the cold source and your skin.  Check your skin after 5 minutes of icing it. This is to make sure that there is no skin damage.  Contact your doctor if you notice changes in your skin or your swelling gets worse. This information is not intended to replace advice given to you by your health care provider. Make sure you discuss any questions you have with your health care provider. Document Revised: 03/23/2020 Document Reviewed: 04/06/2018 Elsevier Patient Education  2021 [place].   Selective Nerve Root Block, Care After This sheet gives you information about how to care for yourself after your procedure. Your health care provider may also give you more specific instructions. If you have problems or questions, contact your health care  provider. What can I expect after the procedure? After the procedure, it is common to feel some discomfort at the injection (puncture) site. The injection may temporarily reduce or eliminate pain in the part of the  body that is supplied by the nerve. Follow these instructions at home: Activity  Do not drive or use heavy machinery while taking prescription pain medicine.  Do not drive for 24 hours if you were given a medicine to help you relax (sedative) during the procedure.  Do not lift anything that is heavier than 10 lb (4.5 kg), or the limit that you are told, until your health care provider says that it is safe.  Return to your normal activities as directed. Ask your health care provider what activities are safe for you. Puncture site care  Remove your bandage (dressing) 24 hours after your procedure, or as told by your health care provider.  Check your puncture site every day for signs of infection. Check for: ? Redness, swelling, or pain. ? Warmth. ? Fluid or blood. ? Pus or a bad smell. General instructions  Take over-the-counter and prescription medicines only as told by your health care provider.  Do not take showers or baths, swim, or use a hot tub until your health care provider approves. In most cases, you may shower and bathe starting 24 hours after your procedure.  If directed, put ice on the puncture area to relieve discomfort. ? Put ice in a plastic bag. ? Place a towel between your skin and the bag. ? Leave the ice on for 20 minutes, 2-3 times a day.  Keep all follow-up visits as told by your health care provider. This is important.   Contact a health care provider if:  You have redness, swelling, or pain around your puncture site.  You have fluid or blood coming from your puncture site.  You have pus or a bad smell coming from your puncture site.  Your puncture site feels warm to the touch.  You have a fever. Get help right away if:  You  have: ? Shortness of breath. ? A severe headache. ? Pain that gets worse.  You develop weakness or loss of feeling (numbness).  You develop muscle weakness or paralysis. Summary  Do not drive for 24 hours if you were given a sedative.  Check your puncture site every day for signs of infection.  Return to your normal activities as directed. Ask your health care provider what activities are safe for you.  Take over-the-counter and prescription medicines only as told by your health care provider. This information is not intended to replace advice given to you by your health care provider. Make sure you discuss any questions you have with your health care provider. Document Revised: 12/22/2019 Document Reviewed: 12/22/2019 Elsevier Patient Education  2021 [place] After This sheet gives you information about how to care for yourself after your procedure. Your health care provider may also give you more specific instructions. If you have problems or questions, contact your health care provider. What can I expect after the procedure? After the procedure, it is common to have:  Tiredness.  Forgetfulness about what happened after the procedure.  Impaired judgment for important decisions.  Nausea or vomiting.  Some difficulty with balance. Follow these instructions at home: For the time period you were told by your health care provider:  Rest as needed.  Do not participate in activities where you could fall or become injured.  Do not drive or use machinery.  Do not drink alcohol.  Do not take sleeping pills or medicines that cause drowsiness.  Do not make important decisions or sign legal documents.  Do not take  care of children on your own.      Eating and drinking  Follow the diet that is recommended by your health care provider.  Drink enough fluid to keep your urine pale yellow.  If you vomit: ? Drink water, juice, or soup  when you can drink without vomiting. ? Make sure you have little or no nausea before eating solid foods. General instructions  Have a responsible adult stay with you for the time you are told. It is important to have someone help care for you until you are awake and alert.  Take over-the-counter and prescription medicines only as told by your health care provider.  If you have sleep apnea, surgery and certain medicines can increase your risk for breathing problems. Follow instructions from your health care provider about wearing your sleep device: ? Anytime you are sleeping, including during daytime naps. ? While taking prescription pain medicines, sleeping medicines, or medicines that make you drowsy.  Avoid smoking.  Keep all follow-up visits as told by your health care provider. This is important. Contact a health care provider if:  You keep feeling nauseous or you keep vomiting.  You feel light-headed.  You are still sleepy or having trouble with balance after 24 hours.  You develop a rash.  You have a fever.  You have redness or swelling around the IV site. Get help right away if:  You have trouble breathing.  You have new-onset confusion at home. Summary  For several hours after your procedure, you may feel tired. You may also be forgetful and have poor judgment.  Have a responsible adult stay with you for the time you are told. It is important to have someone help care for you until you are awake and alert.  Rest as told. Do not drive or operate machinery. Do not drink alcohol or take sleeping pills.  Get help right away if you have trouble breathing, or if you suddenly become confused. This information is not intended to replace advice given to you by your health care provider. Make sure you discuss any questions you have with your health care provider. Document Revised: 03/23/2020 Document Reviewed: 06/10/2019 Elsevier Patient Education  2021 [employer].

## 2020-10-17 NOTE — Progress Notes (Signed)
[facility] Discharge Note  Patient instuctions were given to spouse Marcello Moores. Wound care, diet, pain, follow up care and how and whom to contact with concerns were discussed. Family aware someone needs to remain with patient overnight and concerns after receiving anesthesia and what to avoid and safety. Answered all questions and concerns.   Discussed use of Iceman cooler and ice pack on shoulder. Pt had a block and can feel touch to fingers, able to slightly move finger. No pain noted.   Discharge paperwork has clear contact informations for surgeon and 24 hour RN line for concerns.   Discussed what concerns to look for including infection and signs/symptoms to look for.   IV was removed prior to discharge. Patient was brought to car with belongings.   Pt exits my care.

## 2020-10-18 ENCOUNTER — Telehealth: Payer: Self-pay | Admitting: Orthopedic Surgery

## 2020-10-18 ENCOUNTER — Encounter (HOSPITAL_COMMUNITY): Payer: Self-pay | Admitting: Orthopedic Surgery

## 2020-10-18 DIAGNOSIS — M75121 Complete rotator cuff tear or rupture of right shoulder, not specified as traumatic: Secondary | ICD-10-CM

## 2020-10-18 DIAGNOSIS — S46119A Strain of muscle, fascia and tendon of long head of biceps, unspecified arm, initial encounter: Secondary | ICD-10-CM

## 2020-10-18 DIAGNOSIS — S43431A Superior glenoid labrum lesion of right shoulder, initial encounter: Secondary | ICD-10-CM

## 2020-10-18 DIAGNOSIS — S43003A Unspecified subluxation of unspecified shoulder joint, initial encounter: Secondary | ICD-10-CM

## 2020-10-18 NOTE — Telephone Encounter (Signed)
Patient submitted medical release form, short term disability forms. And $25.00 cash payment to [employer]. Accepted 10/18/20

## 2020-10-18 NOTE — Op Note (Signed)
NAME: ANUPAMA, PIEHL MEDICAL RECORD NO: [id number] ACCOUNT NO: [id number] DATE OF BIRTH: 1965/12/13 FACILITY: [facility] LOCATION: [facility] PHYSICIAN: Yetta Barre. Marlou Sa, MD  Operative Report   DATE OF PROCEDURE: 10/17/2020  PREOPERATIVE DIAGNOSIS:  Right shoulder biceps tendon subluxation and rotator cuff tear, supraspinatus, and subscap.  POSTOPERATIVE DIAGNOSIS:  Right shoulder biceps tendon subluxation and rotator cuff tear, supraspinatus, and subscap.  PROCEDURE:  Right shoulder arthroscopy with debridement of the superior labrum, release of the biceps tendon, mini open rotator cuff tear repair of the supraspinatus.  Subscapularis was not repairable.  SURGEON:  Meredith Pel, MD  ASSISTANT:  Annie Main.  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  DESCRIPTION OF PROCEDURE:  The patient was brought to the operating room where general endotracheal anesthesia was induced.  Preoperative antibiotics were administered.  Timeout was called.  The patient was placed in the beach chair position with the  head in neutral position.  Right shoulder was prescrubbed with alcohol and Betadine, allowed to air dry, prepped with DuraPrep solution, and draped in sterile manner.  Charlie Pitter was used to cover the operative field, particularly around the axilla.  The  posterior portal was created 2 cm medial and inferior to the posterolateral margin of the acromion on the right hand side.  Anterior portal was created under direct visualization.  Diagnostic arthroscopy was performed.  The patient did have intact  glenohumeral articular surfaces except for the superior aspect of the humeral head where there was some wear, grade II, over a 1 x 1 cm area.  Anterior inferior, posterior inferior glenohumeral ligaments were intact.  Biceps tendon was subluxated and  attenuated.   Degenerative SLAP tear was present.  Debridement performed after creating anterior portal under direct visualization.  The superior labrum was debrided with the Arthrocare wand.  Rotator interval was intact.  Infraspinatus was intact.   Supraspinatus tear measured about 2 x 2 cm.  Next, the instruments were removed.  Portals were closed.  Incision was made at the anterolateral margin of the acromion.  Skin and subcutaneous tissue were sharply divided.  Deltoid was split between the  middle and anterior raphae.  A measured distance of 4 cm was marked with a #1 Vicryl suture.  Next, the bursectomy was performed.  CA ligament was preserved due to the possibility of anterior superior escape in this patient.  Next, the biceps tendon was  then tenodesed using two Arthrex knotless 2.8 mm tacks.  This was a very secure fixation under appropriate tension.  The biceps tendon was attenuated in this area, but we were able to get the luggage tag suture around[TIME: 03:01] in the area of more robust  tendon.  Next, this was oversewn with three 0 Vicryl sutures.  At this time, the supraspinatus tear was evaluated and identified.  Mobilization was performed.  The next portion involved placing four 0 Vicryl sutures in the leading edge of the  supraspinatus.  This was performed.  The greater tuberosity was then prepared using a transverse motion with a 15 blade.  Good bleeding bony surface was created.  At this time, with 4 traction sutures in place, we could tell where the supraspinatus went  back down to the tuberosity.  Bone quality in the superior aspect of the humeral head was good, but in the metaphyseal region for the SwiveLocks it was poor.  Next, the two self-punching 2.6  FiberTak suture anchors double loaded with SutureTape were  placed at the greater tuberosity articular surface margin.  They were placed and then 8 SutureTapes from those two sutures spaced equidistant using the Scorpion.  At this time, from  posterior to anterior with the Vicryl suture used to reapproximate the  tendon in the appropriate position, the SutureTapes were tied and the limbs were crossed.  Additional fixation consisting of 2.0 FiberWire between the infraspinatus and supraspinatus were tied and incorporated into the SwiveLocks.  The sutures were then  crossed and placed into a SwiveLock.  Initial SwiveLock posteriorly pulled out because of poor bone quality.  This was a 4.25 SwiveLock.  Next, SwiveLock was 6.25, which did have good purchase.  Anteriorly, a different punch was utilized and the  [employer] had good purchase and good stability.  Overall, a watertight repair was achieved of the supraspinatus, but the subscapularis was retracted to the glenoid rim in accordance with preoperative MRI scanning and was not repairable.  Thorough  irrigation was performed.  Deltoid split was closed using #1 Vicryl suture followed by interrupted inverted 0 Vicryl suture, 2-0 Vicryl suture, and 3-0 Monocryl.  Steri-Strips and impervious dressing was applied.  The patient tolerated the procedure well  without immediate complication.  Luke's assistance was required for retraction, mobilization of tissue, opening, closing.  His assistance was a medical necessity.   ROH D: 10/17/2020 7:19:29 pm T: 10/17/2020 8:24:00 pm  JOB: [id number]/ [id number]

## 2020-10-19 ENCOUNTER — Emergency Department (HOSPITAL_COMMUNITY)
Admission: EM | Admit: 2020-10-19 | Discharge: 2020-10-19 | Disposition: A | Discharge status: Home / Self Care | Payer: BC Managed Care – PPO | Attending: Emergency Medicine | Admitting: Emergency Medicine

## 2020-10-19 ENCOUNTER — Encounter (HOSPITAL_COMMUNITY): Payer: Self-pay

## 2020-10-19 DIAGNOSIS — Z87891 Personal history of nicotine dependence: Secondary | ICD-10-CM | POA: Diagnosis not present

## 2020-10-19 DIAGNOSIS — I10 Essential (primary) hypertension: Secondary | ICD-10-CM | POA: Insufficient documentation

## 2020-10-19 DIAGNOSIS — R29898 Other symptoms and signs involving the musculoskeletal system: Secondary | ICD-10-CM

## 2020-10-19 DIAGNOSIS — Z9889 Other specified postprocedural states: Secondary | ICD-10-CM

## 2020-10-19 DIAGNOSIS — R531 Weakness: Secondary | ICD-10-CM | POA: Insufficient documentation

## 2020-10-19 DIAGNOSIS — Z7982 Long term (current) use of aspirin: Secondary | ICD-10-CM | POA: Insufficient documentation

## 2020-10-19 DIAGNOSIS — R200 Anesthesia of skin: Secondary | ICD-10-CM | POA: Insufficient documentation

## 2020-10-19 DIAGNOSIS — Z79899 Other long term (current) drug therapy: Secondary | ICD-10-CM | POA: Insufficient documentation

## 2020-10-19 NOTE — Discharge Instructions (Addendum)
Continue to follow recommendations as per Dr. Marlou Sa.  If symptoms or not improving in the next 12 to 24 hours, you should contact Dr. Marlou Sa and have him reevaluate you.  Return to the ER if symptoms significantly worsen or change.

## 2020-10-19 NOTE — ED Provider Notes (Signed)
[facility] DEPART[facility] Provider Note   CSN: [id number] Arrival date & time: 10/19/20  0004     History Chief Complaint  Patient presents with  . Post-op Problem    Sherry Huff is a 55 y.o. female.  Patient is a 55 year old female with history of hypertension, hyperlipidemia, GERD.  Patient presents today for evaluation of right arm numbness and weakness.  Patient had a rotator cuff repair by Dr. Marlou Sa yesterday.  She had a nerve block done to her shoulder and was told that strength should return in 24 hours.  It has been a day and a half, and she continues with the numbness.  She denies any swelling.  The history is provided by the patient.       Past Medical History:  Diagnosis Date  . Allergy   . Colon polyp 03/2017   tubular adenoma; repeat colonoscopy in 03/2022  . Depression    in the past was on Prozac not presently   . GERD (gastroesophageal reflux disease)    not on meds  . Hyperlipidemia   . Hypertension   . Tobacco use disorder    quit 07/19/2014    Patient Active Problem List   Diagnosis Date Noted  . Complete tear of right rotator cuff   . Subluxation of tendon of long head of biceps   . Degenerative superior labral anterior-to-posterior (SLAP) tear of right shoulder   . Graves disease 01/26/2019  . Hyperthyroidism 12/01/2018  . Vitamin D deficiency 11/07/2016  . Hypokalemia 11/07/2016  . Obesity, Class II, BMI 35-39.9, with comorbidity ([place]) 09/11/2015  . Essential hypertension, benign 10/19/2012  . Hyperlipidemia 06/18/2011  . Encounter for long-term (current) use of other medications 06/18/2011  . Need for prophylactic vaccination and inoculation against influenza 06/18/2011    Past Surgical History:  Procedure Laterality Date  . ABDOMINAL HYSTERECTOMY  2010   partial (with bladder sling); benign  . BUNIONECTOMY     left  . CESAREAN SECTION     x2  . GANGLION CYST EXCISION     left arm  . INCONTINENCE  SURGERY  2010   Dr. Philis Pique  . SHOULDER ARTHROSCOPY WITH ROTATOR CUFF REPAIR AND SUBACROMIAL DECOMPRESSION Right 10/17/2020   Procedure: RIGHT SHOULDER ARTHROSCOPY, DEBRIDEMENT, BICEPS TENODESIS, ROTATOR CUFF TEAR REPAIR;  Surgeon: Meredith Pel, MD;  Location: [facility];  Service: Orthopedics;  Laterality: Right;     OB History    Gravida  2   Para  2   Term      Preterm      AB      Living  2     SAB      IAB      Ectopic      Multiple      Live Births              Family History  Problem Relation Age of Onset  . Cancer Mother        died of leukemia  . Cancer Father        died of lung cancer  . Hypertension Father   . Depression Sister        bipolar  . Dementia Sister        related to self-inflected gunshot wound to head  . Hypertension Brother   . Hyperlipidemia Brother   . Heart disease Brother        CABG at 19  . Stroke Paternal Aunt   .  Heart disease Paternal Uncle   . COPD Paternal Uncle   . Diabetes Maternal Aunt   . Cancer Paternal Grandmother        breast cancer  . Breast cancer Paternal Grandmother        in her 31's  . Cancer Brother 55       bladder  . Colon polyps Neg Hx   . Colon cancer Neg Hx   . Esophageal cancer Neg Hx   . Rectal cancer Neg Hx   . Stomach cancer Neg Hx     Social History   Tobacco Use  . Smoking status: Former Smoker    Packs/day: 0.50    Years: 10.00    Pack years: 5.00    Types: Cigarettes    Quit date: 07/19/2014    Years since quitting: 6.2  . Smokeless tobacco: Never Used  Vaping Use  . Vaping Use: Never used  Substance Use Topics  . Alcohol use: Yes    Alcohol/week: 0.0 standard drinks    Comment: 1 drink a week on average  . Drug use: No    Home Medications Prior to Admission medications   Medication Sig Start Date End Date Taking? Authorizing Provider  aspirin EC 81 MG tablet Take 81 mg by mouth every evening. Swallow whole.    [provider]  calcium carbonate  (OSCAL) 1500 (600 Ca) MG TABS tablet Take 600 mg of elemental calcium by mouth every evening.    [provider]  diphenhydramine-acetaminophen (TYLENOL PM) 25-500 MG TABS tablet Take 2 tablets by mouth at bedtime as needed (sleep).    [provider]  gabapentin (NEURONTIN) 300 MG capsule Take 1 capsule (300 mg total) by mouth 3 (three) times daily. 10/17/20 10/17/21  Magnant, Charles L, PA-C  hydrochlorothiazide (MICROZIDE) 12.5 MG capsule TAKE 1 CAPSULE BY MOUTH EVERY DAY Patient taking differently: Take 12.5 mg by mouth every evening. 06/21/20   Rita Ohara, MD  ketorolac (TORADOL) 10 MG tablet Take 1 tablet (10 mg total) by mouth every 8 (eight) hours as needed. 10/17/20   Magnant, Charles L, PA-C  methocarbamol (ROBAXIN) 500 MG tablet Take 1 tablet (500 mg total) by mouth every 8 (eight) hours as needed. 10/17/20   Magnant, Charles L, PA-C  metoprolol succinate (TOPROL-XL) 100 MG 24 hr tablet TAKE 1.5 TABLETS BY MOUTH ONCE DAILY (150MG  DOSE). TAKE WITH OR IMMEDIATELY FOLLOWING A MEAL. Patient taking differently: Take 150 mg by mouth every evening. Take 1.5 tablets by mouth once daily (150mg  dose). Take with or immediately following a meal. 08/22/20   Rita Ohara, MD  Multiple Vitamin (MULTIVITAMIN WITH MINERALS) TABS tablet Take 1 tablet by mouth every evening. One-A-Day Multivitamin    [provider]  Omega-3 Fatty Acids (FISH OIL) 1200 MG CAPS Take 2,400 mg by mouth every evening.    [provider]  oxyCODONE-acetaminophen (PERCOCET) 5-325 MG tablet Take 1 tablet by mouth every 4 (four) hours as needed for severe pain. 10/17/20 10/17/21  Magnant, Gerrianne Scale, PA-C  Polyethylene Glycol 400 (BLINK TEARS) 0.25 % SOLN Place 1 drop into both eyes at bedtime.    [provider]  Probiotic Product (PROBIOTIC DAILY PO) Take 1 capsule by mouth daily.    [provider]  rosuvastatin (CRESTOR) 40 MG tablet TAKE 1 TABLET BY MOUTH EVERY DAY Patient taking  differently: Take 40 mg by mouth every evening. 01/25/20   Rita Ohara, MD    Allergies    Codeine, Penicillins, and Strawberry (  diagnostic)  Review of Systems   Review of Systems  All other systems reviewed and are negative.   Physical Exam Updated Vital Signs BP (!) 146/95 (BP Location: Left Arm)   Pulse 78   Temp 98.7 F (37.1 C) (Oral)   Resp 20   SpO2 97%   Physical Exam Vitals and nursing note reviewed.  Constitutional:      General: She is not in acute distress.    Appearance: Normal appearance. She is not ill-appearing, toxic-appearing or diaphoretic.  HENT:     Head: Normocephalic and atraumatic.  Pulmonary:     Effort: Pulmonary effort is normal.  Musculoskeletal:     Comments: The right shoulder incision appears intact.  The dressing is in place, but no drainage or surrounding erythema.  Ulnar and radial pulses are intact and equal to both upper extremities.  Patient is able to flex, extend, and oppose all fingers, however there is some weakness present.  Capillary refill is brisk.  Skin:    General: Skin is warm and dry.  Neurological:     Mental Status: She is alert.     ED Results / Procedures / Treatments   Labs (all labs ordered are listed, but only abnormal results are displayed) Labs Reviewed - No data to display  EKG None  Radiology No results found.  Procedures Procedures   Medications Ordered in ED Medications - No data to display  ED Course  I have reviewed the triage vital signs and the nursing notes.  Pertinent labs & imaging results that were available during my care of the patient were reviewed by me and considered in my medical decision making (see chart for details).    MDM Rules/Calculators/A&P  Patient presents with numbness and weakness of her arm after having a nerve block for her rotator cuff repair.  Patient was told this should wear off in 24 hours, but it has been longer and she is concerned.  She is able to move all of  her fingers and pulses and capillary refill are intact.  I believe that this continues to be related to the nerve block.  It may take more time for this to wear off and resolve.  I have informed her to give it another 24 hours.  If it is not improving she is to speak with her orthopedic surgeon and be evaluated.  Final Clinical Impression(s) / ED Diagnoses Final diagnoses:  None    Rx / DC Orders ED Discharge Orders    None       Veryl Speak, MD 10/19/20 0300

## 2020-10-19 NOTE — ED Triage Notes (Signed)
Pt reports had a rotator cuff surgery  yesterday on right arm and states they gave her a nerve block and states her arm is still numb and swollen . This RN evaluated pt able to feel sensation, able to grip RN fingers, strong bounding pulse and cap refill < 3

## 2020-10-19 NOTE — ED Notes (Signed)
Pt verbalized understanding of d/c instructions, medications and follow up, pt ambulatory to [facility] with steady gait, NAD

## 2020-10-19 NOTE — Anesthesia Postprocedure Evaluation (Signed)
Anesthesia Post Note  Patient: Sherry Huff  Procedure(s) Performed: RIGHT SHOULDER ARTHROSCOPY, DEBRIDEMENT, BICEPS TENODESIS, ROTATOR CUFF TEAR REPAIR (Right Shoulder)     Patient location during evaluation: PACU Anesthesia Type: General Level of consciousness: awake and alert Pain management: pain level controlled Vital Signs Assessment: post-procedure vital signs reviewed and stable Respiratory status: spontaneous breathing, nonlabored ventilation, respiratory function stable and patient connected to nasal cannula oxygen Cardiovascular status: blood pressure returned to baseline and stable Postop Assessment: no apparent nausea or vomiting Anesthetic complications: no   No complications documented.  Last Vitals:  Vitals:   10/17/20 2050 10/17/20 2110  BP:  134/74  Pulse: 78 78  Resp:  16  Temp:  36.5 C  SpO2: 96% 95%    Last Pain:  Vitals:   10/17/20 2110  TempSrc:   PainSc: 0-No pain                 [name] S

## 2020-10-25 ENCOUNTER — Ambulatory Visit (INDEPENDENT_AMBULATORY_CARE_PROVIDER_SITE_OTHER): Payer: BC Managed Care – PPO | Admitting: Orthopedic Surgery

## 2020-10-25 DIAGNOSIS — S46011D Strain of muscle(s) and tendon(s) of the rotator cuff of right shoulder, subsequent encounter: Secondary | ICD-10-CM

## 2020-10-26 MED ORDER — ONDANSETRON HCL 4 MG PO TABS: 4.0000 mg | ORAL_TABLET | Freq: Three times a day (TID) | ORAL | 0 refills | Status: DC | PRN

## 2020-10-26 MED ORDER — HYDROMORPHONE HCL 2 MG PO TABS
1.0000 mg | ORAL_TABLET | Freq: Four times a day (QID) | ORAL | 0 refills | Status: DC | PRN
Start: 2020-10-26 — End: 2020-12-06

## 2020-10-28 ENCOUNTER — Encounter: Payer: Self-pay | Admitting: Orthopedic Surgery

## 2020-10-28 NOTE — Progress Notes (Signed)
Post-Op Visit Note   Patient: Sherry Huff           Date of Birth: 23-Nov-1965           MRN: [id number] Visit Date: 10/25/2020 PCP: Rita Ohara, MD   Assessment & Plan:  Chief Complaint:  Chief Complaint  Patient presents with  . Right Shoulder - Routine Post Op   Visit Diagnoses:  1. Traumatic complete tear of right rotator cuff, subsequent encounter     Plan: Patient is a 55 year old female who presents s/p right shoulder arthroscopy with biceps tenodesis and mini open rotator cuff tear repair.  Patient states that she is doing okay.  She is up to 75 degrees on CPM machine which she is using 3 times per day.  She is in the sling and compliant with not lifting the arm under its own power or any lifting..  Block wore off around Saturday.  Taking gabapentin 3 times per day, muscle relaxer every 8 hours as needed, and oxycodone but this is causing severe nausea.  On exam she has 30 degrees external rotation, 65 degrees abduction, 90 degrees forward flexion.  Axillary nerve intact with deltoid firing.  2+ radial pulse of the right upper extremity.  Prescribe Zofran and Dilaudid for patient's pain control given her severe nausea with oxycodone.  Follow-up in 2 weeks for clinical recheck with likely initiation of PT at that time.  Patient agreed with plan.  Follow-up in 2 weeks.  Follow-Up Instructions: No follow-ups on file.   Orders:  No orders of the defined types were placed in this encounter.  Meds ordered this encounter  Medications  . HYDROmorphone (DILAUDID) 2 MG tablet    Sig: Take 0.5 tablets (1 mg total) by mouth every 6 (six) hours as needed for severe pain.    Dispense:  30 tablet    Refill:  0  . ondansetron (ZOFRAN) 4 MG tablet    Sig: Take 1 tablet (4 mg total) by mouth every 8 (eight) hours as needed for nausea or vomiting.    Dispense:  20 tablet    Refill:  0    Imaging: No results found.  PMFS History: Patient Active Problem List   Diagnosis Date  Noted  . Complete tear of right rotator cuff   . Subluxation of tendon of long head of biceps   . Degenerative superior labral anterior-to-posterior (SLAP) tear of right shoulder   . Graves disease 01/26/2019  . Hyperthyroidism 12/01/2018  . Vitamin D deficiency 11/07/2016  . Hypokalemia 11/07/2016  . Obesity, Class II, BMI 35-39.9, with comorbidity ([place]) 09/11/2015  . Essential hypertension, benign 10/19/2012  . Hyperlipidemia 06/18/2011  . Encounter for long-term (current) use of other medications 06/18/2011  . Need for prophylactic vaccination and inoculation against influenza 06/18/2011   Past Medical History:  Diagnosis Date  . Allergy   . Colon polyp 03/2017   tubular adenoma; repeat colonoscopy in 03/2022  . Depression    in the past was on Prozac not presently   . GERD (gastroesophageal reflux disease)    not on meds  . Hyperlipidemia   . Hypertension   . Tobacco use disorder    quit 07/19/2014    Family History  Problem Relation Age of Onset  . Cancer Mother        died of leukemia  . Cancer Father        died of lung cancer  . Hypertension Father   . Depression Sister  bipolar  . Dementia Sister        related to self-inflected gunshot wound to head  . Hypertension Brother   . Hyperlipidemia Brother   . Heart disease Brother        CABG at 88  . Stroke Paternal Aunt   . Heart disease Paternal Uncle   . COPD Paternal Uncle   . Diabetes Maternal Aunt   . Cancer Paternal Grandmother        breast cancer  . Breast cancer Paternal Grandmother        in her 55's  . Cancer Brother 55       bladder  . Colon polyps Neg Hx   . Colon cancer Neg Hx   . Esophageal cancer Neg Hx   . Rectal cancer Neg Hx   . Stomach cancer Neg Hx     Past Surgical History:  Procedure Laterality Date  . ABDOMINAL HYSTERECTOMY  2010   partial (with bladder sling); benign  . BUNIONECTOMY     left  . CESAREAN SECTION     x2  . GANGLION CYST EXCISION     left arm  .  INCONTINENCE SURGERY  2010   Dr. Philis Pique  . SHOULDER ARTHROSCOPY WITH ROTATOR CUFF REPAIR AND SUBACROMIAL DECOMPRESSION Right 10/17/2020   Procedure: RIGHT SHOULDER ARTHROSCOPY, DEBRIDEMENT, BICEPS TENODESIS, ROTATOR CUFF TEAR REPAIR;  Surgeon: Meredith Pel, MD;  Location: [facility];  Service: Orthopedics;  Laterality: Right;   Social History   Occupational History  . Occupation: Chiropodist: [employer]  Tobacco Use  . Smoking status: Former Smoker    Packs/day: 0.50    Years: 10.00    Pack years: 5.00    Types: Cigarettes    Quit date: 07/19/2014    Years since quitting: 6.2  . Smokeless tobacco: Never Used  Vaping Use  . Vaping Use: Never used  Substance and Sexual Activity  . Alcohol use: Yes    Alcohol/week: 0.0 standard drinks    Comment: 1 drink a week on average  . Drug use: No  . Sexual activity: Yes    Partners: Male    Birth control/protection: Surgical

## 2020-11-08 ENCOUNTER — Ambulatory Visit (INDEPENDENT_AMBULATORY_CARE_PROVIDER_SITE_OTHER): Payer: BC Managed Care – PPO | Admitting: Orthopedic Surgery

## 2020-11-08 ENCOUNTER — Encounter: Payer: Self-pay | Admitting: Orthopedic Surgery

## 2020-11-08 ENCOUNTER — Other Ambulatory Visit: Payer: Self-pay

## 2020-11-08 DIAGNOSIS — S46011D Strain of muscle(s) and tendon(s) of the rotator cuff of right shoulder, subsequent encounter: Secondary | ICD-10-CM

## 2020-11-08 NOTE — Progress Notes (Signed)
Post-Op Visit Note   Patient: Sherry Huff           Date of Birth: October 22, 1965           MRN: [id number] Visit Date: 11/08/2020 PCP: Rita Ohara, MD   Assessment & Plan:  Chief Complaint:  Chief Complaint  Patient presents with  . Right Shoulder - Routine Post Op   Visit Diagnoses:  1. Traumatic complete tear of right rotator cuff, subsequent encounter     Plan: Patient is a 55 year old female who presents s/p right shoulder arthroscopy with rotator cuff tear repair of the supraspinatus on 10/17/2020.  She has retracted subscapularis tear with atrophy that was not repaired at time of surgery due to the retraction.  She has remained in her sling and is progressing well with her CPM machine.  She is up to 90 degrees and using it for about 4 hours/day total.  She takes Tylenol PM at night for pain control and is no longer taking opioid medication regularly.  Her range of motion passively has improved since last visit up to 50 degrees external rotation, 80 degrees abduction, 130 degrees forward flexion.  Incisions are healing well with no evidence of infection or dehiscence.  Axillary nerve is intact with deltoid firing.  2+ radial pulse.  She has been compliant with no lifting and no lifting the arm under 10 power.  Plan to discontinue sling today but recommended she use it when she is out of her house.  Also plan to start outpatient physical therapy upstairs to work on passive range of motion and active assisted range of motion with deltoid isometrics.  She is okay to start rotator cuff strengthening and full active range of motion of the operative shoulder when she is 6 weeks out from procedure which would be 11/28/2020.  Also patient is planning to return to work on 11/19/2020 which will mostly be computer work with no lifting and she will be working from home.  This should be okay.  Follow-up in 4 weeks for clinical recheck.  Follow-Up Instructions: No follow-ups on file.   Orders:   Orders Placed This Encounter  Procedures  . Ambulatory referral to Physical Therapy   No orders of the defined types were placed in this encounter.   Imaging: No results found.  PMFS History: Patient Active Problem List   Diagnosis Date Noted  . Complete tear of right rotator cuff   . Subluxation of tendon of long head of biceps   . Degenerative superior labral anterior-to-posterior (SLAP) tear of right shoulder   . Graves disease 01/26/2019  . Hyperthyroidism 12/01/2018  . Vitamin D deficiency 11/07/2016  . Hypokalemia 11/07/2016  . Obesity, Class II, BMI 35-39.9, with comorbidity ([place]) 09/11/2015  . Essential hypertension, benign 10/19/2012  . Hyperlipidemia 06/18/2011  . Encounter for long-term (current) use of other medications 06/18/2011  . Need for prophylactic vaccination and inoculation against influenza 06/18/2011   Past Medical History:  Diagnosis Date  . Allergy   . Colon polyp 03/2017   tubular adenoma; repeat colonoscopy in 03/2022  . Depression    in the past was on Prozac not presently   . GERD (gastroesophageal reflux disease)    not on meds  . Hyperlipidemia   . Hypertension   . Tobacco use disorder    quit 07/19/2014    Family History  Problem Relation Age of Onset  . Cancer Mother        died of leukemia  .  Cancer Father        died of lung cancer  . Hypertension Father   . Depression Sister        bipolar  . Dementia Sister        related to self-inflected gunshot wound to head  . Hypertension Brother   . Hyperlipidemia Brother   . Heart disease Brother        CABG at 63  . Stroke Paternal Aunt   . Heart disease Paternal Uncle   . COPD Paternal Uncle   . Diabetes Maternal Aunt   . Cancer Paternal Grandmother        breast cancer  . Breast cancer Paternal Grandmother        in her 65's  . Cancer Brother 55       bladder  . Colon polyps Neg Hx   . Colon cancer Neg Hx   . Esophageal cancer Neg Hx   . Rectal cancer Neg Hx   .  Stomach cancer Neg Hx     Past Surgical History:  Procedure Laterality Date  . ABDOMINAL HYSTERECTOMY  2010   partial (with bladder sling); benign  . BUNIONECTOMY     left  . CESAREAN SECTION     x2  . GANGLION CYST EXCISION     left arm  . INCONTINENCE SURGERY  2010   Dr. Philis Pique  . SHOULDER ARTHROSCOPY WITH ROTATOR CUFF REPAIR AND SUBACROMIAL DECOMPRESSION Right 10/17/2020   Procedure: RIGHT SHOULDER ARTHROSCOPY, DEBRIDEMENT, BICEPS TENODESIS, ROTATOR CUFF TEAR REPAIR;  Surgeon: Meredith Pel, MD;  Location: [facility];  Service: Orthopedics;  Laterality: Right;   Social History   Occupational History  . Occupation: Chiropodist: [employer]  Tobacco Use  . Smoking status: Former Smoker    Packs/day: 0.50    Years: 10.00    Pack years: 5.00    Types: Cigarettes    Quit date: 07/19/2014    Years since quitting: 6.3  . Smokeless tobacco: Never Used  Vaping Use  . Vaping Use: Never used  Substance and Sexual Activity  . Alcohol use: Yes    Alcohol/week: 0.0 standard drinks    Comment: 1 drink a week on average  . Drug use: No  . Sexual activity: Yes    Partners: Male    Birth control/protection: Surgical

## 2020-11-10 ENCOUNTER — Ambulatory Visit (INDEPENDENT_AMBULATORY_CARE_PROVIDER_SITE_OTHER): Payer: BC Managed Care – PPO | Admitting: Rehabilitative and Restorative Service Providers"

## 2020-11-10 ENCOUNTER — Other Ambulatory Visit: Payer: Self-pay

## 2020-11-10 ENCOUNTER — Encounter: Payer: Self-pay | Admitting: Rehabilitative and Restorative Service Providers"

## 2020-11-10 DIAGNOSIS — R600 Localized edema: Secondary | ICD-10-CM | POA: Diagnosis not present

## 2020-11-10 DIAGNOSIS — G8929 Other chronic pain: Secondary | ICD-10-CM

## 2020-11-10 DIAGNOSIS — M6281 Muscle weakness (generalized): Secondary | ICD-10-CM

## 2020-11-10 DIAGNOSIS — R293 Abnormal posture: Secondary | ICD-10-CM

## 2020-11-10 DIAGNOSIS — M25511 Pain in right shoulder: Secondary | ICD-10-CM | POA: Diagnosis not present

## 2020-11-10 NOTE — Patient Instructions (Signed)
Access Code: [email or web address] URL: [email or web address] Date: 11/10/2020 Prepared by: Scot Jun  Exercises Supine Shoulder Flexion PROM - 2-3 x daily - 7 x weekly - 1-2 sets - 10 reps - 5 hold Supine Shoulder External Rotation in 45 Degrees Abduction AAROM with [place]el - 2-3 x daily - 7 x weekly - 1-2 sets - 10 reps - 5 hold Seated Scapular Retraction - 2 x daily - 7 x weekly - 1 sets - 10 reps - 5 hold Supine Scapular Retraction - 1 x daily - 7 x weekly - 1 sets - 10 reps - 5 hold Circular Shoulder Pendulum with Table Support - 1-2 x daily - 7 x weekly - 2 sets - 10 reps
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
## 2020-11-14 ENCOUNTER — Encounter: Payer: Self-pay | Admitting: Rehabilitative and Restorative Service Providers"

## 2020-11-14 ENCOUNTER — Ambulatory Visit (INDEPENDENT_AMBULATORY_CARE_PROVIDER_SITE_OTHER): Payer: BC Managed Care – PPO | Admitting: Rehabilitative and Restorative Service Providers"

## 2020-11-14 ENCOUNTER — Other Ambulatory Visit: Payer: Self-pay

## 2020-11-14 DIAGNOSIS — M25511 Pain in right shoulder: Secondary | ICD-10-CM

## 2020-11-14 DIAGNOSIS — R600 Localized edema: Secondary | ICD-10-CM | POA: Diagnosis not present

## 2020-11-14 DIAGNOSIS — R293 Abnormal posture: Secondary | ICD-10-CM

## 2020-11-14 DIAGNOSIS — M6281 Muscle weakness (generalized): Secondary | ICD-10-CM

## 2020-11-14 DIAGNOSIS — G8929 Other chronic pain: Secondary | ICD-10-CM

## 2020-11-14 NOTE — Therapy (Signed)
[facility] Physical Therapy [address] [place], [place], [address] Phone: [phone]   Fax:  [phone]  Physical Therapy Treatment  Patient Details  Name: Sherry Huff MRN: [id number] Date of Birth: [birth year] Referring Provider (PT): Dr. Marlou Sa   Encounter Date: 11/14/2020   PT End of Session - 11/14/20 1321    Visit Number 2    Number of Visits 16    Date for PT Re-Evaluation 01/19/21    Progress Note Due on Visit 10    PT Start Time 1300    PT Stop Time 1326    PT Time Calculation (min) 26 min    Activity Tolerance Patient tolerated treatment well    Behavior During Therapy [facility] for tasks assessed/performed           Past Medical History:  Diagnosis Date  . Allergy   . Colon polyp 03/2017   tubular adenoma; repeat colonoscopy in 03/2022  . Depression    in the past was on Prozac not presently   . GERD (gastroesophageal reflux disease)    not on meds  . Hyperlipidemia   . Hypertension   . Tobacco use disorder    quit 07/19/2014    Past Surgical History:  Procedure Laterality Date  . ABDOMINAL HYSTERECTOMY  2010   partial (with bladder sling); benign  . BUNIONECTOMY     left  . CESAREAN SECTION     x2  . GANGLION CYST EXCISION     left arm  . INCONTINENCE SURGERY  2010   Dr. Philis Pique  . SHOULDER ARTHROSCOPY WITH ROTATOR CUFF REPAIR AND SUBACROMIAL DECOMPRESSION Right 10/17/2020   Procedure: RIGHT SHOULDER ARTHROSCOPY, DEBRIDEMENT, BICEPS TENODESIS, ROTATOR CUFF TEAR REPAIR;  Surgeon: Meredith Pel, MD;  Location: [place];  Service: Orthopedics;  Laterality: Right;    There were no vitals filed for this visit.   Subjective Assessment - 11/14/20 1303    Subjective Pt. stated no pain upon arrival today.  Pt. stated doing exercises and using machine.    Limitations Lifting;House hold activities    Patient Stated Goals Reduce pain    Currently in Pain? No/denies    Pain Score 0-No pain    Pain Onset 1 to 4 weeks ago                              [facility] Adult PT Treatment/Exercise - 11/14/20 0001      Shoulder Exercises: Supine   Other Supine Exercises supine wand flexion 1 lb bar x 10 AAROM, Lt arm assist for flexion x 10, wand ER in 30 degrees abduction x 15    Other Supine Exercises supine scapular retraction 5 sec hold x 10      Manual Therapy   Manual therapy comments PROM within tolerance, g2 inferior jt mobs in flexion, abduction, scaption.                    PT Short Term Goals - 11/14/20 1321      PT SHORT TERM GOAL #1   Title Patient will demonstrate independent use of home exercise program to maintain progress from in clinic treatments.    Time 3    Period Weeks    Status On-going    Target Date 12/01/20             PT Long Term Goals - 11/10/20 0929      PT LONG TERM GOAL #1  Title Patient will demonstrate/report pain at worst less than or equal to 2/10 to facilitate minimal limitation in daily activity secondary to pain symptoms.    Time 10    Period Weeks    Status New    Target Date 01/19/21      PT LONG TERM GOAL #2   Title Patient will demonstrate independent use of home exercise program to facilitate ability to maintain/progress functional gains from skilled physical therapy services.    Time 10    Period Weeks    Status New    Target Date 01/19/21      PT LONG TERM GOAL #3   Title Patient will demonstrate return to work/recreational activity at previous level of function without limitations secondary due to condition (FOTO > = 69)    Time 10    Period Weeks    Status New    Target Date 01/19/21      PT LONG TERM GOAL #4   Title Pt. will demonstrate Rt shoulder AROM WFL s symptoms to facilitate usual reaching, dressing, self care at PLOF s limitation.    Time 10    Period Weeks    Status New    Target Date 01/19/21      PT LONG TERM GOAL #5   Title Pt. will demonstrate Rt arm MMT equal to Lt c dynamometry with 15% of Lt to indicated  ability to lift, carry and perform household activity at PLOF.    Time 10    Period Weeks    Status New    Target Date 01/19/21      Additional Long Term Goals   Additional Long Term Goals Yes                 Plan - 11/14/20 1324    Clinical Impression Statement Overall good replication of existing HEP at this time.  Demonstrated improved tolerance to passive mobility in manual intervention.  Communicated instances of popping in joint c passive ER in HEP.  Upon review in clinic, no instances noted c performance (reviewed technique cues).  Continued progression in AAROM with strengthening/full AROM at 11/28/2020.    Personal Factors and Comorbidities Comorbidity 2    Comorbidities HTN, hyperlipidemia    Examination-Activity Limitations Sleep;Carry;Dressing;Hygiene/Grooming;Lift;Reach Overhead    Examination-Participation Restrictions Occupation;Community Activity;Cleaning;Interpersonal Relationship;Laundry;Meal Prep;Yard Work    Stability/Clinical Decision Making Stable/Uncomplicated    Rehab Potential Good    PT Frequency 2x / week    PT Duration Other (comment)   10 weeks   PT Treatment/Interventions ADLs/Self Care Home Management;Cryotherapy;Electrical Stimulation;Iontophoresis 4mg /ml Dexamethasone;Moist Heat;Traction;Balance training;Therapeutic exercise;Therapeutic activities;Functional mobility training;Stair training;Gait training;DME Instruction;Ultrasound;Neuromuscular re-education;Patient/family education;Passive range of motion;Spinal Manipulations;Joint Manipulations;Dry needling;Vasopneumatic Device;Manual techniques;Taping    PT Next Visit Plan progress passive, active assisted range, deltoid isometrics.  Per MD:  no rotator cuff strengthening, full active range until 11/28/2020.    PT Home Exercise Plan [place] and Agree with Plan of Care Patient           Patient will benefit from skilled therapeutic intervention in order to improve the following  deficits and impairments:  Decreased endurance,Hypomobility,Increased edema,Pain,Impaired UE functional use,Decreased strength,Decreased activity tolerance,Decreased mobility,Increased muscle spasms,Impaired perceived functional ability,Improper body mechanics,Impaired flexibility,Postural dysfunction,Decreased coordination,Decreased range of motion  Visit Diagnosis: Chronic right shoulder pain  Muscle weakness (generalized)  Abnormal posture  Localized edema     Problem List Patient Active Problem List   Diagnosis Date Noted  . Complete tear of right rotator cuff   .  Subluxation of tendon of long head of biceps   . Degenerative superior labral anterior-to-posterior (SLAP) tear of right shoulder   . Graves disease 01/26/2019  . Hyperthyroidism 12/01/2018  . Vitamin D deficiency 11/07/2016  . Hypokalemia 11/07/2016  . Obesity, Class II, BMI 35-39.9, with comorbidity ([place]) 09/11/2015  . Essential hypertension, benign 10/19/2012  . Hyperlipidemia 06/18/2011  . Encounter for long-term (current) use of other medications 06/18/2011  . Need for prophylactic vaccination and inoculation against influenza 06/18/2011    Scot Jun, PT, DPT, OCS, ATC 11/14/20  1:27 PM    [facility] Physical Therapy [address] [place], [place], [address] Phone: [phone]   Fax:  [phone]  Name: Sherry Huff MRN: [id number] Date of Birth: 1966/07/05

## 2020-11-14 NOTE — Patient Instructions (Signed)
Access Code: [email or web address] URL: [email or web address] Date: 11/14/2020 Prepared by: Scot Jun  Exercises Supine Shoulder Flexion PROM - 2-3 x daily - 7 x weekly - 1-2 sets - 10 reps - 5 hold Supine Shoulder External Rotation in 45 Degrees Abduction AAROM with [place] - 2-3 x daily - 7 x weekly - 1-2 sets - 10 reps - 5 hold Seated Scapular Retraction - 2 x daily - 7 x weekly - 1 sets - 10 reps - 5 hold Supine Scapular Retraction - 1 x daily - 7 x weekly - 1 sets - 10 reps - 5 hold Circular Shoulder Pendulum with Table Support - 1-2 x daily - 7 x weekly - 2 sets - 10 reps Isometric Shoulder Flexion at Wall - 2 x daily - 7 x weekly - 1 sets - 15 reps - 5 hold Standing Isometric Shoulder Abduction with Doorway - Arm Bent - 2 x daily - 7 x weekly - 1 sets - 15 reps - 5 hold

## 2020-11-20 ENCOUNTER — Ambulatory Visit (INDEPENDENT_AMBULATORY_CARE_PROVIDER_SITE_OTHER): Payer: BC Managed Care – PPO | Admitting: Physical Therapy

## 2020-11-20 ENCOUNTER — Other Ambulatory Visit: Payer: Self-pay

## 2020-11-20 DIAGNOSIS — R600 Localized edema: Secondary | ICD-10-CM

## 2020-11-20 DIAGNOSIS — M6281 Muscle weakness (generalized): Secondary | ICD-10-CM | POA: Diagnosis not present

## 2020-11-20 DIAGNOSIS — M542 Cervicalgia: Secondary | ICD-10-CM

## 2020-11-20 DIAGNOSIS — G8929 Other chronic pain: Secondary | ICD-10-CM

## 2020-11-20 DIAGNOSIS — R293 Abnormal posture: Secondary | ICD-10-CM

## 2020-11-20 DIAGNOSIS — M25511 Pain in right shoulder: Secondary | ICD-10-CM

## 2020-11-20 NOTE — Therapy (Addendum)
[facility] Physical Therapy [address] [place], [place], [address] Phone: [phone]   Fax:  [phone]  Physical Therapy Treatment  Patient Details  Name: Sherry Huff MRN: [id number] Date of Birth: 16-Apr-1966 Referring Provider (PT): Dr. Marlou Sa   Encounter Date: 11/20/2020   PT End of Session - 11/20/20 1416    Visit Number 3    Number of Visits 16    Date for PT Re-Evaluation 01/19/21    Progress Note Due on Visit 10    PT Start Time 1345    PT Stop Time 1415    PT Time Calculation (min) 30 min    Activity Tolerance Patient tolerated treatment well    Behavior During Therapy [facility] for tasks assessed/performed           Past Medical History:  Diagnosis Date  . Allergy   . Colon polyp 03/2017   tubular adenoma; repeat colonoscopy in 03/2022  . Depression    in the past was on Prozac not presently   . GERD (gastroesophageal reflux disease)    not on meds  . Hyperlipidemia   . Hypertension   . Tobacco use disorder    quit 07/19/2014    Past Surgical History:  Procedure Laterality Date  . ABDOMINAL HYSTERECTOMY  2010   partial (with bladder sling); benign  . BUNIONECTOMY     left  . CESAREAN SECTION     x2  . GANGLION CYST EXCISION     left arm  . INCONTINENCE SURGERY  2010   Dr. Philis Pique  . SHOULDER ARTHROSCOPY WITH ROTATOR CUFF REPAIR AND SUBACROMIAL DECOMPRESSION Right 10/17/2020   Procedure: RIGHT SHOULDER ARTHROSCOPY, DEBRIDEMENT, BICEPS TENODESIS, ROTATOR CUFF TEAR REPAIR;  Surgeon: Meredith Pel, MD;  Location: [place];  Service: Orthopedics;  Laterality: Right;    There were no vitals filed for this visit.   Subjective: Pt stating that she hasn't experienced the "popping" since her last visit.  Pain: mild right shoulder pain reported 2/10.       [facility] PT Assessment - 11/20/20 0001      Assessment   Medical Diagnosis Rt shoulder pain s/p debridement, BT, supraspinatus repair    Referring Provider (PT) Dr. Marlou Sa     Onset Date/Surgical Date 10/17/20    Hand Dominance Right      PROM   Left Shoulder Flexion 140 Degrees   supine   Left Shoulder ABduction 130 Degrees   supine   Left Shoulder Internal Rotation 72 Degrees   supine with shoulder at 30 degrees abduction   Left Shoulder External Rotation 50 Degrees   supine with shoulder at 30 degrees abduction                        [facility] Adult PT Treatment/Exercise - 11/20/20 0001      Shoulder Exercises: Supine   External Rotation AAROM;Right;10 reps;20 reps    External Rotation Limitations shoulder abducted 30 degrees, 2 sets holding 3-5 seconds each    Flexion AAROM;10 reps;Limitations   2 sets   Other Supine Exercises supine scapular retraction 5 sec hold x 10      Shoulder Exercises: Seated   Other Seated Exercises table slides, flexion, scaption and ER all x 10 holding 5 seconds each    Other Seated Exercises towel roll squeezes x 10 holding 5 seconds each, forarm pronation/supination with elbow resting at 90 degrees.      Manual Therapy   Manual therapy comments PROM  as pt tolerated, Grade 2 joint mobs flexion, scaption and abduction                    PT Short Term Goals - 11/14/20 1321      PT SHORT TERM GOAL #1   Title Patient will demonstrate independent use of home exercise program to maintain progress from in clinic treatments.    Time 3    Period Weeks    Status On-going    Target Date 12/01/20             PT Long Term Goals - 11/10/20 0929      PT LONG TERM GOAL #1   Title Patient will demonstrate/report pain at worst less than or equal to 2/10 to facilitate minimal limitation in daily activity secondary to pain symptoms.    Time 10    Period Weeks    Status New    Target Date 01/19/21      PT LONG TERM GOAL #2   Title Patient will demonstrate independent use of home exercise program to facilitate ability to maintain/progress functional gains from skilled physical therapy services.    Time 10     Period Weeks    Status New    Target Date 01/19/21      PT LONG TERM GOAL #3   Title Patient will demonstrate return to work/recreational activity at previous level of function without limitations secondary due to condition (FOTO > = 69)    Time 10    Period Weeks    Status New    Target Date 01/19/21      PT LONG TERM GOAL #4   Title Pt. will demonstrate Rt shoulder AROM WFL s symptoms to facilitate usual reaching, dressing, self care at PLOF s limitation.    Time 10    Period Weeks    Status New    Target Date 01/19/21      PT LONG TERM GOAL #5   Title Pt. will demonstrate Rt arm MMT equal to Lt c dynamometry with 15% of Lt to indicated ability to lift, carry and perform household activity at PLOF.    Time 10    Period Weeks    Status New    Target Date 01/19/21      Additional Long Term Goals   Additional Long Term Goals Yes                 Plan - 11/20/20 1356    Clinical Impression Statement Pt tolerating exercises well today continuing with AAROM unti 11/28/2020. Pt making progress with passive range in flexion, external rotation and abduciton. Pt reporting no more incidents of "popping" since her last visit. Continue skilled PT per protocol.    Personal Factors and Comorbidities Comorbidity 2    Comorbidities HTN, hyperlipidemia    Examination-Activity Limitations Sleep;Carry;Dressing;Hygiene/Grooming;Lift;Reach Overhead    Examination-Participation Restrictions Occupation;Community Activity;Cleaning;Interpersonal Relationship;Laundry;Meal Prep;Yard Work    Stability/Clinical Decision Making Stable/Uncomplicated    Rehab Potential Good    PT Frequency 2x / week    PT Duration Other (comment)    PT Treatment/Interventions ADLs/Self Care Home Management;Cryotherapy;Electrical Stimulation;Iontophoresis 4mg /ml Dexamethasone;Moist Heat;Traction;Balance training;Therapeutic exercise;Therapeutic activities;Functional mobility training;Stair training;Gait training;DME  Instruction;Ultrasound;Neuromuscular re-education;Patient/family education;Passive range of motion;Spinal Manipulations;Joint Manipulations;Dry needling;Vasopneumatic Device;Manual techniques;Taping    PT Next Visit Plan progress passive, active assisted range, deltoid isometrics.  Per MD:  no rotator cuff strengthening, full active range until 11/28/2020.    PT [place]  Consulted and Agree with Plan of Care Patient           Patient will benefit from skilled therapeutic intervention in order to improve the following deficits and impairments:  Decreased endurance,Hypomobility,Increased edema,Pain,Impaired UE functional use,Decreased strength,Decreased activity tolerance,Decreased mobility,Increased muscle spasms,Impaired perceived functional ability,Improper body mechanics,Impaired flexibility,Postural dysfunction,Decreased coordination,Decreased range of motion  Visit Diagnosis: Chronic right shoulder pain  Muscle weakness (generalized)  Abnormal posture  Localized edema  Cervicalgia     Problem List Patient Active Problem List   Diagnosis Date Noted  . Complete tear of right rotator cuff   . Subluxation of tendon of long head of biceps   . Degenerative superior labral anterior-to-posterior (SLAP) tear of right shoulder   . Graves disease 01/26/2019  . Hyperthyroidism 12/01/2018  . Vitamin D deficiency 11/07/2016  . Hypokalemia 11/07/2016  . Obesity, Class II, BMI 35-39.9, with comorbidity ([place]) 09/11/2015  . Essential hypertension, benign 10/19/2012  . Hyperlipidemia 06/18/2011  . Encounter for long-term (current) use of other medications 06/18/2011  . Need for prophylactic vaccination and inoculation against influenza 06/18/2011    Oretha Caprice, PT, MPT 11/20/2020, 2:19 PM  [facility] Physical Therapy [address] [place], [place], [address] Phone: [phone]   Fax:  [phone]  Name: Sherry Huff MRN:  [id number] Date of Birth: 09-24-1965

## 2020-11-22 ENCOUNTER — Encounter: Payer: Self-pay | Admitting: Family Medicine

## 2020-11-23 ENCOUNTER — Encounter: Payer: Self-pay | Admitting: Rehabilitative and Restorative Service Providers"

## 2020-11-23 ENCOUNTER — Ambulatory Visit (INDEPENDENT_AMBULATORY_CARE_PROVIDER_SITE_OTHER): Payer: BC Managed Care – PPO | Admitting: Rehabilitative and Restorative Service Providers"

## 2020-11-23 ENCOUNTER — Other Ambulatory Visit: Payer: Self-pay

## 2020-11-23 DIAGNOSIS — M25511 Pain in right shoulder: Secondary | ICD-10-CM | POA: Diagnosis not present

## 2020-11-23 DIAGNOSIS — R600 Localized edema: Secondary | ICD-10-CM | POA: Diagnosis not present

## 2020-11-23 DIAGNOSIS — R293 Abnormal posture: Secondary | ICD-10-CM

## 2020-11-23 DIAGNOSIS — G8929 Other chronic pain: Secondary | ICD-10-CM

## 2020-11-23 DIAGNOSIS — M6281 Muscle weakness (generalized): Secondary | ICD-10-CM

## 2020-11-23 NOTE — Therapy (Signed)
[facility] Therapy [address] [place], [place], [address] Phone: [phone]   Fax:  [phone]  Physical Therapy Treatment  Patient Details  Name: Sherry Huff MRN: [id number] Date of Birth: [birth year] Referring Provider (PT): Dr. Marlou Sa   Encounter Date: 11/23/2020   PT End of Session - 11/23/20 1341    Visit Number 4    Number of Visits 16    Date for PT Re-Evaluation 01/19/21    Progress Note Due on Visit 10    PT Start Time 1343    PT Stop Time 1422    PT Time Calculation (min) 39 min    Activity Tolerance Patient tolerated treatment well    Behavior During Therapy [facility] for tasks assessed/performed           Past Medical History:  Diagnosis Date  . Allergy   . Colon polyp 03/2017   tubular adenoma; repeat colonoscopy in 03/2022  . Depression    in the past was on Prozac not presently   . GERD (gastroesophageal reflux disease)    not on meds  . Hyperlipidemia   . Hypertension   . Tobacco use disorder    quit 07/19/2014    Past Surgical History:  Procedure Laterality Date  . ABDOMINAL HYSTERECTOMY  2010   partial (with bladder sling); benign  . BUNIONECTOMY     left  . CESAREAN SECTION     x2  . GANGLION CYST EXCISION     left arm  . INCONTINENCE SURGERY  2010   Dr. Philis Pique  . SHOULDER ARTHROSCOPY WITH ROTATOR CUFF REPAIR AND SUBACROMIAL DECOMPRESSION Right 10/17/2020   Procedure: RIGHT SHOULDER ARTHROSCOPY, DEBRIDEMENT, BICEPS TENODESIS, ROTATOR CUFF TEAR REPAIR;  Surgeon: Meredith Pel, MD;  Location: [place];  Service: Orthopedics;  Laterality: Right;    There were no vitals filed for this visit.   Subjective Assessment - 11/23/20 1347    Subjective Pt. stated no pain upon arrival, a little achy yesterday in evening.  Started working this week so feeling tired.    Limitations Lifting;House hold activities    Patient Stated Goals Reduce pain    Currently in Pain? No/denies    Pain Score 0-No pain    Pain  Onset 1 to 4 weeks ago                             [facility] Adult PT Treatment/Exercise - 11/23/20 0001      Shoulder Exercises: Supine   Protraction 10 reps;Both   wand 1 lb  5 sec hold   Flexion AAROM   wand 1 lb 2 x 15     Shoulder Exercises: Seated   Other Seated Exercises table slides, flexion, scaption and ER all x 10 holding 5 seconds each      Shoulder Exercises: Prone   Other Prone Exercises retraction 5 sec hold x 10, retraction c arm extension 5 sec hold x 10      Shoulder Exercises: ROM/Strengthening   UBE (Upper Arm Bike) AAROM Lvl 1.5 3 mins fwd/back each way      Manual Therapy   Manual therapy comments PROM as pt tolerated, Grade 2 joint mobs flexion, scaption and abduction                    PT Short Term Goals - 11/14/20 1321      PT SHORT TERM GOAL #1   Title Patient will demonstrate  independent use of home exercise program to maintain progress from in clinic treatments.    Time 3    Period Weeks    Status On-going    Target Date 12/01/20             PT Long Term Goals - 11/10/20 0929      PT LONG TERM GOAL #1   Title Patient will demonstrate/report pain at worst less than or equal to 2/10 to facilitate minimal limitation in daily activity secondary to pain symptoms.    Time 10    Period Weeks    Status New    Target Date 01/19/21      PT LONG TERM GOAL #2   Title Patient will demonstrate independent use of home exercise program to facilitate ability to maintain/progress functional gains from skilled physical therapy services.    Time 10    Period Weeks    Status New    Target Date 01/19/21      PT LONG TERM GOAL #3   Title Patient will demonstrate return to work/recreational activity at previous level of function without limitations secondary due to condition (FOTO > = 69)    Time 10    Period Weeks    Status New    Target Date 01/19/21      PT LONG TERM GOAL #4   Title Pt. will demonstrate Rt shoulder AROM WFL  s symptoms to facilitate usual reaching, dressing, self care at PLOF s limitation.    Time 10    Period Weeks    Status New    Target Date 01/19/21      PT LONG TERM GOAL #5   Title Pt. will demonstrate Rt arm MMT equal to Lt c dynamometry with 15% of Lt to indicated ability to lift, carry and perform household activity at PLOF.    Time 10    Period Weeks    Status New    Target Date 01/19/21      Additional Long Term Goals   Additional Long Term Goals Yes                 Plan - 11/23/20 1405    Clinical Impression Statement Popping was reported less overall since last visit.  Continued steady progression in tolerance to AAROM/PROM c minimal joint restriction noted at this time.  11/28/2020 strengthening progress coming soon.    Personal Factors and Comorbidities Comorbidity 2    Comorbidities HTN, hyperlipidemia    Examination-Activity Limitations Sleep;Carry;Dressing;Hygiene/Grooming;Lift;Reach Overhead    Examination-Participation Restrictions Occupation;Community Activity;Cleaning;Interpersonal Relationship;Laundry;Meal Prep;Yard Work    Stability/Clinical Decision Making Stable/Uncomplicated    Rehab Potential Good    PT Frequency 2x / week    PT Duration Other (comment)    PT Treatment/Interventions ADLs/Self Care Home Management;Cryotherapy;Electrical Stimulation;Iontophoresis 4mg /ml Dexamethasone;Moist Heat;Traction;Balance training;Therapeutic exercise;Therapeutic activities;Functional mobility training;Stair training;Gait training;DME Instruction;Ultrasound;Neuromuscular re-education;Patient/family education;Passive range of motion;Spinal Manipulations;Joint Manipulations;Dry needling;Vasopneumatic Device;Manual techniques;Taping    PT Next Visit Plan Continue to progress passive, active assisted range, deltoid isometrics.  Per MD:  no rotator cuff strengthening, full active range until 11/28/2020.    PT Home Exercise Plan [place] and Agree with Plan of  Care Patient           Patient will benefit from skilled therapeutic intervention in order to improve the following deficits and impairments:  Decreased endurance,Hypomobility,Increased edema,Pain,Impaired UE functional use,Decreased strength,Decreased activity tolerance,Decreased mobility,Increased muscle spasms,Impaired perceived functional ability,Improper body mechanics,Impaired flexibility,Postural dysfunction,Decreased coordination,Decreased range of motion  Visit Diagnosis: Chronic right shoulder pain  Muscle weakness (generalized)  Abnormal posture  Localized edema     Problem List Patient Active Problem List   Diagnosis Date Noted  . Complete tear of right rotator cuff   . Subluxation of tendon of long head of biceps   . Degenerative superior labral anterior-to-posterior (SLAP) tear of right shoulder   . Graves disease 01/26/2019  . Hyperthyroidism 12/01/2018  . Vitamin D deficiency 11/07/2016  . Hypokalemia 11/07/2016  . Obesity, Class II, BMI 35-39.9, with comorbidity ([place]) 09/11/2015  . Essential hypertension, benign 10/19/2012  . Hyperlipidemia 06/18/2011  . Encounter for long-term (current) use of other medications 06/18/2011  . Need for prophylactic vaccination and inoculation against influenza 06/18/2011   Scot Jun, PT, DPT, OCS, ATC 11/23/20  2:16 PM    [facility] [address] [place], [place], [address] Phone: [phone]   Fax:  [phone]  Name: Sherry Huff MRN: [id number] Date of Birth: Nov 14, 1965

## 2020-11-25 ENCOUNTER — Other Ambulatory Visit: Payer: Self-pay | Admitting: Family Medicine

## 2020-11-25 DIAGNOSIS — E782 Mixed hyperlipidemia: Secondary | ICD-10-CM

## 2020-11-27 ENCOUNTER — Other Ambulatory Visit: Payer: Self-pay

## 2020-11-27 ENCOUNTER — Encounter: Payer: Self-pay | Admitting: Family Medicine

## 2020-11-27 ENCOUNTER — Encounter: Payer: Self-pay | Admitting: Physical Therapy

## 2020-11-27 ENCOUNTER — Ambulatory Visit (INDEPENDENT_AMBULATORY_CARE_PROVIDER_SITE_OTHER): Payer: BC Managed Care – PPO | Admitting: Physical Therapy

## 2020-11-27 DIAGNOSIS — M542 Cervicalgia: Secondary | ICD-10-CM

## 2020-11-27 DIAGNOSIS — R600 Localized edema: Secondary | ICD-10-CM | POA: Diagnosis not present

## 2020-11-27 DIAGNOSIS — M6281 Muscle weakness (generalized): Secondary | ICD-10-CM

## 2020-11-27 DIAGNOSIS — M25511 Pain in right shoulder: Secondary | ICD-10-CM | POA: Diagnosis not present

## 2020-11-27 DIAGNOSIS — R293 Abnormal posture: Secondary | ICD-10-CM | POA: Diagnosis not present

## 2020-11-27 DIAGNOSIS — G8929 Other chronic pain: Secondary | ICD-10-CM

## 2020-11-27 NOTE — Therapy (Signed)
[facility] Physical Therapy [address] [place], [place], [address] Phone: [phone]   Fax:  [phone]  Physical Therapy Treatment  Patient Details  Name: Sherry Huff MRN: [id number] Date of Birth: 04-Feb-1966 Referring Provider (PT): Dr. Marlou Sa   Encounter Date: 11/27/2020   PT End of Session - 11/27/20 1503    Visit Number 5    Number of Visits 16    Date for PT Re-Evaluation 01/19/21    Progress Note Due on Visit 10    PT Start Time 1427    PT Stop Time 1505    PT Time Calculation (min) 38 min    Activity Tolerance Patient tolerated treatment well    Behavior During Therapy [facility] for tasks assessed/performed           Past Medical History:  Diagnosis Date  . Allergy   . Colon polyp 03/2017   tubular adenoma; repeat colonoscopy in 03/2022  . Depression    in the past was on Prozac not presently   . GERD (gastroesophageal reflux disease)    not on meds  . Hyperlipidemia   . Hypertension   . Tobacco use disorder    quit 07/19/2014    Past Surgical History:  Procedure Laterality Date  . ABDOMINAL HYSTERECTOMY  2010   partial (with bladder sling); benign  . BUNIONECTOMY     left  . CESAREAN SECTION     x2  . GANGLION CYST EXCISION     left arm  . INCONTINENCE SURGERY  2010   Dr. Philis Pique  . SHOULDER ARTHROSCOPY WITH ROTATOR CUFF REPAIR AND SUBACROMIAL DECOMPRESSION Right 10/17/2020   Procedure: RIGHT SHOULDER ARTHROSCOPY, DEBRIDEMENT, BICEPS TENODESIS, ROTATOR CUFF TEAR REPAIR;  Surgeon: Meredith Pel, MD;  Location: [place];  Service: Orthopedics;  Laterality: Right;    There were no vitals filed for this visit.   Subjective Assessment - 11/27/20 1435    Subjective Pt stating that she must have slept on it funny last night because she woke with soreness. Pt reporting 2/10 pain in right shoulder.    Limitations Lifting;House hold activities    Patient Stated Goals Reduce pain    Currently in Pain? Yes    Pain Score 2      Pain Location Shoulder    Pain Orientation Right    Pain Descriptors / Indicators Aching    Pain Type Surgical pain    Pain Onset 1 to 4 weeks ago                             [facility] Adult PT Treatment/Exercise - 11/27/20 0001      Shoulder Exercises: Supine   Protraction 10 reps;Both   wand 1 lb  5 sec hold   Flexion AAROM    Flexion Limitations 1# bar, 2x15      Shoulder Exercises: Seated   Other Seated Exercises table slides, flexion, scaption and ER all x 10 holding 5 seconds each    Other Seated Exercises UE ranger: flexion      Shoulder Exercises: Prone   Other Prone Exercises retraction 5 sec hold x 10, retraction c arm extension 5 sec hold x 10      Shoulder Exercises: Pulleys   Flexion 2 minutes    Scaption 2 minutes      Shoulder Exercises: ROM/Strengthening   UBE (Upper Arm Bike) AAROM Lvl 1.5 3 mins fwd/back each way      Manual  Therapy   Manual therapy comments PROM as pt tolerated, Grade 2 joint mobs flexion, scaption and abduction                    PT Short Term Goals - 11/27/20 1442      PT SHORT TERM GOAL #1   Title Patient will demonstrate independent use of home exercise program to maintain progress from in clinic treatments.    Baseline pt reporting compliance with HEP    Status On-going             PT Long Term Goals - 11/27/20 1443      PT LONG TERM GOAL #1   Title Patient will demonstrate/report pain at worst less than or equal to 2/10 to facilitate minimal limitation in daily activity secondary to pain symptoms.    Status On-going      PT LONG TERM GOAL #2   Title Patient will demonstrate independent use of home exercise program to facilitate ability to maintain/progress functional gains from skilled physical therapy services.    Status On-going      PT LONG TERM GOAL #3   Title Patient will demonstrate return to work/recreational activity at previous level of function without limitations secondary due to  condition (FOTO > = 69)    Status New      PT LONG TERM GOAL #4   Title Pt. will demonstrate Rt shoulder AROM WFL s symptoms to facilitate usual reaching, dressing, self care at PLOF s limitation.    Status New      PT LONG TERM GOAL #5   Title Pt. will demonstrate Rt arm MMT equal to Lt c dynamometry with 15% of Lt to indicated ability to lift, carry and perform household activity at PLOF.    Status New                 Plan - 11/27/20 1439    Clinical Impression Statement Pt still reporting popping occassionally with certain movements mosly rotational. Continue progression with AAROM/PROM  until restrictions end on 11/28/2020 when we will begin more strengthening.    Personal Factors and Comorbidities Comorbidity 2    Comorbidities HTN, hyperlipidemia    Examination-Activity Limitations Sleep;Carry;Dressing;Hygiene/Grooming;Lift;Reach Overhead    Examination-Participation Restrictions Occupation;Community Activity;Cleaning;Interpersonal Relationship;Laundry;Meal Prep;Yard Work    Stability/Clinical Decision Making Stable/Uncomplicated    Rehab Potential Good    PT Frequency 2x / week    PT Duration Other (comment)    PT Treatment/Interventions ADLs/Self Care Home Management;Cryotherapy;Electrical Stimulation;Iontophoresis 4mg /ml Dexamethasone;Moist Heat;Traction;Balance training;Therapeutic exercise;Therapeutic activities;Functional mobility training;Stair training;Gait training;DME Instruction;Ultrasound;Neuromuscular re-education;Patient/family education;Passive range of motion;Spinal Manipulations;Joint Manipulations;Dry needling;Vasopneumatic Device;Manual techniques;Taping    PT Next Visit Plan Continue to progress passive, active assisted range, deltoid isometrics.  Per MD:  no rotator cuff strengthening, full active range until 11/28/2020.    PT Home Exercise Plan [place] and Agree with Plan of Care Patient           Patient will benefit from skilled  therapeutic intervention in order to improve the following deficits and impairments:  Decreased endurance,Hypomobility,Increased edema,Pain,Impaired UE functional use,Decreased strength,Decreased activity tolerance,Decreased mobility,Increased muscle spasms,Impaired perceived functional ability,Improper body mechanics,Impaired flexibility,Postural dysfunction,Decreased coordination,Decreased range of motion  Visit Diagnosis: Chronic right shoulder pain  Muscle weakness (generalized)  Abnormal posture  Localized edema  Cervicalgia     Problem List Patient Active Problem List   Diagnosis Date Noted  . Complete tear of right rotator cuff   . Subluxation of tendon  of long head of biceps   . Degenerative superior labral anterior-to-posterior (SLAP) tear of right shoulder   . Graves disease 01/26/2019  . Hyperthyroidism 12/01/2018  . Vitamin D deficiency 11/07/2016  . Hypokalemia 11/07/2016  . Obesity, Class II, BMI 35-39.9, with comorbidity ([place]) 09/11/2015  . Essential hypertension, benign 10/19/2012  . Hyperlipidemia 06/18/2011  . Encounter for long-term (current) use of other medications 06/18/2011  . Need for prophylactic vaccination and inoculation against influenza 06/18/2011    Oretha Caprice, PT, MPT 11/27/2020, 3:06 PM  [facility] Therapy [address] [place], [place], [address] Phone: [phone]   Fax:  [phone]  Name: KEAJA REAUME MRN: [id number] Date of Birth: 01/28/1966

## 2020-11-30 ENCOUNTER — Encounter: Payer: Self-pay | Admitting: Rehabilitative and Restorative Service Providers"

## 2020-11-30 ENCOUNTER — Other Ambulatory Visit: Payer: Self-pay

## 2020-11-30 ENCOUNTER — Ambulatory Visit (INDEPENDENT_AMBULATORY_CARE_PROVIDER_SITE_OTHER): Payer: BC Managed Care – PPO | Admitting: Rehabilitative and Restorative Service Providers"

## 2020-11-30 DIAGNOSIS — R293 Abnormal posture: Secondary | ICD-10-CM | POA: Diagnosis not present

## 2020-11-30 DIAGNOSIS — G8929 Other chronic pain: Secondary | ICD-10-CM

## 2020-11-30 DIAGNOSIS — M25511 Pain in right shoulder: Secondary | ICD-10-CM

## 2020-11-30 DIAGNOSIS — R600 Localized edema: Secondary | ICD-10-CM

## 2020-11-30 DIAGNOSIS — M6281 Muscle weakness (generalized): Secondary | ICD-10-CM

## 2020-11-30 NOTE — Patient Instructions (Signed)
Access Code: [email or web address] URL: [email or web address] Date: 11/30/2020 Prepared by: Scot Jun  Exercises Supine Shoulder Flexion PROM (Mirrored) - 2-3 x daily - 7 x weekly - 1-2 sets - 10 reps - 5 hold Supine Shoulder External Rotation in 45 Degrees Abduction AAROM with [place] (Mirrored) - 2-3 x daily - 7 x weekly - 1-2 sets - 10 reps - 5 hold Seated Scapular Retraction - 2 x daily - 7 x weekly - 1 sets - 10 reps - 5 hold Isometric Shoulder Flexion at Wall (Mirrored) - 2 x daily - 7 x weekly - 1 sets - 15 reps - 5 hold Standing Isometric Shoulder Abduction with Doorway - Arm Bent (Mirrored) - 2 x daily - 7 x weekly - 1 sets - 15 reps - 5 hold Supine Shoulder Flexion Extension Full Range AROM - 2 x daily - 7 x weekly - 3 sets - 10 reps Sidelying Shoulder Abduction Palm Forward - 1 x daily - 7 x weekly - 3 sets - 10 reps Sidelying Shoulder External Rotation (Mirrored) - 2 x daily - 7 x weekly - 3 sets - 10 reps

## 2020-11-30 NOTE — Therapy (Signed)
[facility] [address] [place], [place], [address] Phone: [phone]   Fax:  [phone]  Physical Therapy Treatment  Patient Details  Name: Sherry Huff MRN: [id number] Date of Birth: 06/06/1966 Referring Provider (PT): Dr. Marlou Sa   Encounter Date: 11/30/2020   PT End of Session - 11/30/20 1516    Visit Number 6    Number of Visits 16    Date for PT Re-Evaluation 01/19/21    Progress Note Due on Visit 10    PT Start Time 1512    PT Stop Time 1552    PT Time Calculation (min) 40 min    Activity Tolerance Patient tolerated treatment well    Behavior During Therapy [facility] for tasks assessed/performed           Past Medical History:  Diagnosis Date  . Allergy   . Colon polyp 03/2017   tubular adenoma; repeat colonoscopy in 03/2022  . Depression    in the past was on Prozac not presently   . GERD (gastroesophageal reflux disease)    not on meds  . Hyperlipidemia   . Hypertension   . Tobacco use disorder    quit 07/19/2014    Past Surgical History:  Procedure Laterality Date  . ABDOMINAL HYSTERECTOMY  2010   partial (with bladder sling); benign  . BUNIONECTOMY     left  . CESAREAN SECTION     x2  . GANGLION CYST EXCISION     left arm  . INCONTINENCE SURGERY  2010   Dr. Philis Pique  . SHOULDER ARTHROSCOPY WITH ROTATOR CUFF REPAIR AND SUBACROMIAL DECOMPRESSION Right 10/17/2020   Procedure: RIGHT SHOULDER ARTHROSCOPY, DEBRIDEMENT, BICEPS TENODESIS, ROTATOR CUFF TEAR REPAIR;  Surgeon: Meredith Pel, MD;  Location: [place];  Service: Orthopedics;  Laterality: Right;    There were no vitals filed for this visit.   Subjective Assessment - 11/30/20 1515    Subjective Pt. reported feeling some achy today around 2/10 or so, "just know its there."    Limitations Lifting;House hold activities    Patient Stated Goals Reduce pain    Currently in Pain? Yes    Pain Score 2     Pain Location Shoulder    Pain Orientation Right     Pain Descriptors / Indicators Aching;Sore    Pain Type Surgical pain    Pain Onset 1 to 4 weeks ago    Pain Frequency Intermittent    Aggravating Factors  activity at times creates soreness    Pain Relieving Factors tylenol at night              [facility] PT Assessment - 11/30/20 0001      Assessment   Medical Diagnosis Rt shoulder pain s/p debridement, BT, supraspinatus repair    Referring Provider (PT) Dr. Marlou Sa    Onset Date/Surgical Date 10/17/20    Hand Dominance Right      Precautions   Precaution Comments Pt. can now peform rotator cuff strengthening (after 11/28/2020 per initial referral notes)      AROM   Right Shoulder Flexion 158 Degrees   measured in supine   Right Shoulder ABduction 155 Degrees   measured in supine   Right Shoulder Internal Rotation 70 Degrees   measured in supine, 45 deg abduction   Right Shoulder External Rotation 75 Degrees   measured in supine, 45 deg abduction  [place] Adult PT Treatment/Exercise - 11/30/20 0001      Shoulder Exercises: Supine   Horizontal ABduction AROM   x15   Flexion AROM;Right   to fatigue x 6, x 6, x7   Other Supine Exercises supine circles cw, ccw 2 x 20 in 90 deg flexion      Shoulder Exercises: Sidelying   External Rotation AROM;Right;Other (comment)   2x 15   ABduction AROM;Right   to fatigue x7, x 7     Shoulder Exercises: Standing   Extension 20 reps;Both    Theraband Level (Shoulder Extension) Level 3 (Green)    Row 20 reps;Both;Theraband   scapular retraction hold 3 seconds   Theraband Level (Shoulder Row) Level 3 (Green)      Shoulder Exercises: Pulleys   Flexion 2 minutes    Scaption 2 minutes      Shoulder Exercises: ROM/Strengthening   UBE (Upper Arm Bike) Lvl 2.0 3 mins fwd/back each way      Manual Therapy   Manual therapy comments PROM x myofascial stretching at end range flexion, ER/IR in supine Rt shoulder                  PT Education - 11/30/20  1547    Education Details HEP progression (AROM included now).  Continued reduced sling reliance.    Person(s) Educated Patient    Methods Explanation;Demonstration;Verbal cues;Handout    Comprehension Verbalized understanding;Returned demonstration            PT Short Term Goals - 11/30/20 1549      PT SHORT TERM GOAL #1   Title Patient will demonstrate independent use of home exercise program to maintain progress from in clinic treatments.    Baseline pt reporting compliance with HEP    Status Achieved             PT Long Term Goals - 11/27/20 1443      PT LONG TERM GOAL #1   Title Patient will demonstrate/report pain at worst less than or equal to 2/10 to facilitate minimal limitation in daily activity secondary to pain symptoms.    Status On-going      PT LONG TERM GOAL #2   Title Patient will demonstrate independent use of home exercise program to facilitate ability to maintain/progress functional gains from skilled physical therapy services.    Status On-going      PT LONG TERM GOAL #3   Title Patient will demonstrate return to work/recreational activity at previous level of function without limitations secondary due to condition (FOTO > = 69)    Status New      PT LONG TERM GOAL #4   Title Pt. will demonstrate Rt shoulder AROM WFL s symptoms to facilitate usual reaching, dressing, self care at PLOF s limitation.    Status New      PT LONG TERM GOAL #5   Title Pt. will demonstrate Rt arm MMT equal to Lt c dynamometry with 15% of Lt to indicated ability to lift, carry and perform household activity at PLOF.    Status New                 Plan - 11/30/20 1541    Clinical Impression Statement Initiation of active movement in gravity reduced positioning showed good quantity of movmeent measurement overall but early fatigue noted.  Most difficulty noted in abduction movement c symptoms in Rt upper trap region noted during performance.    Personal Factors and  Comorbidities  Comorbidity 2    Comorbidities HTN, hyperlipidemia    Examination-Activity Limitations Sleep;Carry;Dressing;Hygiene/Grooming;Lift;Reach Overhead    Examination-Participation Restrictions Occupation;Community Activity;Cleaning;Interpersonal Relationship;Laundry;Meal Prep;Yard Work    Stability/Clinical Decision Making Stable/Uncomplicated    Rehab Potential Good    PT Frequency 2x / week    PT Duration Other (comment)    PT Treatment/Interventions ADLs/Self Care Home Management;Cryotherapy;Electrical Stimulation;Iontophoresis 4mg /ml Dexamethasone;Moist Heat;Traction;Balance training;Therapeutic exercise;Therapeutic activities;Functional mobility training;Stair training;Gait training;DME Instruction;Ultrasound;Neuromuscular re-education;Patient/family education;Passive range of motion;Spinal Manipulations;Joint Manipulations;Dry needling;Vasopneumatic Device;Manual techniques;Taping    PT Next Visit Plan Continue to progress passive, active assisted range, deltoid isometrics.  Per MD:  no rotator cuff strengthening, full active range until 11/28/2020.    PT Home Exercise Plan [place] and Agree with Plan of Care Patient           Patient will benefit from skilled therapeutic intervention in order to improve the following deficits and impairments:  Decreased endurance,Hypomobility,Increased edema,Pain,Impaired UE functional use,Decreased strength,Decreased activity tolerance,Decreased mobility,Increased muscle spasms,Impaired perceived functional ability,Improper body mechanics,Impaired flexibility,Postural dysfunction,Decreased coordination,Decreased range of motion  Visit Diagnosis: Chronic right shoulder pain  Muscle weakness (generalized)  Abnormal posture  Localized edema     Problem List Patient Active Problem List   Diagnosis Date Noted  . Complete tear of right rotator cuff   . Subluxation of tendon of long head of biceps   . Degenerative superior  labral anterior-to-posterior (SLAP) tear of right shoulder   . Graves disease 01/26/2019  . Hyperthyroidism 12/01/2018  . Vitamin D deficiency 11/07/2016  . Hypokalemia 11/07/2016  . Obesity, Class II, BMI 35-39.9, with comorbidity ([place]) 09/11/2015  . Essential hypertension, benign 10/19/2012  . Hyperlipidemia 06/18/2011  . Encounter for long-term (current) use of other medications 06/18/2011  . Need for prophylactic vaccination and inoculation against influenza 06/18/2011   Scot Jun, PT, DPT, OCS, ATC 11/30/20  3:50 PM    [facility] [address] [place], [place], [address] Phone: [phone]   Fax:  [phone]  Name: Sherry Huff MRN: [id number] Date of Birth: [birth year]

## 2020-12-04 ENCOUNTER — Encounter: Payer: Self-pay | Admitting: Rehabilitative and Restorative Service Providers"

## 2020-12-04 ENCOUNTER — Ambulatory Visit (INDEPENDENT_AMBULATORY_CARE_PROVIDER_SITE_OTHER): Payer: BC Managed Care – PPO | Admitting: Rehabilitative and Restorative Service Providers"

## 2020-12-04 ENCOUNTER — Other Ambulatory Visit: Payer: Self-pay

## 2020-12-04 DIAGNOSIS — M25511 Pain in right shoulder: Secondary | ICD-10-CM | POA: Diagnosis not present

## 2020-12-04 DIAGNOSIS — R600 Localized edema: Secondary | ICD-10-CM

## 2020-12-04 DIAGNOSIS — G8929 Other chronic pain: Secondary | ICD-10-CM

## 2020-12-04 DIAGNOSIS — M6281 Muscle weakness (generalized): Secondary | ICD-10-CM | POA: Diagnosis not present

## 2020-12-04 DIAGNOSIS — R293 Abnormal posture: Secondary | ICD-10-CM | POA: Diagnosis not present

## 2020-12-04 NOTE — Patient Instructions (Signed)
Access Code: [email or web address] URL: [email or web address] Date: 12/04/2020 Prepared by: Scot Jun  Exercises Supine Shoulder Flexion PROM (Mirrored) - 2-3 x daily - 7 x weekly - 1-2 sets - 10 reps - 5 hold Supine Shoulder External Rotation in 45 Degrees Abduction AAROM with [place] (Mirrored) - 2-3 x daily - 7 x weekly - 1-2 sets - 10 reps - 5 hold Seated Scapular Retraction - 2 x daily - 7 x weekly - 1 sets - 10 reps - 5 hold Isometric Shoulder Flexion at Wall (Mirrored) - 2 x daily - 7 x weekly - 1 sets - 15 reps - 5 hold Standing Isometric Shoulder Abduction with Doorway - Arm Bent (Mirrored) - 2 x daily - 7 x weekly - 1 sets - 15 reps - 5 hold Supine Shoulder Flexion Extension Full Range AROM - 2 x daily - 7 x weekly - 3 sets - 10-15 reps Sidelying Shoulder Abduction Palm Forward - 1 x daily - 7 x weekly - 3 sets - 10-15 reps Sidelying Shoulder External Rotation (Mirrored) - 2 x daily - 7 x weekly - 3 sets - 10-15 reps Supine Shoulder Horizontal Abduction with Resistance - 2 x daily - 7 x weekly - 10 reps - 3 sets

## 2020-12-04 NOTE — Therapy (Signed)
[facility] [address] [place], [place], [address] Phone: [phone]   Fax:  [phone]  Physical Therapy Treatment  Patient Details  Name: Sherry Huff MRN: [id number] Date of Birth: 1965/10/30 Referring Provider (PT): Dr. Marlou Sa   Encounter Date: 12/04/2020   PT End of Session - 12/04/20 1518    Visit Number 7    Number of Visits 16    Date for PT Re-Evaluation 01/19/21    Progress Note Due on Visit 10    PT Start Time 9323    PT Stop Time 1554    PT Time Calculation (min) 39 min    Activity Tolerance Patient tolerated treatment well    Behavior During Therapy [facility] for tasks assessed/performed           Past Medical History:  Diagnosis Date  . Allergy   . Colon polyp 03/2017   tubular adenoma; repeat colonoscopy in 03/2022  . Depression    in the past was on Prozac not presently   . GERD (gastroesophageal reflux disease)    not on meds  . Hyperlipidemia   . Hypertension   . Tobacco use disorder    quit 07/19/2014    Past Surgical History:  Procedure Laterality Date  . ABDOMINAL HYSTERECTOMY  2010   partial (with bladder sling); benign  . BUNIONECTOMY     left  . CESAREAN SECTION     x2  . GANGLION CYST EXCISION     left arm  . INCONTINENCE SURGERY  2010   Dr. Philis Pique  . SHOULDER ARTHROSCOPY WITH ROTATOR CUFF REPAIR AND SUBACROMIAL DECOMPRESSION Right 10/17/2020   Procedure: RIGHT SHOULDER ARTHROSCOPY, DEBRIDEMENT, BICEPS TENODESIS, ROTATOR CUFF TEAR REPAIR;  Surgeon: Meredith Pel, MD;  Location: [place];  Service: Orthopedics;  Laterality: Right;    There were no vitals filed for this visit.   Subjective Assessment - 12/04/20 1517    Subjective Pt. reported soreness around 3/10 or so today.  Pt. stated it was sore off and on, noted c exercise yesterday.    Limitations Lifting;House hold activities    Patient Stated Goals Reduce pain    Currently in Pain? Yes    Pain Score 3     Pain Location Shoulder     Pain Orientation Right    Pain Descriptors / Indicators Sore    Pain Type Surgical pain    Pain Onset 1 to 4 weeks ago    Pain Frequency Intermittent    Aggravating Factors  exercise yesterday    Pain Relieving Factors rest, medicine at night before bed.                             [place] Adult PT Treatment/Exercise - 12/04/20 0001      Neuro Re-ed    Neuro Re-ed Details  rhythmic stabiliztions in 100 deg flexion c mild resistance      Shoulder Exercises: Supine   Horizontal ABduction Strengthening;Both   2 x 10   Theraband Level (Shoulder Horizontal ABduction) Level 3 (Green)    Flexion Right   to fatigue x14 , x20   Other Supine Exercises supine passive flexion c Lt arm assist x 10      Shoulder Exercises: Sidelying   External Rotation Right   1 lb 2 x 10   ABduction Right   2 x 10     Shoulder Exercises: Standing   Extension Both   3 x  10   Theraband Level (Shoulder Extension) Level 3 (Green)    Row Both;Theraband   3 x 10   Theraband Level (Shoulder Row) Level 3 (Green)      Shoulder Exercises: ROM/Strengthening   UBE (Upper Arm Bike) Lvl 2.5 4 mins fwd, 3 mins backward                  PT Education - 12/04/20 1529    Education Details Additional HEP    Person(s) Educated Patient    Methods Explanation;Handout;Verbal cues;Demonstration    Comprehension Verbalized understanding;Returned demonstration            PT Short Term Goals - 11/30/20 1549      PT SHORT TERM GOAL #1   Title Patient will demonstrate independent use of home exercise program to maintain progress from in clinic treatments.    Baseline pt reporting compliance with HEP    Status Achieved             PT Long Term Goals - 11/27/20 1443      PT LONG TERM GOAL #1   Title Patient will demonstrate/report pain at worst less than or equal to 2/10 to facilitate minimal limitation in daily activity secondary to pain symptoms.    Status On-going      PT LONG TERM  GOAL #2   Title Patient will demonstrate independent use of home exercise program to facilitate ability to maintain/progress functional gains from skilled physical therapy services.    Status On-going      PT LONG TERM GOAL #3   Title Patient will demonstrate return to work/recreational activity at previous level of function without limitations secondary due to condition (FOTO > = 69)    Status New      PT LONG TERM GOAL #4   Title Pt. will demonstrate Rt shoulder AROM WFL s symptoms to facilitate usual reaching, dressing, self care at PLOF s limitation.    Status New      PT LONG TERM GOAL #5   Title Pt. will demonstrate Rt arm MMT equal to Lt c dynamometry with 15% of Lt to indicated ability to lift, carry and perform household activity at PLOF.    Status New                 Plan - 12/04/20 1539    Clinical Impression Statement Pt. demonstrated improvement quality in active movement activity but still reported early fatigue in intervention, though improving.  Overall mobility gaining very well.    Personal Factors and Comorbidities Comorbidity 2    Comorbidities HTN, hyperlipidemia    Examination-Activity Limitations Sleep;Carry;Dressing;Hygiene/Grooming;Lift;Reach Overhead    Examination-Participation Restrictions Occupation;Community Activity;Cleaning;Interpersonal Relationship;Laundry;Meal Prep;Yard Work    Stability/Clinical Decision Making Stable/Uncomplicated    Rehab Potential Good    PT Frequency 2x / week    PT Duration Other (comment)    PT Treatment/Interventions ADLs/Self Care Home Management;Cryotherapy;Electrical Stimulation;Iontophoresis 4mg /ml Dexamethasone;Moist Heat;Traction;Balance training;Therapeutic exercise;Therapeutic activities;Functional mobility training;Stair training;Gait training;DME Instruction;Ultrasound;Neuromuscular re-education;Patient/family education;Passive range of motion;Spinal Manipulations;Joint Manipulations;Dry needling;Vasopneumatic  Device;Manual techniques;Taping    PT Next Visit Plan Continue gravity reduced active movement/strengthening c easy progression into resistance.    PT Home Exercise Plan [place] and Agree with Plan of Care Patient           Patient will benefit from skilled therapeutic intervention in order to improve the following deficits and impairments:  Decreased endurance,Hypomobility,Increased edema,Pain,Impaired UE functional use,Decreased strength,Decreased activity tolerance,Decreased mobility,Increased muscle spasms,Impaired perceived  functional ability,Improper body mechanics,Impaired flexibility,Postural dysfunction,Decreased coordination,Decreased range of motion  Visit Diagnosis: Chronic right shoulder pain  Muscle weakness (generalized)  Abnormal posture  Localized edema     Problem List Patient Active Problem List   Diagnosis Date Noted  . Complete tear of right rotator cuff   . Subluxation of tendon of long head of biceps   . Degenerative superior labral anterior-to-posterior (SLAP) tear of right shoulder   . Graves disease 01/26/2019  . Hyperthyroidism 12/01/2018  . Vitamin D deficiency 11/07/2016  . Hypokalemia 11/07/2016  . Obesity, Class II, BMI 35-39.9, with comorbidity ([place]) 09/11/2015  . Essential hypertension, benign 10/19/2012  . Hyperlipidemia 06/18/2011  . Encounter for long-term (current) use of other medications 06/18/2011  . Need for prophylactic vaccination and inoculation against influenza 06/18/2011   Scot Jun, PT, DPT, OCS, ATC 12/04/20  3:53 PM    [facility] [address] [place], [place], [address] Phone: [phone]   Fax:  [phone]  Name: MARIELOUISE AMEY MRN: [id number] Date of Birth: [birth year]

## 2020-12-05 NOTE — Progress Notes (Signed)
Chief Complaint  Patient presents with  . Hypothyroidism    Nonfasting med check. Right knee yesterday was swollen and painful-took 3 ibu early evening and tylenol and is better but did want to mention. Does not want any vaccines due to PT on other arm.    Patient presents for 6 month follow-up.  Since her last visit she had R shoulder surgery (in March), and has been getting PT.  Hypertension: BP medication was changed from benazepril to metoprolol when she was diagnosed with hyperthyroidism.  She is taking 150 mg of metoprolol, in addition to HCTZ.  She denies side effects. No headaches, dizziness or chest pain. BP's are not monitored elsewhere (just on her watch, which are all good). BP Readings from Last 3 Encounters:  10/19/20 (!) 146/95  10/17/20 134/74  08/03/20 132/80    Graves' disease.  She no longer sees endocrinologist.  She stopped methimazole in 04/2020 and her TSH at her physical was normal (06/2020). She is due for recheck. She denies any change in symptoms--she has some mild constipation, otherwise no symptoms related to thyroid--no change in temperature intolerance, changes in hair/skin/nails/moods/energy.   She has gained weight. Since her surgery she is sitting around more, so has been "nibbling" more. She was eating more fast food, was eating late.  Has been drinking more tea with honey (due to some trouble sleeping related to meds).  She has been less active since surgery.  Nalis are a little more brittle, feels a little colder lately.  No changes in hair. She has decrease in energy.  +sweet tea or mixed drink once or twice a week.  Mainly drinks water at home.  Lab Results  Component Value Date   TSH 1.320 06/21/2020   Hyperlipidemia follow-up: Patient generally follows a low-fat, low cholesterol diet (though worse diet recently, as reported above). Denies side effects from theCrestor. Also takes omega-3 fishoil 2 capsules daily. Her triglycerides were mildly  elevated on last check. Lab Results  Component Value Date   CHOL 153 06/21/2020   HDL 50 06/21/2020   LDLCALC 75 06/21/2020   TRIG 166 (H) 06/21/2020   CHOLHDL 3.1 06/21/2020    PMH, PSH, SH reviewed  Outpatient Encounter Medications as of 12/06/2020  Medication Sig Note  . aspirin EC 81 MG tablet Take 81 mg by mouth every evening. Swallow whole.   . calcium carbonate (OSCAL) 1500 (600 Ca) MG TABS tablet Take 600 mg of elemental calcium by mouth every evening.   . diphenhydramine-acetaminophen (TYLENOL PM) 25-500 MG TABS tablet Take 2 tablets by mouth at bedtime as needed (sleep).   . hydrochlorothiazide (MICROZIDE) 12.5 MG capsule TAKE 1 CAPSULE BY MOUTH EVERY DAY (Patient taking differently: Take 12.5 mg by mouth every evening.)   . ibuprofen (ADVIL) 200 MG tablet Take 400 mg by mouth every 6 (six) hours as needed. 12/06/2020: Last dose 11:00am today  . metoprolol succinate (TOPROL-XL) 100 MG 24 hr tablet TAKE 1.5 TABLETS BY MOUTH ONCE DAILY (150MG  DOSE). TAKE WITH OR IMMEDIATELY FOLLOWING A MEAL.   . Multiple Vitamin (MULTIVITAMIN WITH MINERALS) TABS tablet Take 1 tablet by mouth every evening. One-A-Day Multivitamin   . Omega-3 Fatty Acids (FISH OIL) 1200 MG CAPS Take 2,400 mg by mouth every evening.   . Polyethylene Glycol 400 (BLINK TEARS) 0.25 % SOLN Place 1 drop into both eyes at bedtime.   . Probiotic Product (PROBIOTIC DAILY PO) Take 1 capsule by mouth daily.   . rosuvastatin (CRESTOR) 40 MG tablet TAKE  1 TABLET BY MOUTH EVERY DAY (Patient taking differently: Take 40 mg by mouth every evening.)   . [DISCONTINUED] gabapentin (NEURONTIN) 300 MG capsule Take 1 capsule (300 mg total) by mouth 3 (three) times daily.   . [DISCONTINUED] HYDROmorphone (DILAUDID) 2 MG tablet Take 0.5 tablets (1 mg total) by mouth every 6 (six) hours as needed for severe pain.   . [DISCONTINUED] ketorolac (TORADOL) 10 MG tablet Take 1 tablet (10 mg total) by mouth every 8 (eight) hours as needed.   .  [DISCONTINUED] methocarbamol (ROBAXIN) 500 MG tablet Take 1 tablet (500 mg total) by mouth every 8 (eight) hours as needed.   . [DISCONTINUED] ondansetron (ZOFRAN) 4 MG tablet Take 1 tablet (4 mg total) by mouth every 8 (eight) hours as needed for nausea or vomiting.    Facility-Administered Encounter Medications as of 12/06/2020  Medication  . 0.9 %  sodium chloride infusion   Allergies  Allergen Reactions  . Codeine Nausea And Vomiting  . Penicillins Nausea And Vomiting  . Strawberry (Diagnostic) Rash   ROS: no fever, chills, URI symptoms, headaches, dizziness, chest pain, palpitations, shortness of breath.  No GI complaints, bleeding, bruising, rash or other concerns. Occasional mild nausea in the mornings. +15# weight gain   PHYSICAL EXAM:  BP 130/80   Pulse 72   Ht 5\' 4"  (1.626 m)   Wt 187 lb 6.4 oz (85 kg)   BMI 32.17 kg/m   Wt Readings from Last 3 Encounters:  12/06/20 187 lb 6.4 oz (85 kg)  10/17/20 172 lb (78 kg)  08/03/20 169 lb 12.8 oz (77 kg)   Well developed, pleasant female in no distress HEENT: Conjunctiva and sclera are clear, EOMI. Wearing mask. Neck: no lymphadenopathy, thyromegaly or mass Heart: regular rate and rhythm, no murmur Lungs: clear bilaterally Abdomen: soft, nontender, no organomegaly or mass Back: no spinal or CVA tenderness Extremities: no edema Psych: normal mood, affect Neuro: alert and oriented, DTR's normal, symmetric.  Normal gait   ASSESSMENT/PLAN:  Graves disease - Off medication and monitoring for development of hypothyroidism. She has had weight gain, but significant change in diet/activity - Plan: TSH  Essential hypertension, benign - well controlled  Mixed hyperlipidemia - TG mildly elevated on last check, suspect would be worse now. Reviewed low fat diet. Cont statin, fish oil  BMI 32.0-32.9,adult - counseled re: diet, exercise weight loss    COVID booster and Shingrix recommended--will schedule to get once her arm is  feeling better.  [facility] if needed for thyroid medication. Discussed brand vs generic, okay to start with brand.  F/u as scheduled for CPE, sooner if thyroid meds started, or if she develops symptoms between now and physical

## 2020-12-06 ENCOUNTER — Encounter: Payer: Self-pay | Admitting: Orthopedic Surgery

## 2020-12-06 ENCOUNTER — Encounter: Payer: Self-pay | Admitting: Family Medicine

## 2020-12-06 ENCOUNTER — Other Ambulatory Visit: Payer: Self-pay

## 2020-12-06 ENCOUNTER — Ambulatory Visit (INDEPENDENT_AMBULATORY_CARE_PROVIDER_SITE_OTHER): Payer: BC Managed Care – PPO | Admitting: Orthopedic Surgery

## 2020-12-06 ENCOUNTER — Ambulatory Visit (INDEPENDENT_AMBULATORY_CARE_PROVIDER_SITE_OTHER): Payer: BC Managed Care – PPO | Admitting: Family Medicine

## 2020-12-06 VITALS — BP 130/80 | HR 72 | Ht 64.0 in | Wt 187.4 lb

## 2020-12-06 DIAGNOSIS — E0500 Thyrotoxicosis with diffuse goiter without thyrotoxic crisis or storm: Secondary | ICD-10-CM | POA: Diagnosis not present

## 2020-12-06 DIAGNOSIS — Z6832 Body mass index (BMI) 32.0-32.9, adult: Secondary | ICD-10-CM | POA: Diagnosis not present

## 2020-12-06 DIAGNOSIS — E782 Mixed hyperlipidemia: Secondary | ICD-10-CM | POA: Diagnosis not present

## 2020-12-06 DIAGNOSIS — S46011D Strain of muscle(s) and tendon(s) of the rotator cuff of right shoulder, subsequent encounter: Secondary | ICD-10-CM

## 2020-12-06 DIAGNOSIS — I10 Essential (primary) hypertension: Secondary | ICD-10-CM

## 2020-12-06 NOTE — Patient Instructions (Signed)
Please get a COVID booster soon. I also recommend shingles vaccine, to start at least 2 weeks after you get your COVID booster.  This is a series of 2 shots given 2 months apart.

## 2020-12-06 NOTE — Progress Notes (Signed)
Post-Op Visit Note   Patient: Sherry Huff           Date of Birth: 1966/05/19           MRN: [id number] Visit Date: 12/06/2020 PCP: Rita Ohara, MD   Assessment & Plan:  Chief Complaint:  Chief Complaint  Patient presents with  . Right Shoulder - Routine Post Op   Visit Diagnoses:  1. Traumatic complete tear of right rotator cuff, subsequent encounter     Plan: 55 year old patient is 6 weeks out right shoulder arthroscopy with debridement biceps tenodesis and rotator cuff tear repair of the supraspinatus.  Subscap not repairable.  She is doing well.  She has been in a sling.  On exam she is got good passive range of motion of the External rotation strength.  Abduction strength is 5-5.  Subscap strength and is not as expected.  A visit plan is DC sling but no lifting 82 pounds on the right arm.  Take Tylenol every night.  Continue therapy 2 times a week.  4-week return for final check.  She has a small amount of popping with passive range of motion but it feels more like taking 10 mg every 4 hours since bursitis and not a peeled away rotator cuff.   Follow-Up Instructions: Return in about 4 weeks (around 01/03/2021).   Orders:  No orders of the defined types were placed in this encounter.  No orders of the defined types were placed in this encounter.   Imaging: No results found.  PMFS History: Patient Active Problem List   Diagnosis Date Noted  . Complete tear of right rotator cuff   . Subluxation of tendon of long head of biceps   . Degenerative superior labral anterior-to-posterior (SLAP) tear of right shoulder   . Graves disease 01/26/2019  . Hyperthyroidism 12/01/2018  . Vitamin D deficiency 11/07/2016  . Hypokalemia 11/07/2016  . Obesity, Class II, BMI 35-39.9, with comorbidity ([place]) 09/11/2015  . Essential hypertension, benign 10/19/2012  . Hyperlipidemia 06/18/2011  . Encounter for long-term (current) use of other medications 06/18/2011  . Need for  prophylactic vaccination and inoculation against influenza 06/18/2011   Past Medical History:  Diagnosis Date  . Allergy   . Colon polyp 03/2017   tubular adenoma; repeat colonoscopy in 03/2022  . Depression    in the past was on Prozac not presently   . GERD (gastroesophageal reflux disease)    not on meds  . Hyperlipidemia   . Hypertension   . Tobacco use disorder    quit 07/19/2014    Family History  Problem Relation Age of Onset  . Cancer Mother        died of leukemia  . Cancer Father        died of lung cancer  . Hypertension Father   . Depression Sister        bipolar  . Dementia Sister        related to self-inflected gunshot wound to head  . Hypertension Brother   . Hyperlipidemia Brother   . Heart disease Brother        CABG at 60  . Stroke Paternal Aunt   . Heart disease Paternal Uncle   . COPD Paternal Uncle   . Diabetes Maternal Aunt   . Cancer Paternal Grandmother        breast cancer  . Breast cancer Paternal Grandmother        in her 84's  . Cancer Brother 55  bladder  . Colon polyps Neg Hx   . Colon cancer Neg Hx   . Esophageal cancer Neg Hx   . Rectal cancer Neg Hx   . Stomach cancer Neg Hx     Past Surgical History:  Procedure Laterality Date  . ABDOMINAL HYSTERECTOMY  2010   partial (with bladder sling); benign  . BUNIONECTOMY     left  . CESAREAN SECTION     x2  . GANGLION CYST EXCISION     left arm  . INCONTINENCE SURGERY  2010   Dr. Philis Pique  . SHOULDER ARTHROSCOPY WITH ROTATOR CUFF REPAIR AND SUBACROMIAL DECOMPRESSION Right 10/17/2020   Procedure: RIGHT SHOULDER ARTHROSCOPY, DEBRIDEMENT, BICEPS TENODESIS, ROTATOR CUFF TEAR REPAIR;  Surgeon: Meredith Pel, MD;  Location: [place];  Service: Orthopedics;  Laterality: Right;   Social History   Occupational History  . Occupation: Chiropodist: [employer]  Tobacco Use  . Smoking status: Former Smoker    Packs/day: 0.50    Years: 10.00    Pack  years: 5.00    Types: Cigarettes    Quit date: 07/19/2014    Years since quitting: 6.3  . Smokeless tobacco: Never Used  Vaping Use  . Vaping Use: Never used  Substance and Sexual Activity  . Alcohol use: Yes    Alcohol/week: 0.0 standard drinks    Comment: 1 drink a week on average  . Drug use: No  . Sexual activity: Yes    Partners: Male    Birth control/protection: Surgical

## 2020-12-07 ENCOUNTER — Ambulatory Visit (INDEPENDENT_AMBULATORY_CARE_PROVIDER_SITE_OTHER): Payer: BC Managed Care – PPO | Admitting: Rehabilitative and Restorative Service Providers"

## 2020-12-07 ENCOUNTER — Encounter: Payer: Self-pay | Admitting: Rehabilitative and Restorative Service Providers"

## 2020-12-07 DIAGNOSIS — G8929 Other chronic pain: Secondary | ICD-10-CM

## 2020-12-07 DIAGNOSIS — R293 Abnormal posture: Secondary | ICD-10-CM

## 2020-12-07 DIAGNOSIS — R600 Localized edema: Secondary | ICD-10-CM

## 2020-12-07 DIAGNOSIS — M25511 Pain in right shoulder: Secondary | ICD-10-CM | POA: Diagnosis not present

## 2020-12-07 DIAGNOSIS — M6281 Muscle weakness (generalized): Secondary | ICD-10-CM | POA: Diagnosis not present

## 2020-12-07 LAB — TSH: TSH: 2.08 u[IU]/mL (ref 0.450–4.500)

## 2020-12-07 NOTE — Therapy (Signed)
[facility] [address] [place], [place], [address] Phone: [phone]   Fax:  [phone]  Physical Therapy Treatment  Patient Details  Name: Sherry Huff MRN: [id number] Date of Birth: 04-10-1966 Referring Provider (PT): Dr. Marlou Sa   Encounter Date: 12/07/2020   PT End of Session - 12/07/20 1433    Visit Number 8    Number of Visits 16    Date for PT Re-Evaluation 01/19/21    Progress Note Due on Visit 10    PT Start Time 1430    PT Stop Time 1509    PT Time Calculation (min) 39 min    Activity Tolerance Patient tolerated treatment well    Behavior During Therapy [facility] for tasks assessed/performed           Past Medical History:  Diagnosis Date  . Allergy   . Colon polyp 03/2017   tubular adenoma; repeat colonoscopy in 03/2022  . Depression    in the past was on Prozac not presently   . GERD (gastroesophageal reflux disease)    not on meds  . Hyperlipidemia   . Hypertension   . Tobacco use disorder    quit 07/19/2014    Past Surgical History:  Procedure Laterality Date  . ABDOMINAL HYSTERECTOMY  2010   partial (with bladder sling); benign  . BUNIONECTOMY     left  . CESAREAN SECTION     x2  . GANGLION CYST EXCISION     left arm  . INCONTINENCE SURGERY  2010   Dr. Philis Pique  . SHOULDER ARTHROSCOPY WITH ROTATOR CUFF REPAIR AND SUBACROMIAL DECOMPRESSION Right 10/17/2020   Procedure: RIGHT SHOULDER ARTHROSCOPY, DEBRIDEMENT, BICEPS TENODESIS, ROTATOR CUFF TEAR REPAIR;  Surgeon: Meredith Pel, MD;  Location: [place];  Service: Orthopedics;  Laterality: Right;    There were no vitals filed for this visit.   Subjective Assessment - 12/07/20 1433    Subjective Pt. stated feeling less soreness complaints overall.  No pain indicated upon arrival today.    Limitations Lifting;House hold activities    Patient Stated Goals Reduce pain    Currently in Pain? No/denies    Pain Score 0-No pain    Pain Onset 1 to 4 weeks ago                              [facility] Adult PT Treatment/Exercise - 12/07/20 0001      Neuro Re-ed    Neuro Re-ed Details  rhythmic stabiliztions in 100 deg flexion c mild resistance, ER/IR in 45 deg abduction c mild resistance.  15-20 sec bouts of each      Shoulder Exercises: Supine   Horizontal ABduction Strengthening;Both   2 x 15   Theraband Level (Shoulder Horizontal ABduction) Level 3 (Green)    Flexion Right      Shoulder Exercises: Sidelying   External Rotation Right   2 lb x 10, 1 lb 2 x 15   Flexion Right;10 reps   very tough and poor to fair control   ABduction Right   3 x 10     Shoulder Exercises: Standing   Extension Both;20 reps    Theraband Level (Shoulder Extension) Level 3 (Green)    Theraband Level (Shoulder Row) Level 3 (Green)      Shoulder Exercises: ROM/Strengthening   UBE (Upper Arm Bike) Lvl 3.0 3 mins fwd/back each way  PT Short Term Goals - 11/30/20 1549      PT SHORT TERM GOAL #1   Title Patient will demonstrate independent use of home exercise program to maintain progress from in clinic treatments.    Baseline pt reporting compliance with HEP    Status Achieved             PT Long Term Goals - 11/27/20 1443      PT LONG TERM GOAL #1   Title Patient will demonstrate/report pain at worst less than or equal to 2/10 to facilitate minimal limitation in daily activity secondary to pain symptoms.    Status On-going      PT LONG TERM GOAL #2   Title Patient will demonstrate independent use of home exercise program to facilitate ability to maintain/progress functional gains from skilled physical therapy services.    Status On-going      PT LONG TERM GOAL #3   Title Patient will demonstrate return to work/recreational activity at previous level of function without limitations secondary due to condition (FOTO > = 69)    Status New      PT LONG TERM GOAL #4   Title Pt. will demonstrate Rt shoulder AROM  WFL s symptoms to facilitate usual reaching, dressing, self care at PLOF s limitation.    Status New      PT LONG TERM GOAL #5   Title Pt. will demonstrate Rt arm MMT equal to Lt c dynamometry with 15% of Lt to indicated ability to lift, carry and perform household activity at PLOF.    Status New                 Plan - 12/07/20 1506    Clinical Impression Statement Pt. is making some slow and steady progress overall in active movement tolerance but has still demonstrated fatigue difficulties.  Abduction and flexion movements most difficult performed at this time but showing some progression.  Continued skilled PT services indicated at this time to address impairments.    Personal Factors and Comorbidities Comorbidity 2    Comorbidities HTN, hyperlipidemia    Examination-Activity Limitations Sleep;Carry;Dressing;Hygiene/Grooming;Lift;Reach Overhead    Examination-Participation Restrictions Occupation;Community Activity;Cleaning;Interpersonal Relationship;Laundry;Meal Prep;Yard Work    Stability/Clinical Decision Making Stable/Uncomplicated    Rehab Potential Good    PT Frequency 2x / week    PT Duration Other (comment)    PT Treatment/Interventions ADLs/Self Care Home Management;Cryotherapy;Electrical Stimulation;Iontophoresis 4mg /ml Dexamethasone;Moist Heat;Traction;Balance training;Therapeutic exercise;Therapeutic activities;Functional mobility training;Stair training;Gait training;DME Instruction;Ultrasound;Neuromuscular re-education;Patient/family education;Passive range of motion;Spinal Manipulations;Joint Manipulations;Dry needling;Vasopneumatic Device;Manual techniques;Taping    PT Next Visit Plan Continue gravity reduced active movement/strengthening c easy progression into resistance, IR/ER eccentric control/reactive isometrics c resistance band    PT Home Exercise Plan [email or web address]    Consulted and Agree with Plan of Care Patient           Patient will benefit from skilled  therapeutic intervention in order to improve the following deficits and impairments:  Decreased endurance,Hypomobility,Increased edema,Pain,Impaired UE functional use,Decreased strength,Decreased activity tolerance,Decreased mobility,Increased muscle spasms,Impaired perceived functional ability,Improper body mechanics,Impaired flexibility,Postural dysfunction,Decreased coordination,Decreased range of motion  Visit Diagnosis: Chronic right shoulder pain  Muscle weakness (generalized)  Abnormal posture  Localized edema     Problem List Patient Active Problem List   Diagnosis Date Noted  . Complete tear of right rotator cuff   . Subluxation of tendon of long head of biceps   . Degenerative superior labral anterior-to-posterior (SLAP) tear of right shoulder   . Graves disease 01/26/2019  .  Hyperthyroidism 12/01/2018  . Vitamin D deficiency 11/07/2016  . Hypokalemia 11/07/2016  . Obesity, Class II, BMI 35-39.9, with comorbidity ([place]) 09/11/2015  . Essential hypertension, benign 10/19/2012  . Hyperlipidemia 06/18/2011  . Encounter for long-term (current) use of other medications 06/18/2011  . Need for prophylactic vaccination and inoculation against influenza 06/18/2011    Scot Jun, PT, DPT, OCS, ATC 12/07/20  3:09 PM    [facility] [address] [place], [place], [address] Phone: [phone]   Fax:  [phone]  Name: Sherry Huff MRN: [id number] Date of Birth: [birth year]

## 2020-12-11 ENCOUNTER — Other Ambulatory Visit: Payer: Self-pay

## 2020-12-11 ENCOUNTER — Ambulatory Visit (INDEPENDENT_AMBULATORY_CARE_PROVIDER_SITE_OTHER): Payer: BC Managed Care – PPO | Admitting: Rehabilitative and Restorative Service Providers"

## 2020-12-11 ENCOUNTER — Encounter: Payer: Self-pay | Admitting: Rehabilitative and Restorative Service Providers"

## 2020-12-11 DIAGNOSIS — G8929 Other chronic pain: Secondary | ICD-10-CM

## 2020-12-11 DIAGNOSIS — R600 Localized edema: Secondary | ICD-10-CM

## 2020-12-11 DIAGNOSIS — R293 Abnormal posture: Secondary | ICD-10-CM

## 2020-12-11 DIAGNOSIS — M6281 Muscle weakness (generalized): Secondary | ICD-10-CM | POA: Diagnosis not present

## 2020-12-11 DIAGNOSIS — M25511 Pain in right shoulder: Secondary | ICD-10-CM | POA: Diagnosis not present

## 2020-12-11 NOTE — Therapy (Addendum)
[facility] [address] [place], [place], [address] Phone: [phone]   Fax:  [phone]  Physical Therapy Treatment/Progress note  Patient Details  Name: Sherry Huff MRN: [id number] Date of Birth: [birth year] Referring Provider (PT): Dr. Marlou Sa   Encounter Date: 12/11/2020   Progress Note Reporting Period 11/10/2020 to 12/11/2020  See note below for Objective Data and Assessment of Progress/Goals.        PT End of Session - 12/11/20 1429    Visit Number 9    Number of Visits 16    Date for PT Re-Evaluation 01/19/21    Progress Note Due on Visit 10    PT Start Time 1429    PT Stop Time 1509    PT Time Calculation (min) 40 min    Activity Tolerance Patient tolerated treatment well    Behavior During Therapy [facility] for tasks assessed/performed           Past Medical History:  Diagnosis Date  . Allergy   . Colon polyp 03/2017   tubular adenoma; repeat colonoscopy in 03/2022  . Depression    in the past was on Prozac not presently   . GERD (gastroesophageal reflux disease)    not on meds  . Hyperlipidemia   . Hypertension   . Tobacco use disorder    quit 07/19/2014    Past Surgical History:  Procedure Laterality Date  . ABDOMINAL HYSTERECTOMY  2010   partial (with bladder sling); benign  . BUNIONECTOMY     left  . CESAREAN SECTION     x2  . GANGLION CYST EXCISION     left arm  . INCONTINENCE SURGERY  2010   Dr. Philis Pique  . SHOULDER ARTHROSCOPY WITH ROTATOR CUFF REPAIR AND SUBACROMIAL DECOMPRESSION Right 10/17/2020   Procedure: RIGHT SHOULDER ARTHROSCOPY, DEBRIDEMENT, BICEPS TENODESIS, ROTATOR CUFF TEAR REPAIR;  Surgeon: Meredith Pel, MD;  Location: [place];  Service: Orthopedics;  Laterality: Right;    There were no vitals filed for this visit.   Subjective Assessment - 12/11/20 1432    Subjective Pt. denied any symptoms today upon arrival.  Global Rating of Change +5 (quite a bit better) to this point in  treatment cycle.    Limitations Lifting;House hold activities    Patient Stated Goals Reduce pain    Currently in Pain? No/denies    Pain Score 0-No pain    Pain Onset 1 to 4 weeks ago              [facility] PT Assessment - 12/11/20 0001      Assessment   Medical Diagnosis Rt shoulder pain s/p debridement, BT, supraspinatus repair    Referring Provider (PT) Dr. Marlou Sa    Onset Date/Surgical Date 10/17/20    Hand Dominance Right      Observation/Other Assessments   Focus on Therapeutic Outcomes (FOTO)  update 63%      AROM   Right Shoulder Flexion 159 Degrees   measured in supine   Right Shoulder ABduction 160 Degrees   in supine   Right Shoulder Internal Rotation 75 Degrees   in supine 45 deg abduction   Right Shoulder External Rotation 80 Degrees   in 45 deg abduction in supine     Strength   Overall Strength Comments dynamometry in supine 90 deg flexion listed below    Right Shoulder Flexion 3+/5   5 lbs   Left Shoulder Flexion --   15.6 lbs  [facility] Adult PT Treatment/Exercise - 12/11/20 0001      Neuro Re-ed    Neuro Re-ed Details  rhythmic stabiliztions in 100 deg flexion c mild resistance, ER/IR in 45 deg abduction c mild resistance.  15-20 sec bouts of each      Shoulder Exercises: Supine   Flexion Right   3 x 15   Shoulder Flexion Weight (lbs) 1    Other Supine Exercises supine d2 extension green band Rt 2 x 10      Shoulder Exercises: Sidelying   Flexion Right;10 reps    ABduction Right   3 x 10   ABduction Weight (lbs) 1      Shoulder Exercises: Standing   Internal Rotation Right   3 x 10 c arm at side c towel   Theraband Level (Shoulder Internal Rotation) Level 3 (Green)    Other Standing Exercises reactive ER walk outs c arm at side green band 5 sec hold x 15      Shoulder Exercises: ROM/Strengthening   UBE (Upper Arm Bike) Lvl 3.5 3 mins fwd/back each way                    PT Short Term Goals - 11/30/20  1549      PT SHORT TERM GOAL #1   Title Patient will demonstrate independent use of home exercise program to maintain progress from in clinic treatments.    Baseline pt reporting compliance with HEP    Status Achieved             PT Long Term Goals - 12/11/20 1443      PT LONG TERM GOAL #1   Title Patient will demonstrate/report pain at worst less than or equal to 2/10 to facilitate minimal limitation in daily activity secondary to pain symptoms.    Time 10    Period Weeks    Status On-going    Target Date 01/19/21      PT LONG TERM GOAL #2   Title Patient will demonstrate independent use of home exercise program to facilitate ability to maintain/progress functional gains from skilled physical therapy services.    Time 10    Period Weeks    Status On-going    Target Date 01/19/21      PT LONG TERM GOAL #3   Title Patient will demonstrate return to work/recreational activity at previous level of function without limitations secondary due to condition (FOTO > = 69)    Status New      PT LONG TERM GOAL #4   Title Pt. will demonstrate Rt shoulder AROM WFL s symptoms to facilitate usual reaching, dressing, self care at PLOF s limitation.    Time 10    Period Weeks    Status On-going    Target Date 01/19/21      PT LONG TERM GOAL #5   Title Pt. will demonstrate Rt arm MMT equal to Lt c dynamometry with 15% of Lt to indicated ability to lift, carry and perform household activity at PLOF.    Time 10    Period Weeks    Status On-going    Target Date 01/19/21                 Plan - 12/11/20 1442    Clinical Impression Statement Pt. has attended 9 visits overall during course of treatment, reporting reduced pain complaints overall.  See objective data for updated information.  Noted improvement in active mobility measurements, close  to full [facility] or achieving WFL at this time.  Pt. does present c Rt shoulder strength defiicts that impair daily lifting/carrying and overhead  reaching against gravity.  Continued skilled PT services to benefit Pt. for more progression towards goals.    Personal Factors and Comorbidities Comorbidity 2    Comorbidities HTN, hyperlipidemia    Examination-Activity Limitations Sleep;Carry;Dressing;Hygiene/Grooming;Lift;Reach Overhead    Examination-Participation Restrictions Occupation;Community Activity;Cleaning;Interpersonal Relationship;Laundry;Meal Prep;Yard Work    Stability/Clinical Decision Making Stable/Uncomplicated    Rehab Potential Good    PT Frequency 2x / week    PT Duration Other (comment)    PT Treatment/Interventions ADLs/Self Care Home Management;Cryotherapy;Electrical Stimulation;Iontophoresis 4mg /ml Dexamethasone;Moist Heat;Traction;Balance training;Therapeutic exercise;Therapeutic activities;Functional mobility training;Stair training;Gait training;DME Instruction;Ultrasound;Neuromuscular re-education;Patient/family education;Passive range of motion;Spinal Manipulations;Joint Manipulations;Dry needling;Vasopneumatic Device;Manual techniques;Taping    PT Next Visit Plan Continue gravity reduced active movement/strengthening c easy progression into resistance, IR/ER eccentric control/reactive isometrics c resistance band    PT Home Exercise Plan [email or web address]    Consulted and Agree with Plan of Care Patient           Patient will benefit from skilled therapeutic intervention in order to improve the following deficits and impairments:  Decreased endurance,Hypomobility,Increased edema,Pain,Impaired UE functional use,Decreased strength,Decreased activity tolerance,Decreased mobility,Increased muscle spasms,Impaired perceived functional ability,Improper body mechanics,Impaired flexibility,Postural dysfunction,Decreased coordination,Decreased range of motion  Visit Diagnosis: Chronic right shoulder pain  Muscle weakness (generalized)  Abnormal posture  Localized edema     Problem List Patient Active Problem List    Diagnosis Date Noted  . Complete tear of right rotator cuff   . Subluxation of tendon of long head of biceps   . Degenerative superior labral anterior-to-posterior (SLAP) tear of right shoulder   . Graves disease 01/26/2019  . Hyperthyroidism 12/01/2018  . Vitamin D deficiency 11/07/2016  . Hypokalemia 11/07/2016  . Obesity, Class II, BMI 35-39.9, with comorbidity ([place]) 09/11/2015  . Essential hypertension, benign 10/19/2012  . Hyperlipidemia 06/18/2011  . Encounter for long-term (current) use of other medications 06/18/2011  . Need for prophylactic vaccination and inoculation against influenza 06/18/2011   Scot Jun, PT, DPT, OCS, ATC 12/11/20  3:04 PM    [facility] [address] [place], [place], [address] Phone: [phone]   Fax:  [phone]  Name: Sherry Huff MRN: [id number] Date of Birth: 03/21/1966

## 2020-12-14 ENCOUNTER — Encounter: Payer: Self-pay | Admitting: Rehabilitative and Restorative Service Providers"

## 2020-12-14 ENCOUNTER — Ambulatory Visit (INDEPENDENT_AMBULATORY_CARE_PROVIDER_SITE_OTHER): Payer: BC Managed Care – PPO | Admitting: Rehabilitative and Restorative Service Providers"

## 2020-12-14 ENCOUNTER — Other Ambulatory Visit: Payer: Self-pay

## 2020-12-14 DIAGNOSIS — R600 Localized edema: Secondary | ICD-10-CM | POA: Diagnosis not present

## 2020-12-14 DIAGNOSIS — M25511 Pain in right shoulder: Secondary | ICD-10-CM

## 2020-12-14 DIAGNOSIS — R293 Abnormal posture: Secondary | ICD-10-CM

## 2020-12-14 DIAGNOSIS — M6281 Muscle weakness (generalized): Secondary | ICD-10-CM | POA: Diagnosis not present

## 2020-12-14 DIAGNOSIS — G8929 Other chronic pain: Secondary | ICD-10-CM

## 2020-12-14 NOTE — Patient Instructions (Signed)
Access Code: [email or web address] URL: [email or web address] Date: 12/14/2020 Prepared by: Scot Jun  Exercises Supine Shoulder External Rotation in 45 Degrees Abduction AAROM with [place] (Mirrored) - 2-3 x daily - 7 x weekly - 1-2 sets - 10 reps - 5 hold Seated Scapular Retraction - 2 x daily - 7 x weekly - 1 sets - 10 reps - 5 hold Supine Shoulder Flexion Extension Full Range AROM - 2 x daily - 7 x weekly - 3 sets - 10-15 reps Sidelying Shoulder Abduction Palm Forward - 1 x daily - 7 x weekly - 3 sets - 10-15 reps Sidelying Shoulder External Rotation (Mirrored) - 2 x daily - 7 x weekly - 3 sets - 10-15 reps Supine PNF D2 Flexion with Resistance (Mirrored) - 2 x daily - 7 x weekly - 3 sets - 10 reps Shoulder External Rotation Reactive Isometrics (Mirrored) - 1 x daily - 7 x weekly - 1 sets - 15 reps - 5 hold Shoulder Internal Rotation with Resistance - 1 x daily - 7 x weekly - 3 sets - 10 reps Standing Bilateral Shoulder Scaption Wall Slide - 2 x daily - 7 x weekly - 1 sets - 10-15 reps

## 2020-12-14 NOTE — Therapy (Signed)
[facility] [address] [place], [place], [address] Phone: [phone]   Fax:  [phone]  Physical Therapy Treatment  Patient Details  Name: Sherry Huff MRN: [id number] Date of Birth: May 11, 1966 Referring Provider (PT): Dr. Marlou Sa   Encounter Date: 12/14/2020   PT End of Session - 12/14/20 1434    Visit Number 10    Number of Visits 16    Date for PT Re-Evaluation 01/19/21    Progress Note Due on Visit 16    PT Start Time 1429    PT Stop Time 1508    PT Time Calculation (min) 39 min    Activity Tolerance Patient tolerated treatment well    Behavior During Therapy [facility] for tasks assessed/performed           Past Medical History:  Diagnosis Date  . Allergy   . Colon polyp 03/2017   tubular adenoma; repeat colonoscopy in 03/2022  . Depression    in the past was on Prozac not presently   . GERD (gastroesophageal reflux disease)    not on meds  . Hyperlipidemia   . Hypertension   . Tobacco use disorder    quit 07/19/2014    Past Surgical History:  Procedure Laterality Date  . ABDOMINAL HYSTERECTOMY  2010   partial (with bladder sling); benign  . BUNIONECTOMY     left  . CESAREAN SECTION     x2  . GANGLION CYST EXCISION     left arm  . INCONTINENCE SURGERY  2010   Dr. Philis Pique  . SHOULDER ARTHROSCOPY WITH ROTATOR CUFF REPAIR AND SUBACROMIAL DECOMPRESSION Right 10/17/2020   Procedure: RIGHT SHOULDER ARTHROSCOPY, DEBRIDEMENT, BICEPS TENODESIS, ROTATOR CUFF TEAR REPAIR;  Surgeon: Meredith Pel, MD;  Location: [place];  Service: Orthopedics;  Laterality: Right;    There were no vitals filed for this visit.   Subjective Assessment - 12/14/20 1433    Subjective Pt. indicated no pain at all upon arrival today, getting better overall.    Limitations Lifting;House hold activities    Patient Stated Goals Reduce pain    Currently in Pain? No/denies    Pain Score 0-No pain    Pain Onset 1 to 4 weeks ago                              [facility] Adult PT Treatment/Exercise - 12/14/20 0001      Shoulder Exercises: Supine   Other Supine Exercises supine d2 extension green band Rt 2 x 10      Shoulder Exercises: Sidelying   ABduction Right   3 x 10   ABduction Weight (lbs) 1      Shoulder Exercises: Standing   Internal Rotation Right   3 x 10   Theraband Level (Shoulder Internal Rotation) Level 3 (Green)    Other Standing Exercises reactive ER walk outs c arm at side green band 5 sec hold x 15, wall slides scaption x 10 c lift off    Other Standing Exercises wall push up c serratus press hold 2 seconds 2 x 10      Shoulder Exercises: ROM/Strengthening   UBE (Upper Arm Bike) Lvl 3.5 4 mins fwd/back each way, 15 sec interval faster from :45-:60 each minute      Shoulder Exercises: Stretch   Other Shoulder Stretches supine cross arm stretch 30 sec x 3  PT Education - 12/14/20 1504    Education Details HEP revisions    Person(s) Educated Patient    Methods Explanation;Handout;Verbal cues    Comprehension Verbalized understanding;Returned demonstration            PT Short Term Goals - 11/30/20 1549      PT SHORT TERM GOAL #1   Title Patient will demonstrate independent use of home exercise program to maintain progress from in clinic treatments.    Baseline pt reporting compliance with HEP    Status Achieved             PT Long Term Goals - 12/11/20 1443      PT LONG TERM GOAL #1   Title Patient will demonstrate/report pain at worst less than or equal to 2/10 to facilitate minimal limitation in daily activity secondary to pain symptoms.    Time 10    Period Weeks    Status On-going    Target Date 01/19/21      PT LONG TERM GOAL #2   Title Patient will demonstrate independent use of home exercise program to facilitate ability to maintain/progress functional gains from skilled physical therapy services.    Time 10    Period Weeks    Status  On-going    Target Date 01/19/21      PT LONG TERM GOAL #3   Title Patient will demonstrate return to work/recreational activity at previous level of function without limitations secondary due to condition (FOTO > = 69)    Status New      PT LONG TERM GOAL #4   Title Pt. will demonstrate Rt shoulder AROM WFL s symptoms to facilitate usual reaching, dressing, self care at PLOF s limitation.    Time 10    Period Weeks    Status On-going    Target Date 01/19/21      PT LONG TERM GOAL #5   Title Pt. will demonstrate Rt arm MMT equal to Lt c dynamometry with 15% of Lt to indicated ability to lift, carry and perform household activity at PLOF.    Time 10    Period Weeks    Status On-going    Target Date 01/19/21                 Plan - 12/14/20 1454    Clinical Impression Statement Progressing in overall performance c mild impairments in mobility noted today at end range horizontal adduction and elevation.  Strengthening plan still important to improve function.    Personal Factors and Comorbidities Comorbidity 2    Comorbidities HTN, hyperlipidemia    Examination-Activity Limitations Sleep;Carry;Dressing;Hygiene/Grooming;Lift;Reach Overhead    Examination-Participation Restrictions Occupation;Community Activity;Cleaning;Interpersonal Relationship;Laundry;Meal Prep;Yard Work    Stability/Clinical Decision Making Stable/Uncomplicated    Rehab Potential Good    PT Frequency 2x / week    PT Duration Other (comment)    PT Treatment/Interventions ADLs/Self Care Home Management;Cryotherapy;Electrical Stimulation;Iontophoresis 4mg /ml Dexamethasone;Moist Heat;Traction;Balance training;Therapeutic exercise;Therapeutic activities;Functional mobility training;Stair training;Gait training;DME Instruction;Ultrasound;Neuromuscular re-education;Patient/family education;Passive range of motion;Spinal Manipulations;Joint Manipulations;Dry needling;Vasopneumatic Device;Manual techniques;Taping    PT  Next Visit Plan Consider transitioning into elevation against gravity with avoiding shrug.  IR/ER eccentric control/reactive isometrics c resistance band    PT Home Exercise Plan [id number]    Consulted and Agree with Plan of Care Patient           Patient will benefit from skilled therapeutic intervention in order to improve the following deficits and impairments:  Decreased endurance,Hypomobility,Increased edema,Pain,Impaired UE functional use,Decreased strength,Decreased  activity tolerance,Decreased mobility,Increased muscle spasms,Impaired perceived functional ability,Improper body mechanics,Impaired flexibility,Postural dysfunction,Decreased coordination,Decreased range of motion  Visit Diagnosis: Chronic right shoulder pain  Muscle weakness (generalized)  Abnormal posture  Localized edema     Problem List Patient Active Problem List   Diagnosis Date Noted  . Complete tear of right rotator cuff   . Subluxation of tendon of long head of biceps   . Degenerative superior labral anterior-to-posterior (SLAP) tear of right shoulder   . Graves disease 01/26/2019  . Hyperthyroidism 12/01/2018  . Vitamin D deficiency 11/07/2016  . Hypokalemia 11/07/2016  . Obesity, Class II, BMI 35-39.9, with comorbidity ([place]) 09/11/2015  . Essential hypertension, benign 10/19/2012  . Hyperlipidemia 06/18/2011  . Encounter for long-term (current) use of other medications 06/18/2011  . Need for prophylactic vaccination and inoculation against influenza 06/18/2011    Scot Jun, PT, DPT, OCS, ATC 12/14/20  3:05 PM    [facility] [address] [place], [place], [address] Phone: [phone]   Fax:  [phone]  Name: Sherry Huff MRN: [id number] Date of Birth: 1965-11-24

## 2020-12-25 ENCOUNTER — Other Ambulatory Visit: Payer: Self-pay

## 2020-12-25 ENCOUNTER — Encounter: Payer: Self-pay | Admitting: Rehabilitative and Restorative Service Providers"

## 2020-12-25 ENCOUNTER — Ambulatory Visit (INDEPENDENT_AMBULATORY_CARE_PROVIDER_SITE_OTHER): Payer: BC Managed Care – PPO | Admitting: Rehabilitative and Restorative Service Providers"

## 2020-12-25 DIAGNOSIS — M6281 Muscle weakness (generalized): Secondary | ICD-10-CM | POA: Diagnosis not present

## 2020-12-25 DIAGNOSIS — R293 Abnormal posture: Secondary | ICD-10-CM | POA: Diagnosis not present

## 2020-12-25 DIAGNOSIS — M25511 Pain in right shoulder: Secondary | ICD-10-CM

## 2020-12-25 DIAGNOSIS — G8929 Other chronic pain: Secondary | ICD-10-CM

## 2020-12-25 DIAGNOSIS — R600 Localized edema: Secondary | ICD-10-CM | POA: Diagnosis not present

## 2020-12-25 NOTE — Therapy (Signed)
[facility] [address] [place], [place], [address] Phone: [phone]   Fax:  [phone]  Physical Therapy Treatment  Patient Details  Name: Sherry Huff MRN: [id number] Date of Birth: Jun 07, 1966 Referring Provider (PT): Dr. Marlou Sa   Encounter Date: 12/25/2020   PT End of Session - 12/25/20 1519    Visit Number 11    Number of Visits 16    Date for PT Re-Evaluation 01/19/21    Progress Note Due on Visit 16    PT Start Time 1512    PT Stop Time 1552    PT Time Calculation (min) 40 min    Activity Tolerance Patient tolerated treatment well    Behavior During Therapy [facility] for tasks assessed/performed           Past Medical History:  Diagnosis Date  . Allergy   . Colon polyp 03/2017   tubular adenoma; repeat colonoscopy in 03/2022  . Depression    in the past was on Prozac not presently   . GERD (gastroesophageal reflux disease)    not on meds  . Hyperlipidemia   . Hypertension   . Tobacco use disorder    quit 07/19/2014    Past Surgical History:  Procedure Laterality Date  . ABDOMINAL HYSTERECTOMY  2010   partial (with bladder sling); benign  . BUNIONECTOMY     left  . CESAREAN SECTION     x2  . GANGLION CYST EXCISION     left arm  . INCONTINENCE SURGERY  2010   Dr. Philis Pique  . SHOULDER ARTHROSCOPY WITH ROTATOR CUFF REPAIR AND SUBACROMIAL DECOMPRESSION Right 10/17/2020   Procedure: RIGHT SHOULDER ARTHROSCOPY, DEBRIDEMENT, BICEPS TENODESIS, ROTATOR CUFF TEAR REPAIR;  Surgeon: Meredith Pel, MD;  Location: [place];  Service: Orthopedics;  Laterality: Right;    There were no vitals filed for this visit.       [facility] PT Assessment - 12/25/20 0001      Assessment   Medical Diagnosis Rt shoulder pain s/p debridement, BT, supraspinatus repair    Referring Provider (PT) Dr. Marlou Sa    Onset Date/Surgical Date 10/17/20    Hand Dominance Right      Strength   Right Shoulder Flexion 3+/5   6 lbs in supine 90 deg  flexion                        [facility] Adult PT Treatment/Exercise - 12/25/20 0001      Neuro Re-ed    Neuro Re-ed Details  rhythmic stabiliztions in 100 deg flexion c mild resistance, ER/IR in 45 deg abduction c mild resistance.  15-20 sec bouts of each      Shoulder Exercises: Supine   Flexion Right   25 deg bed elevation 1 lb 3 x 15   Shoulder Flexion Weight (lbs) 1    Other Supine Exercises supine 100 deg flexion (in 25 deg bed elevation) circles cw, ccw 3 lbs 20 x 2 each way      Shoulder Exercises: Standing   External Rotation Right   eccentric Rt arm at side c towel 3 x 10   Theraband Level (Shoulder External Rotation) Level 3 (Green)    Internal Rotation Right   2 x 15   Theraband Level (Shoulder Internal Rotation) Level 3 (Green)    Other Standing Exercises reactive ER walk outs c arm at side green band 10 seconds x 10    Other Standing Exercises wall push up c  serratus press hold 2 seconds 2 x 10      Shoulder Exercises: ROM/Strengthening   UBE (Upper Arm Bike) Lvl 3.5 4 mins fwd/back each                    PT Short Term Goals - 11/30/20 1549      PT SHORT TERM GOAL #1   Title Patient will demonstrate independent use of home exercise program to maintain progress from in clinic treatments.    Baseline pt reporting compliance with HEP    Status Achieved             PT Long Term Goals - 12/11/20 1443      PT LONG TERM GOAL #1   Title Patient will demonstrate/report pain at worst less than or equal to 2/10 to facilitate minimal limitation in daily activity secondary to pain symptoms.    Time 10    Period Weeks    Status On-going    Target Date 01/19/21      PT LONG TERM GOAL #2   Title Patient will demonstrate independent use of home exercise program to facilitate ability to maintain/progress functional gains from skilled physical therapy services.    Time 10    Period Weeks    Status On-going    Target Date 01/19/21      PT LONG  TERM GOAL #3   Title Patient will demonstrate return to work/recreational activity at previous level of function without limitations secondary due to condition (FOTO > = 69)    Status New      PT LONG TERM GOAL #4   Title Pt. will demonstrate Rt shoulder AROM WFL s symptoms to facilitate usual reaching, dressing, self care at PLOF s limitation.    Time 10    Period Weeks    Status On-going    Target Date 01/19/21      PT LONG TERM GOAL #5   Title Pt. will demonstrate Rt arm MMT equal to Lt c dynamometry with 15% of Lt to indicated ability to lift, carry and perform household activity at PLOF.    Time 10    Period Weeks    Status On-going    Target Date 01/19/21                 Plan - 12/25/20 1547    Clinical Impression Statement Active movement quality has improved to this point but noted weakness against resistance still not in elevation attempts.  Continued training and intervention to improve that strength is warranted.    Personal Factors and Comorbidities Comorbidity 2    Comorbidities HTN, hyperlipidemia    Examination-Activity Limitations Sleep;Carry;Dressing;Hygiene/Grooming;Lift;Reach Overhead    Examination-Participation Restrictions Occupation;Community Activity;Cleaning;Interpersonal Relationship;Laundry;Meal Prep;Yard Work    Stability/Clinical Decision Making Stable/Uncomplicated    Rehab Potential Good    PT Frequency 2x / week    PT Duration Other (comment)    PT Treatment/Interventions ADLs/Self Care Home Management;Cryotherapy;Electrical Stimulation;Iontophoresis 4mg /ml Dexamethasone;Moist Heat;Traction;Balance training;Therapeutic exercise;Therapeutic activities;Functional mobility training;Stair training;Gait training;DME Instruction;Ultrasound;Neuromuscular re-education;Patient/family education;Passive range of motion;Spinal Manipulations;Joint Manipulations;Dry needling;Vasopneumatic Device;Manual techniques;Taping    PT Next Visit Plan Consider  transitioning into elevation against gravity with avoiding shrug.    PT Home Exercise Plan [place] and Agree with Plan of Care Patient           Patient will benefit from skilled therapeutic intervention in order to improve the following deficits and impairments:  Decreased endurance,Hypomobility,Increased edema,Pain,Impaired UE functional use,Decreased strength,Decreased  activity tolerance,Decreased mobility,Increased muscle spasms,Impaired perceived functional ability,Improper body mechanics,Impaired flexibility,Postural dysfunction,Decreased coordination,Decreased range of motion  Visit Diagnosis: Chronic right shoulder pain  Muscle weakness (generalized)  Abnormal posture  Localized edema     Problem List Patient Active Problem List   Diagnosis Date Noted  . Complete tear of right rotator cuff   . Subluxation of tendon of long head of biceps   . Degenerative superior labral anterior-to-posterior (SLAP) tear of right shoulder   . Graves disease 01/26/2019  . Hyperthyroidism 12/01/2018  . Vitamin D deficiency 11/07/2016  . Hypokalemia 11/07/2016  . Obesity, Class II, BMI 35-39.9, with comorbidity ([place]) 09/11/2015  . Essential hypertension, benign 10/19/2012  . Hyperlipidemia 06/18/2011  . Encounter for long-term (current) use of other medications 06/18/2011  . Need for prophylactic vaccination and inoculation against influenza 06/18/2011    Scot Jun, PT, DPT, OCS, ATC 12/25/20  3:52 PM    [facility] [address] [place], [place], [address] Phone: [phone]   Fax:  [phone]  Name: Sherry Huff MRN: [id number] Date of Birth: March 08, 1966

## 2020-12-28 ENCOUNTER — Ambulatory Visit (INDEPENDENT_AMBULATORY_CARE_PROVIDER_SITE_OTHER): Payer: BC Managed Care – PPO | Admitting: Rehabilitative and Restorative Service Providers"

## 2020-12-28 ENCOUNTER — Other Ambulatory Visit: Payer: Self-pay

## 2020-12-28 ENCOUNTER — Encounter: Payer: Self-pay | Admitting: Rehabilitative and Restorative Service Providers"

## 2020-12-28 DIAGNOSIS — R293 Abnormal posture: Secondary | ICD-10-CM

## 2020-12-28 DIAGNOSIS — M25511 Pain in right shoulder: Secondary | ICD-10-CM | POA: Diagnosis not present

## 2020-12-28 DIAGNOSIS — M6281 Muscle weakness (generalized): Secondary | ICD-10-CM

## 2020-12-28 DIAGNOSIS — G8929 Other chronic pain: Secondary | ICD-10-CM

## 2020-12-28 DIAGNOSIS — R600 Localized edema: Secondary | ICD-10-CM | POA: Diagnosis not present

## 2020-12-28 NOTE — Therapy (Signed)
[facility] [address] [place], [place], [address] Phone: [phone]   Fax:  [phone]  Physical Therapy Treatment  Patient Details  Name: Sherry Huff MRN: [id number] Date of Birth: 03/27/1966 Referring Provider (PT): Dr. Marlou Sa   Encounter Date: 12/28/2020   PT End of Session - 12/28/20 1557     Visit Number 12    Number of Visits 16    Date for PT Re-Evaluation 01/19/21    Progress Note Due on Visit 16    PT Start Time 1556    PT Stop Time 3559    PT Time Calculation (min) 39 min    Activity Tolerance Patient tolerated treatment well    Behavior During Therapy [facility] for tasks assessed/performed             Past Medical History:  Diagnosis Date   Allergy    Colon polyp 03/2017   tubular adenoma; repeat colonoscopy in 03/2022   Depression    in the past was on Prozac not presently    GERD (gastroesophageal reflux disease)    not on meds   Hyperlipidemia    Hypertension    Tobacco use disorder    quit 07/19/2014    Past Surgical History:  Procedure Laterality Date   ABDOMINAL HYSTERECTOMY  2010   partial (with bladder sling); benign   BUNIONECTOMY     left   CESAREAN SECTION     x2   GANGLION CYST EXCISION     left arm   INCONTINENCE SURGERY  2010   Dr. Philis Pique   SHOULDER ARTHROSCOPY WITH ROTATOR CUFF REPAIR AND SUBACROMIAL DECOMPRESSION Right 10/17/2020   Procedure: RIGHT SHOULDER ARTHROSCOPY, DEBRIDEMENT, BICEPS TENODESIS, ROTATOR CUFF TEAR REPAIR;  Surgeon: Meredith Pel, MD;  Location: [place];  Service: Orthopedics;  Laterality: Right;    There were no vitals filed for this visit.   Subjective Assessment - 12/28/20 1556     Subjective Pt. indicated no pain, just busy at work.    Limitations Lifting;House hold activities    Patient Stated Goals Reduce pain    Currently in Pain? No/denies    Pain Score 0-No pain    Pain Onset 1 to 4 weeks ago                                [facility] Adult PT Treatment/Exercise - 12/28/20 0001       Shoulder Exercises: Prone   Other Prone Exercises prone y, t 2 x 10 Rt arm      Shoulder Exercises: Sidelying   External Rotation Right   3 x 10   External Rotation Weight (lbs) 2    Flexion Right   3 x 10   ABduction Right   3 x10   ABduction Weight (lbs) 2      Shoulder Exercises: Standing   Extension Both   3 x 10 blue   Theraband Level (Shoulder Extension) Level 4 (Blue)    Retraction Right;20 reps;Left   5 sec hold blue band   Other Standing Exercises standing wall scaption slides c lift off x 10      Shoulder Exercises: ROM/Strengthening   UBE (Upper Arm Bike) Lvl 3.5 3 mins fwd/back each way with 15 second intervals faster at :45 of each minute      Shoulder Exercises: Stretch   Other Shoulder Stretches rope hand behind back 30 sec x 5 Rt  PT Short Term Goals - 11/30/20 1549       PT SHORT TERM GOAL #1   Title Patient will demonstrate independent use of home exercise program to maintain progress from in clinic treatments.    Baseline pt reporting compliance with HEP    Status Achieved               PT Long Term Goals - 12/11/20 1443       PT LONG TERM GOAL #1   Title Patient will demonstrate/report pain at worst less than or equal to 2/10 to facilitate minimal limitation in daily activity secondary to pain symptoms.    Time 10    Period Weeks    Status On-going    Target Date 01/19/21      PT LONG TERM GOAL #2   Title Patient will demonstrate independent use of home exercise program to facilitate ability to maintain/progress functional gains from skilled physical therapy services.    Time 10    Period Weeks    Status On-going    Target Date 01/19/21      PT LONG TERM GOAL #3   Title Patient will demonstrate return to work/recreational activity at previous level of function without limitations secondary due to condition (FOTO > = 69)    Status New      PT LONG  TERM GOAL #4   Title Pt. will demonstrate Rt shoulder AROM WFL s symptoms to facilitate usual reaching, dressing, self care at PLOF s limitation.    Time 10    Period Weeks    Status On-going    Target Date 01/19/21      PT LONG TERM GOAL #5   Title Pt. will demonstrate Rt arm MMT equal to Lt c dynamometry with 15% of Lt to indicated ability to lift, carry and perform household activity at PLOF.    Time 10    Period Weeks    Status On-going    Target Date 01/19/21                   Plan - 12/28/20 1607     Clinical Impression Statement Elevation forward attempt against gravity produced shrug at around 70 degrees today.    Personal Factors and Comorbidities Comorbidity 2    Comorbidities HTN, hyperlipidemia    Examination-Activity Limitations Sleep;Carry;Dressing;Hygiene/Grooming;Lift;Reach Overhead    Examination-Participation Restrictions Occupation;Community Activity;Cleaning;Interpersonal Relationship;Laundry;Meal Prep;Yard Work    Stability/Clinical Decision Making Stable/Uncomplicated    Rehab Potential Good    PT Frequency 2x / week    PT Duration Other (comment)    PT Treatment/Interventions ADLs/Self Care Home Management;Cryotherapy;Electrical Stimulation;Iontophoresis 4mg /ml Dexamethasone;Moist Heat;Traction;Balance training;Therapeutic exercise;Therapeutic activities;Functional mobility training;Stair training;Gait training;DME Instruction;Ultrasound;Neuromuscular re-education;Patient/family education;Passive range of motion;Spinal Manipulations;Joint Manipulations;Dry needling;Vasopneumatic Device;Manual techniques;Taping    PT Next Visit Plan Consider transitioning into elevation against gravity with avoiding shrug.    PT Home Exercise Plan [place] and Agree with Plan of Care Patient             Patient will benefit from skilled therapeutic intervention in order to improve the following deficits and impairments:  Decreased endurance,  Hypomobility, Increased edema, Pain, Impaired UE functional use, Decreased strength, Decreased activity tolerance, Decreased mobility, Increased muscle spasms, Impaired perceived functional ability, Improper body mechanics, Impaired flexibility, Postural dysfunction, Decreased coordination, Decreased range of motion  Visit Diagnosis: Chronic right shoulder pain  Muscle weakness (generalized)  Abnormal posture  Localized edema     Problem List Patient Active  Problem List   Diagnosis Date Noted   Complete tear of right rotator cuff    Subluxation of tendon of long head of biceps    Degenerative superior labral anterior-to-posterior (SLAP) tear of right shoulder    Graves disease 01/26/2019   Hyperthyroidism 12/01/2018   Vitamin D deficiency 11/07/2016   Hypokalemia 11/07/2016   Obesity, Class II, BMI 35-39.9, with comorbidity ([place]) 09/11/2015   Essential hypertension, benign 10/19/2012   Hyperlipidemia 06/18/2011   Encounter for long-term (current) use of other medications 06/18/2011   Need for prophylactic vaccination and inoculation against influenza 06/18/2011    Scot Jun, PT, DPT, OCS, ATC 12/28/20  4:28 PM    [facility] [address] [place], [place], [address] Phone: [phone]   Fax:  [phone]  Name: Sherry Huff MRN: [id number] Date of Birth: 12/23/1965

## 2021-01-01 ENCOUNTER — Ambulatory Visit (INDEPENDENT_AMBULATORY_CARE_PROVIDER_SITE_OTHER): Payer: BC Managed Care – PPO | Admitting: Rehabilitative and Restorative Service Providers"

## 2021-01-01 ENCOUNTER — Encounter: Payer: Self-pay | Admitting: Rehabilitative and Restorative Service Providers"

## 2021-01-01 ENCOUNTER — Other Ambulatory Visit: Payer: Self-pay

## 2021-01-01 DIAGNOSIS — M25511 Pain in right shoulder: Secondary | ICD-10-CM | POA: Diagnosis not present

## 2021-01-01 DIAGNOSIS — R600 Localized edema: Secondary | ICD-10-CM

## 2021-01-01 DIAGNOSIS — M6281 Muscle weakness (generalized): Secondary | ICD-10-CM

## 2021-01-01 DIAGNOSIS — R293 Abnormal posture: Secondary | ICD-10-CM

## 2021-01-01 DIAGNOSIS — G8929 Other chronic pain: Secondary | ICD-10-CM

## 2021-01-01 NOTE — Therapy (Signed)
[facility] [address] [place], [place], [address] Phone: [phone]   Fax:  [phone]  Physical Therapy Treatment/Progress Note/Recertification  Patient Details  Name: PSALM ARMAN MRN: [id number] Date of Birth: October 21, 1965 Referring Provider (PT): Dr. Marlou Sa  Progress Note Reporting Period 12/14/2020 to 01/01/2021  See note below for Objective Data and Assessment of Progress/Goals.     Encounter Date: 01/01/2021   PT End of Session - 01/01/21 1535     Visit Number 13    Number of Visits 23    Date for PT Re-Evaluation 02/12/21    Progress Note Due on Visit 23    PT Start Time 1515    PT Stop Time 1555    PT Time Calculation (min) 40 min    Activity Tolerance Patient tolerated treatment well    Behavior During Therapy [facility] for tasks assessed/performed             Past Medical History:  Diagnosis Date   Allergy    Colon polyp 03/2017   tubular adenoma; repeat colonoscopy in 03/2022   Depression    in the past was on Prozac not presently    GERD (gastroesophageal reflux disease)    not on meds   Hyperlipidemia    Hypertension    Tobacco use disorder    quit 07/19/2014    Past Surgical History:  Procedure Laterality Date   ABDOMINAL HYSTERECTOMY  2010   partial (with bladder sling); benign   BUNIONECTOMY     left   CESAREAN SECTION     x2   GANGLION CYST EXCISION     left arm   INCONTINENCE SURGERY  2010   Dr. Philis Pique   SHOULDER ARTHROSCOPY WITH ROTATOR CUFF REPAIR AND SUBACROMIAL DECOMPRESSION Right 10/17/2020   Procedure: RIGHT SHOULDER ARTHROSCOPY, DEBRIDEMENT, BICEPS TENODESIS, ROTATOR CUFF TEAR REPAIR;  Surgeon: Meredith Pel, MD;  Location: [place];  Service: Orthopedics;  Laterality: Right;    There were no vitals filed for this visit.   Subjective Assessment - 01/01/21 1526     Subjective Pt. indicated pain at worst in last week at 2/10. Global rating of change +5 quite a bit better     Limitations Lifting;House hold activities    Patient Stated Goals Reduce pain    Currently in Pain? No/denies    Pain Score 0-No pain    Pain Location Shoulder    Pain Onset 1 to 4 weeks ago                [facility] PT Assessment - 01/01/21 0001       Assessment   Medical Diagnosis Rt shoulder pain s/p debridement, BT, supraspinatus repair    Referring Provider (PT) Dr. Marlou Sa    Onset Date/Surgical Date 10/17/20    Hand Dominance Right      AROM   Overall AROM Comments measurements in chart below in supine.  standing flexion c mid range shrug mild but full range noted.    Right Shoulder Flexion 160 Degrees    Right Shoulder ABduction 165 Degrees    Right Shoulder Internal Rotation 80 Degrees    Right Shoulder External Rotation 90 Degrees      Strength   Right Shoulder Flexion 3+/5   8.0, 7.7 lbs in 90 degrees flexion supine   Right Shoulder External Rotation 4/5   14.2, 14.3   Left Shoulder Flexion --   15.6 lbs (original testing)  [facility] PT Treatment/Exercise - 01/01/21 0001       Neuro Re-ed    Neuro Re-ed Details  rhythmic stabiliztions in 100 deg flexion c mild resistance, 15-20 second bouts      Shoulder Exercises: Supine   Horizontal ABduction Both   3 x 15   Theraband Level (Shoulder Horizontal ABduction) Level 3 (Green)      Shoulder Exercises: Sidelying   External Rotation Right   3 x 10   External Rotation Weight (lbs) 3    Flexion Right   1 lb 2 x 10   Flexion Weight (lbs) 1      Shoulder Exercises: Standing   Other Standing Exercises standing er c 45 deg flexion punch x 10, reactive walk outs ER c arm at side 5 sec hold x 10      Shoulder Exercises: ROM/Strengthening   UBE (Upper Arm Bike) Lvl 3.5 3 mins fwd/back each way with 15 second intervals faster at :45 of each minute                      PT Short Term Goals - 11/30/20 1549       PT SHORT TERM GOAL #1   Title Patient will  demonstrate independent use of home exercise program to maintain progress from in clinic treatments.    Baseline pt reporting compliance with HEP    Status Achieved               PT Long Term Goals - 01/01/21 1544       PT LONG TERM GOAL #1   Title Patient will demonstrate/report pain at worst less than or equal to 2/10 to facilitate minimal limitation in daily activity secondary to pain symptoms.    Time 10    Period Weeks    Status Achieved      PT LONG TERM GOAL #2   Title Patient will demonstrate independent use of home exercise program to facilitate ability to maintain/progress functional gains from skilled physical therapy services.    Time 10    Period Weeks    Status Achieved      PT LONG TERM GOAL #3   Title Patient will demonstrate return to work/recreational activity at previous level of function without limitations secondary due to condition (FOTO > = 69)    Time 6    Status Revised    Target Date 02/12/21      PT LONG TERM GOAL #4   Title Pt. will demonstrate Rt shoulder AROM WFL s symptoms to facilitate usual reaching, dressing, self care at PLOF s limitation.    Time 10    Period Weeks    Status Achieved      PT LONG TERM GOAL #5   Title Pt. will demonstrate Rt arm MMT equal to Lt c dynamometry with 15% of Lt to indicated ability to lift, carry and perform household activity at PLOF.    Time 10    Period Weeks    Status Revised    Target Date 02/12/21                   Plan - 01/01/21 1542     Clinical Impression Statement Pt. has attended 13 visits overall during course of treatment.  See objective data for updated information.  Overall ROM looking good at this time in supine.  Shrug noted in elevation attempts secondary to documented weakness.  Pt. was made gains but may continue  to benefit from skilled PT services to progress towards goals and PLOF.    Personal Factors and Comorbidities Comorbidity 2    Comorbidities HTN, hyperlipidemia     Examination-Activity Limitations Sleep;Carry;Dressing;Hygiene/Grooming;Lift;Reach Overhead    Examination-Participation Restrictions Occupation;Community Activity;Cleaning;Interpersonal Relationship;Laundry;Meal Prep;Yard Work    Stability/Clinical Decision Making Stable/Uncomplicated    Rehab Potential Good    PT Frequency 2x / week    PT Duration Other (comment)    PT Treatment/Interventions ADLs/Self Care Home Management;Cryotherapy;Electrical Stimulation;Iontophoresis 4mg /ml Dexamethasone;Moist Heat;Traction;Balance training;Therapeutic exercise;Therapeutic activities;Functional mobility training;Stair training;Gait training;DME Instruction;Ultrasound;Neuromuscular re-education;Patient/family education;Passive range of motion;Spinal Manipulations;Joint Manipulations;Dry needling;Vasopneumatic Device;Manual techniques;Taping    PT Next Visit Plan Consider transitioning into elevation against gravity with avoiding shrug.  Returning to MD for follow up.  Inclusion of BFR for strengthening?    PT Home Exercise Plan [place] and Agree with Plan of Care Patient             Patient will benefit from skilled therapeutic intervention in order to improve the following deficits and impairments:  Decreased endurance, Hypomobility, Increased edema, Pain, Impaired UE functional use, Decreased strength, Decreased activity tolerance, Decreased mobility, Increased muscle spasms, Impaired perceived functional ability, Improper body mechanics, Impaired flexibility, Postural dysfunction, Decreased coordination, Decreased range of motion  Visit Diagnosis: Chronic right shoulder pain  Muscle weakness (generalized)  Abnormal posture  Localized edema     Problem List Patient Active Problem List   Diagnosis Date Noted   Complete tear of right rotator cuff    Subluxation of tendon of long head of biceps    Degenerative superior labral anterior-to-posterior (SLAP) tear of right shoulder     Graves disease 01/26/2019   Hyperthyroidism 12/01/2018   Vitamin D deficiency 11/07/2016   Hypokalemia 11/07/2016   Obesity, Class II, BMI 35-39.9, with comorbidity ([place]) 09/11/2015   Essential hypertension, benign 10/19/2012   Hyperlipidemia 06/18/2011   Encounter for long-term (current) use of other medications 06/18/2011   Need for prophylactic vaccination and inoculation against influenza 06/18/2011    Scot Jun, PT, DPT, OCS, ATC 01/01/21  3:47 PM    [facility] [address] [place], [place], [address] Phone: [phone]   Fax:  [phone]  Name: MALON SIDDALL MRN: [id number] Date of Birth: 14-May-1966

## 2021-01-03 ENCOUNTER — Ambulatory Visit (INDEPENDENT_AMBULATORY_CARE_PROVIDER_SITE_OTHER): Payer: BC Managed Care – PPO | Admitting: Orthopedic Surgery

## 2021-01-03 ENCOUNTER — Other Ambulatory Visit: Payer: Self-pay

## 2021-01-03 DIAGNOSIS — S46011D Strain of muscle(s) and tendon(s) of the rotator cuff of right shoulder, subsequent encounter: Secondary | ICD-10-CM

## 2021-01-04 ENCOUNTER — Ambulatory Visit (INDEPENDENT_AMBULATORY_CARE_PROVIDER_SITE_OTHER): Payer: BC Managed Care – PPO | Admitting: Rehabilitative and Restorative Service Providers"

## 2021-01-04 ENCOUNTER — Encounter: Payer: Self-pay | Admitting: Rehabilitative and Restorative Service Providers"

## 2021-01-04 ENCOUNTER — Encounter: Payer: Self-pay | Admitting: Orthopedic Surgery

## 2021-01-04 DIAGNOSIS — R600 Localized edema: Secondary | ICD-10-CM | POA: Diagnosis not present

## 2021-01-04 DIAGNOSIS — M6281 Muscle weakness (generalized): Secondary | ICD-10-CM | POA: Diagnosis not present

## 2021-01-04 DIAGNOSIS — R293 Abnormal posture: Secondary | ICD-10-CM | POA: Diagnosis not present

## 2021-01-04 DIAGNOSIS — G8929 Other chronic pain: Secondary | ICD-10-CM

## 2021-01-04 DIAGNOSIS — M25511 Pain in right shoulder: Secondary | ICD-10-CM

## 2021-01-04 NOTE — Therapy (Signed)
[facility] [address] [place], [place], [address] Phone: [phone]   Fax:  [phone]  Physical Therapy Treatment  Patient Details  Name: Sherry Huff MRN: [id number] Date of Birth: [birth year] Referring Provider (PT): Dr. Marlou Sa   Encounter Date: 01/04/2021   PT End of Session - 01/04/21 1541     Visit Number 14    Number of Visits 23    Date for PT Re-Evaluation 02/12/21    Progress Note Due on Visit 23    PT Start Time 1519    PT Stop Time 1559    PT Time Calculation (min) 40 min    Activity Tolerance Patient tolerated treatment well    Behavior During Therapy [facility] for tasks assessed/performed             Past Medical History:  Diagnosis Date   Allergy    Colon polyp 03/2017   tubular adenoma; repeat colonoscopy in 03/2022   Depression    in the past was on Prozac not presently    GERD (gastroesophageal reflux disease)    not on meds   Hyperlipidemia    Hypertension    Tobacco use disorder    quit 07/19/2014    Past Surgical History:  Procedure Laterality Date   ABDOMINAL HYSTERECTOMY  2010   partial (with bladder sling); benign   BUNIONECTOMY     left   CESAREAN SECTION     x2   GANGLION CYST EXCISION     left arm   INCONTINENCE SURGERY  2010   Dr. Philis Pique   SHOULDER ARTHROSCOPY WITH ROTATOR CUFF REPAIR AND SUBACROMIAL DECOMPRESSION Right 10/17/2020   Procedure: RIGHT SHOULDER ARTHROSCOPY, DEBRIDEMENT, BICEPS TENODESIS, ROTATOR CUFF TEAR REPAIR;  Surgeon: Meredith Pel, MD;  Location: [place];  Service: Orthopedics;  Laterality: Right;    There were no vitals filed for this visit.   Subjective Assessment - 01/04/21 1521     Subjective Soreness c movement/lifting at 2/10 or so.    Limitations Lifting;House hold activities    Patient Stated Goals Reduce pain    Currently in Pain? Yes    Pain Score 2     Pain Location Shoulder    Pain Orientation Right    Pain Descriptors / Indicators Sore     Pain Onset 1 to 4 weeks ago    Aggravating Factors  ER movement, lifting arm to side    Pain Relieving Factors rest                               [facility] Adult PT Treatment/Exercise - 01/04/21 0001       Blood Flow Restriction   Blood Flow Restriction Yes      Blood Flow Restriction-Positions    Blood Flow Restriction Position Supine      BFR-Supine   Supine Limb Occulsion Pressure (mmHg) 140    Supine Exercise Pressure (mmHg) 70    Supine Exercise Prescription Comment see exercises below      Shoulder Exercises: Supine   Flexion Right   BFR 70 mmHg 1 lb 30 x, 3 x 15   Shoulder Flexion Weight (lbs) 1      Shoulder Exercises: Sidelying   External Rotation Right   BFR 70 mmHg 30 x, 3 x 15   External Rotation Weight (lbs) 2    ABduction Right   BFR 70 mmHg 1 lb 30x, 3 x 15   ABduction  Weight (lbs) 1      Shoulder Exercises: Standing   Other Standing Exercises wall push up 2 x 10      Shoulder Exercises: ROM/Strengthening   Nustep UE only 6 mins Lvl 4      Shoulder Exercises: Stretch   Other Shoulder Stretches doorway er stretch in 70 deg abduction 30 sec x 3 Rt                    PT Education - 01/04/21 1529     Education Details BFR education    Person(s) Educated Patient    Methods Explanation    Comprehension Verbalized understanding              PT Short Term Goals - 11/30/20 1549       PT SHORT TERM GOAL #1   Title Patient will demonstrate independent use of home exercise program to maintain progress from in clinic treatments.    Baseline pt reporting compliance with HEP    Status Achieved               PT Long Term Goals - 01/01/21 1544       PT LONG TERM GOAL #1   Title Patient will demonstrate/report pain at worst less than or equal to 2/10 to facilitate minimal limitation in daily activity secondary to pain symptoms.    Time 10    Period Weeks    Status Achieved      PT LONG TERM GOAL #2   Title Patient  will demonstrate independent use of home exercise program to facilitate ability to maintain/progress functional gains from skilled physical therapy services.    Time 10    Period Weeks    Status Achieved      PT LONG TERM GOAL #3   Title Patient will demonstrate return to work/recreational activity at previous level of function without limitations secondary due to condition (FOTO > = 69)    Time 6    Status Revised    Target Date 02/12/21      PT LONG TERM GOAL #4   Title Pt. will demonstrate Rt shoulder AROM WFL s symptoms to facilitate usual reaching, dressing, self care at PLOF s limitation.    Time 10    Period Weeks    Status Achieved      PT LONG TERM GOAL #5   Title Pt. will demonstrate Rt arm MMT equal to Lt c dynamometry with 15% of Lt to indicated ability to lift, carry and perform household activity at PLOF.    Time 10    Period Weeks    Status Revised    Target Date 02/12/21                   Plan - 01/04/21 1530     Clinical Impression Statement Application of BFR today with fair to good tolerance (fatigue mainly noted, not pain reported).  Performed in supine, sidelying activity to avoid shrug and improve mechanic control.    Personal Factors and Comorbidities Comorbidity 2    Comorbidities HTN, hyperlipidemia    Examination-Activity Limitations Sleep;Carry;Dressing;Hygiene/Grooming;Lift;Reach Overhead    Examination-Participation Restrictions Occupation;Community Activity;Cleaning;Interpersonal Relationship;Laundry;Meal Prep;Yard Work    Stability/Clinical Decision Making Stable/Uncomplicated    Rehab Potential Good    PT Frequency 2x / week    PT Duration Other (comment)    PT Treatment/Interventions ADLs/Self Care Home Management;Cryotherapy;Electrical Stimulation;Iontophoresis 4mg /ml Dexamethasone;Moist Heat;Traction;Balance training;Therapeutic exercise;Therapeutic activities;Functional mobility training;Stair training;Gait training;DME  Instruction;Ultrasound;Neuromuscular re-education;Patient/family  education;Passive range of motion;Spinal Manipulations;Joint Manipulations;Dry needling;Vasopneumatic Device;Manual techniques;Taping    PT Next Visit Plan BFR if after visit tolerance is ok.    PT Home Exercise Plan [place] and Agree with Plan of Care Patient             Patient will benefit from skilled therapeutic intervention in order to improve the following deficits and impairments:  Decreased endurance, Hypomobility, Increased edema, Pain, Impaired UE functional use, Decreased strength, Decreased activity tolerance, Decreased mobility, Increased muscle spasms, Impaired perceived functional ability, Improper body mechanics, Impaired flexibility, Postural dysfunction, Decreased coordination, Decreased range of motion  Visit Diagnosis: Chronic right shoulder pain  Muscle weakness (generalized)  Abnormal posture  Localized edema     Problem List Patient Active Problem List   Diagnosis Date Noted   Complete tear of right rotator cuff    Subluxation of tendon of long head of biceps    Degenerative superior labral anterior-to-posterior (SLAP) tear of right shoulder    Graves disease 01/26/2019   Hyperthyroidism 12/01/2018   Vitamin D deficiency 11/07/2016   Hypokalemia 11/07/2016   Obesity, Class II, BMI 35-39.9, with comorbidity ([place]) 09/11/2015   Essential hypertension, benign 10/19/2012   Hyperlipidemia 06/18/2011   Encounter for long-term (current) use of other medications 06/18/2011   Need for prophylactic vaccination and inoculation against influenza 06/18/2011    Scot Jun, PT, DPT, OCS, ATC 01/04/21  3:52 PM    [facility] [address] [place], [place], [address] Phone: [phone]   Fax:  [phone]  Name: Sherry Huff MRN: [id number] Date of Birth: April 14, 1966

## 2021-01-04 NOTE — Progress Notes (Signed)
Post-Op Visit Note   Patient: Sherry Huff           Date of Birth: [birth year]           MRN: [id number] Visit Date: 01/03/2021 PCP: Rita Ohara, MD   Assessment & Plan:  Chief Complaint:  Chief Complaint  Patient presents with   Right Shoulder - Routine Post Op   Visit Diagnoses:  1. Traumatic complete tear of right rotator cuff, subsequent encounter     Plan: Patient is a 55 year old female who presents s/p right shoulder arthroscopy with biceps tenodesis and rotator cuff repair of the supraspinatus on 10/17/2020.  She reports that she is feeling better and making slow progress every day.  She is in physical therapy 2 times per week and transition to 1 time per week in early July.  She complains of continued soreness but nothing that she has to take any medication for.  She also has a "rubber band sensation" in the anterior shoulder with occasional snapping sensation.  She has about 2 and half months out.  She is able to sleep okay at night.  The soreness she experiences mostly correlates with how active she is.  On exam she has 70 degrees external rotation, 90 degrees abduction, 160 degrees forward flexion.  Incisions well-healed.  Axillary nerve intact with deltoid firing.  Full active range of motion which is equivalent to passive range of motion.  Mild anterior crepitus consistent with prior examination at last appointment.  Excellent infraspinatus strength rated 5/5.  Good supraspinatus and subscapularis strength rated 5 -/5.  Overall, patient is doing very well and plan for patient to progressively increase her lifting and continue with therapy exercises.  No heavy lifting overhead.  Follow-up in 2 months for final check with Dr. Marlou Sa.  Follow-Up Instructions: No follow-ups on file.   Orders:  No orders of the defined types were placed in this encounter.  No orders of the defined types were placed in this encounter.   Imaging: No results found.  PMFS  History: Patient Active Problem List   Diagnosis Date Noted   Complete tear of right rotator cuff    Subluxation of tendon of long head of biceps    Degenerative superior labral anterior-to-posterior (SLAP) tear of right shoulder    Graves disease 01/26/2019   Hyperthyroidism 12/01/2018   Vitamin D deficiency 11/07/2016   Hypokalemia 11/07/2016   Obesity, Class II, BMI 35-39.9, with comorbidity ([place]) 09/11/2015   Essential hypertension, benign 10/19/2012   Hyperlipidemia 06/18/2011   Encounter for long-term (current) use of other medications 06/18/2011   Need for prophylactic vaccination and inoculation against influenza 06/18/2011   Past Medical History:  Diagnosis Date   Allergy    Colon polyp 03/2017   tubular adenoma; repeat colonoscopy in 03/2022   Depression    in the past was on Prozac not presently    GERD (gastroesophageal reflux disease)    not on meds   Hyperlipidemia    Hypertension    Tobacco use disorder    quit 07/19/2014    Family History  Problem Relation Age of Onset   Cancer Mother        died of leukemia   Cancer Father        died of lung cancer   Hypertension Father    Depression Sister        bipolar   Dementia Sister        related to self-inflected gunshot wound to head  Hypertension Brother    Hyperlipidemia Brother    Heart disease Brother        CABG at 35   Stroke Paternal Aunt    Heart disease Paternal Uncle    COPD Paternal Uncle    Diabetes Maternal Aunt    Cancer Paternal Grandmother        breast cancer   Breast cancer Paternal Grandmother        in her 26's   Cancer Brother 12       bladder   Colon polyps Neg Hx    Colon cancer Neg Hx    Esophageal cancer Neg Hx    Rectal cancer Neg Hx    Stomach cancer Neg Hx     Past Surgical History:  Procedure Laterality Date   ABDOMINAL HYSTERECTOMY  2010   partial (with bladder sling); benign   BUNIONECTOMY     left   CESAREAN SECTION     x2   GANGLION CYST EXCISION      left arm   INCONTINENCE SURGERY  2010   Dr. Philis Pique   SHOULDER ARTHROSCOPY WITH ROTATOR CUFF REPAIR AND SUBACROMIAL DECOMPRESSION Right 10/17/2020   Procedure: RIGHT SHOULDER ARTHROSCOPY, DEBRIDEMENT, BICEPS TENODESIS, ROTATOR CUFF TEAR REPAIR;  Surgeon: Meredith Pel, MD;  Location: [place];  Service: Orthopedics;  Laterality: Right;   Social History   Occupational History   Occupation: Chiropodist: [employer]  Tobacco Use   Smoking status: Former    Packs/day: 0.50    Years: 10.00    Pack years: 5.00    Types: Cigarettes    Quit date: 07/19/2014    Years since quitting: 6.4   Smokeless tobacco: Never  Vaping Use   Vaping Use: Never used  Substance and Sexual Activity   Alcohol use: Yes    Alcohol/week: 0.0 standard drinks    Comment: 1-2 drinks a week on average   Drug use: No   Sexual activity: Yes    Partners: Male    Birth control/protection: Surgical

## 2021-01-07 ENCOUNTER — Other Ambulatory Visit: Payer: Self-pay | Admitting: Family Medicine

## 2021-01-07 DIAGNOSIS — M25511 Pain in right shoulder: Secondary | ICD-10-CM

## 2021-01-08 ENCOUNTER — Other Ambulatory Visit: Payer: Self-pay

## 2021-01-08 ENCOUNTER — Encounter: Payer: Self-pay | Admitting: Rehabilitative and Restorative Service Providers"

## 2021-01-08 ENCOUNTER — Ambulatory Visit (INDEPENDENT_AMBULATORY_CARE_PROVIDER_SITE_OTHER): Payer: BC Managed Care – PPO | Admitting: Rehabilitative and Restorative Service Providers"

## 2021-01-08 DIAGNOSIS — R600 Localized edema: Secondary | ICD-10-CM | POA: Diagnosis not present

## 2021-01-08 DIAGNOSIS — M6281 Muscle weakness (generalized): Secondary | ICD-10-CM

## 2021-01-08 DIAGNOSIS — G8929 Other chronic pain: Secondary | ICD-10-CM

## 2021-01-08 DIAGNOSIS — M25511 Pain in right shoulder: Secondary | ICD-10-CM

## 2021-01-08 DIAGNOSIS — R293 Abnormal posture: Secondary | ICD-10-CM

## 2021-01-08 NOTE — Therapy (Signed)
[facility] [address] [place], [place], [address] Phone: [phone]   Fax:  [phone]  Physical Therapy Treatment  Patient Details  Name: Sherry Huff MRN: [id number] Date of Birth: 02/07/1966 Referring Provider (PT): Dr. Marlou Sa   Encounter Date: 01/08/2021   PT End of Session - 01/08/21 1521     Visit Number 15    Number of Visits 23    Date for PT Re-Evaluation 02/12/21    Progress Note Due on Visit 23    PT Start Time 1517    PT Stop Time 0272    PT Time Calculation (min) 39 min    Activity Tolerance Patient tolerated treatment well    Behavior During Therapy [facility] for tasks assessed/performed             Past Medical History:  Diagnosis Date   Allergy    Colon polyp 03/2017   tubular adenoma; repeat colonoscopy in 03/2022   Depression    in the past was on Prozac not presently    GERD (gastroesophageal reflux disease)    not on meds   Hyperlipidemia    Hypertension    Tobacco use disorder    quit 07/19/2014    Past Surgical History:  Procedure Laterality Date   ABDOMINAL HYSTERECTOMY  2010   partial (with bladder sling); benign   BUNIONECTOMY     left   CESAREAN SECTION     x2   GANGLION CYST EXCISION     left arm   INCONTINENCE SURGERY  2010   Dr. Philis Pique   SHOULDER ARTHROSCOPY WITH ROTATOR CUFF REPAIR AND SUBACROMIAL DECOMPRESSION Right 10/17/2020   Procedure: RIGHT SHOULDER ARTHROSCOPY, DEBRIDEMENT, BICEPS TENODESIS, ROTATOR CUFF TEAR REPAIR;  Surgeon: Meredith Pel, MD;  Location: [place];  Service: Orthopedics;  Laterality: Right;    There were no vitals filed for this visit.   Subjective Assessment - 01/08/21 1521     Subjective Pt. stated feeling tired after last visit but didn't report severe soreness and reported no pain upon arrival at rest.    Limitations Lifting;House hold activities    Patient Stated Goals Reduce pain    Currently in Pain? No/denies    Pain Score 0-No pain    Pain  Onset 1 to 4 weeks ago                               [facility] Adult PT Treatment/Exercise - 01/08/21 0001       BFR-Supine   Supine Limb Occulsion Pressure (mmHg) 140    Supine Exercise Pressure (mmHg) 70      Shoulder Exercises: Supine   Horizontal ABduction Both   3 x 15   Theraband Level (Shoulder Horizontal ABduction) Level 3 (Green)    Flexion Right   BFR 70 mmHg 2 lbs 30x, 3 x 15   Shoulder Flexion Weight (lbs) 2    Other Supine Exercises supine cw, ccw circles in 90 deg flexion 3 lbs 2 x 30 each way      Shoulder Exercises: Sidelying   ABduction Right   BFR 70 mmHg, 30x, 3 x 15   ABduction Weight (lbs) 2      Shoulder Exercises: Standing   External Rotation Right   3 x 10 c towel at side slow eccentric focus   Theraband Level (Shoulder External Rotation) Level 3 (Green)    Other Standing Exercises UE ranger flexion 1 lb 2  x 15 c ipsilateral leg step in during elevation      Shoulder Exercises: ROM/Strengthening   UBE (Upper Arm Bike) Lvl 3 3 mins fwd/back each way c BFR 75 mmHg Rt arm                      PT Short Term Goals - 11/30/20 1549       PT SHORT TERM GOAL #1   Title Patient will demonstrate independent use of home exercise program to maintain progress from in clinic treatments.    Baseline pt reporting compliance with HEP    Status Achieved               PT Long Term Goals - 01/01/21 1544       PT LONG TERM GOAL #1   Title Patient will demonstrate/report pain at worst less than or equal to 2/10 to facilitate minimal limitation in daily activity secondary to pain symptoms.    Time 10    Period Weeks    Status Achieved      PT LONG TERM GOAL #2   Title Patient will demonstrate independent use of home exercise program to facilitate ability to maintain/progress functional gains from skilled physical therapy services.    Time 10    Period Weeks    Status Achieved      PT LONG TERM GOAL #3   Title Patient will  demonstrate return to work/recreational activity at previous level of function without limitations secondary due to condition (FOTO > = 69)    Time 6    Status Revised    Target Date 02/12/21      PT LONG TERM GOAL #4   Title Pt. will demonstrate Rt shoulder AROM WFL s symptoms to facilitate usual reaching, dressing, self care at PLOF s limitation.    Time 10    Period Weeks    Status Achieved      PT LONG TERM GOAL #5   Title Pt. will demonstrate Rt arm MMT equal to Lt c dynamometry with 15% of Lt to indicated ability to lift, carry and perform household activity at PLOF.    Time 10    Period Weeks    Status Revised    Target Date 02/12/21                   Plan - 01/08/21 1545     Clinical Impression Statement Progression noted in BFR resistance training in gravity reduced positioning.  Movement against gravity in flexion trial performed to approx. 70-75 degrees prior to onset of shrug noted.    Personal Factors and Comorbidities Comorbidity 2    Comorbidities HTN, hyperlipidemia    Examination-Activity Limitations Sleep;Carry;Dressing;Hygiene/Grooming;Lift;Reach Overhead    Examination-Participation Restrictions Occupation;Community Activity;Cleaning;Interpersonal Relationship;Laundry;Meal Prep;Yard Work    Stability/Clinical Decision Making Stable/Uncomplicated    Rehab Potential Good    PT Frequency 2x / week    PT Duration Other (comment)    PT Treatment/Interventions ADLs/Self Care Home Management;Cryotherapy;Electrical Stimulation;Iontophoresis 4mg /ml Dexamethasone;Moist Heat;Traction;Balance training;Therapeutic exercise;Therapeutic activities;Functional mobility training;Stair training;Gait training;DME Instruction;Ultrasound;Neuromuscular re-education;Patient/family education;Passive range of motion;Spinal Manipulations;Joint Manipulations;Dry needling;Vasopneumatic Device;Manual techniques;Taping    PT Next Visit Plan BFR in sidelying, supine activity.  Hold  gravity resisted intervention if shrug    PT Home Exercise Plan [place] and Agree with Plan of Care Patient             Patient will benefit from skilled therapeutic intervention in order to  improve the following deficits and impairments:  Decreased endurance, Hypomobility, Increased edema, Pain, Impaired UE functional use, Decreased strength, Decreased activity tolerance, Decreased mobility, Increased muscle spasms, Impaired perceived functional ability, Improper body mechanics, Impaired flexibility, Postural dysfunction, Decreased coordination, Decreased range of motion  Visit Diagnosis: Chronic right shoulder pain  Muscle weakness (generalized)  Abnormal posture  Localized edema     Problem List Patient Active Problem List   Diagnosis Date Noted   Complete tear of right rotator cuff    Subluxation of tendon of long head of biceps    Degenerative superior labral anterior-to-posterior (SLAP) tear of right shoulder    Graves disease 01/26/2019   Hyperthyroidism 12/01/2018   Vitamin D deficiency 11/07/2016   Hypokalemia 11/07/2016   Obesity, Class II, BMI 35-39.9, with comorbidity ([place]) 09/11/2015   Essential hypertension, benign 10/19/2012   Hyperlipidemia 06/18/2011   Encounter for long-term (current) use of other medications 06/18/2011   Need for prophylactic vaccination and inoculation against influenza 06/18/2011    Scot Jun, PT, DPT, OCS, ATC 01/08/21  3:52 PM    [facility] [address] [place], [place], [address] Phone: [phone]   Fax:  [phone]  Name: Sherry Huff MRN: [id number] Date of Birth: 11/15/1965

## 2021-01-11 ENCOUNTER — Encounter: Payer: Self-pay | Admitting: Rehabilitative and Restorative Service Providers"

## 2021-01-11 ENCOUNTER — Other Ambulatory Visit: Payer: Self-pay

## 2021-01-11 ENCOUNTER — Ambulatory Visit (INDEPENDENT_AMBULATORY_CARE_PROVIDER_SITE_OTHER): Payer: BC Managed Care – PPO | Admitting: Rehabilitative and Restorative Service Providers"

## 2021-01-11 DIAGNOSIS — G8929 Other chronic pain: Secondary | ICD-10-CM

## 2021-01-11 DIAGNOSIS — R293 Abnormal posture: Secondary | ICD-10-CM

## 2021-01-11 DIAGNOSIS — M6281 Muscle weakness (generalized): Secondary | ICD-10-CM

## 2021-01-11 DIAGNOSIS — M25511 Pain in right shoulder: Secondary | ICD-10-CM | POA: Diagnosis not present

## 2021-01-11 DIAGNOSIS — R600 Localized edema: Secondary | ICD-10-CM

## 2021-01-11 NOTE — Therapy (Signed)
[facility] Physical Therapy [address] [place], [place], [address] Phone: [phone]   Fax:  [phone]  Physical Therapy Treatment  Patient Details  Name: Sherry Huff MRN: [id number] Date of Birth: September 05, 1965 Referring Provider (PT): Dr. Marlou Sa   Encounter Date: 01/11/2021   PT End of Session - 01/11/21 1524     Visit Number 16    Number of Visits 23    Date for PT Re-Evaluation 02/12/21    Progress Note Due on Visit 23    PT Start Time 1514    PT Stop Time 1275    PT Time Calculation (min) 40 min    Activity Tolerance Patient tolerated treatment well    Behavior During Therapy [facility] for tasks assessed/performed             Past Medical History:  Diagnosis Date   Allergy    Colon polyp 03/2017   tubular adenoma; repeat colonoscopy in 03/2022   Depression    in the past was on Prozac not presently    GERD (gastroesophageal reflux disease)    not on meds   Hyperlipidemia    Hypertension    Tobacco use disorder    quit 07/19/2014    Past Surgical History:  Procedure Laterality Date   ABDOMINAL HYSTERECTOMY  2010   partial (with bladder sling); benign   BUNIONECTOMY     left   CESAREAN SECTION     x2   GANGLION CYST EXCISION     left arm   INCONTINENCE SURGERY  2010   Dr. Philis Pique   SHOULDER ARTHROSCOPY WITH ROTATOR CUFF REPAIR AND SUBACROMIAL DECOMPRESSION Right 10/17/2020   Procedure: RIGHT SHOULDER ARTHROSCOPY, DEBRIDEMENT, BICEPS TENODESIS, ROTATOR CUFF TEAR REPAIR;  Surgeon: Meredith Pel, MD;  Location: [place];  Service: Orthopedics;  Laterality: Right;    There were no vitals filed for this visit.   Subjective Assessment - 01/11/21 1522     Subjective Pt. indicated similar variable soreness in Rt shoulder.  Pt. indicated sometimes reaching, exercises are easier and sometimes harder to do.    Limitations Lifting;House hold activities    Patient Stated Goals Reduce pain    Currently in Pain? No/denies   soreness  indicated, not pain   Pain Score 0-No pain    Pain Location Shoulder    Pain Orientation Right    Pain Descriptors / Indicators Sore    Pain Type Surgical pain    Pain Onset More than a month ago    Pain Frequency Intermittent    Aggravating Factors  variable reaching, lifting    Pain Relieving Factors rest sometimes helps                [facility] PT Assessment - 01/11/21 0001       Special Tests   Other special tests + Scapular assist test on Rt for elevation improvements                           [facility] Adult PT Treatment/Exercise - 01/11/21 0001       Shoulder Exercises: Supine   Protraction Right;15 reps   5 sec hold   Protraction Weight (lbs) 5    Horizontal ABduction Both    Theraband Level (Shoulder Horizontal ABduction) Level 3 (Green)   3 x 15   Other Supine Exercises supine cw, ccw circles in 90 deg flexion 3 lbs 2 x 30 each way      Shoulder  Exercises: Prone   Other Prone Exercises prone y, t 3 x 10 Rt arm (added to home)    Other Prone Exercises prone row 3 x 10 3 lbs Rt      Shoulder Exercises: Sidelying   External Rotation Right   x 15 , x 10, x15   External Rotation Weight (lbs) 3      Shoulder Exercises: ROM/Strengthening   UBE (Upper Arm Bike) Lvl 3.5 4 mins fwd/back each way      Shoulder Exercises: Stretch   Other Shoulder Stretches doorway er stretch in 70 deg abduction 30 sec x 5 Rt                      PT Short Term Goals - 11/30/20 1549       PT SHORT TERM GOAL #1   Title Patient will demonstrate independent use of home exercise program to maintain progress from in clinic treatments.    Baseline pt reporting compliance with HEP    Status Achieved               PT Long Term Goals - 01/01/21 1544       PT LONG TERM GOAL #1   Title Patient will demonstrate/report pain at worst less than or equal to 2/10 to facilitate minimal limitation in daily activity secondary to pain symptoms.    Time 10    Period  Weeks    Status Achieved      PT LONG TERM GOAL #2   Title Patient will demonstrate independent use of home exercise program to facilitate ability to maintain/progress functional gains from skilled physical therapy services.    Time 10    Period Weeks    Status Achieved      PT LONG TERM GOAL #3   Title Patient will demonstrate return to work/recreational activity at previous level of function without limitations secondary due to condition (FOTO > = 69)    Time 6    Status Revised    Target Date 02/12/21      PT LONG TERM GOAL #4   Title Pt. will demonstrate Rt shoulder AROM WFL s symptoms to facilitate usual reaching, dressing, self care at PLOF s limitation.    Time 10    Period Weeks    Status Achieved      PT LONG TERM GOAL #5   Title Pt. will demonstrate Rt arm MMT equal to Lt c dynamometry with 15% of Lt to indicated ability to lift, carry and perform household activity at PLOF.    Time 10    Period Weeks    Status Revised    Target Date 02/12/21                   Plan - 01/11/21 1536     Clinical Impression Statement Break given today on use of BFR secondary to general soreness complaints.  Continued lower trap/middle trap activation/strength improvements to help improve scapular upward rotations in elevation attempts with fair performance at best(partial limitations from mobility and strength).    Personal Factors and Comorbidities Comorbidity 2    Comorbidities HTN, hyperlipidemia    Examination-Activity Limitations Sleep;Carry;Dressing;Hygiene/Grooming;Lift;Reach Overhead    Examination-Participation Restrictions Occupation;Community Activity;Cleaning;Interpersonal Relationship;Laundry;Meal Prep;Yard Work    Stability/Clinical Decision Making Stable/Uncomplicated    Rehab Potential Good    PT Frequency 2x / week    PT Duration Other (comment)    PT Treatment/Interventions ADLs/Self Care Home Management;Cryotherapy;Electrical Stimulation;Iontophoresis 4mg /ml  Dexamethasone;Moist Heat;Traction;Balance training;Therapeutic exercise;Therapeutic activities;Functional mobility training;Stair training;Gait training;DME Instruction;Ultrasound;Neuromuscular re-education;Patient/family education;Passive range of motion;Spinal Manipulations;Joint Manipulations;Dry needling;Vasopneumatic Device;Manual techniques;Taping    PT Next Visit Plan BFR in sidelying, supine activity, prone y, t progression for strength.  Hold gravity resisted intervention if shrug    PT Home Exercise Plan [place] and Agree with Plan of Care Patient             Patient will benefit from skilled therapeutic intervention in order to improve the following deficits and impairments:  Decreased endurance, Hypomobility, Increased edema, Pain, Impaired UE functional use, Decreased strength, Decreased activity tolerance, Decreased mobility, Increased muscle spasms, Impaired perceived functional ability, Improper body mechanics, Impaired flexibility, Postural dysfunction, Decreased coordination, Decreased range of motion  Visit Diagnosis: Chronic right shoulder pain  Muscle weakness (generalized)  Abnormal posture  Localized edema     Problem List Patient Active Problem List   Diagnosis Date Noted   Complete tear of right rotator cuff    Subluxation of tendon of long head of biceps    Degenerative superior labral anterior-to-posterior (SLAP) tear of right shoulder    Graves disease 01/26/2019   Hyperthyroidism 12/01/2018   Vitamin D deficiency 11/07/2016   Hypokalemia 11/07/2016   Obesity, Class II, BMI 35-39.9, with comorbidity ([place]) 09/11/2015   Essential hypertension, benign 10/19/2012   Hyperlipidemia 06/18/2011   Encounter for long-term (current) use of other medications 06/18/2011   Need for prophylactic vaccination and inoculation against influenza 06/18/2011   Scot Jun, PT, DPT, OCS, ATC 01/11/21  3:53 PM    [facility] [address] [place], [place], [address] Phone: [phone]   Fax:  [phone]  Name: Sherry Huff MRN: [id number] Date of Birth: [birth year]

## 2021-01-15 ENCOUNTER — Other Ambulatory Visit: Payer: Self-pay

## 2021-01-15 ENCOUNTER — Ambulatory Visit (INDEPENDENT_AMBULATORY_CARE_PROVIDER_SITE_OTHER): Payer: BC Managed Care – PPO | Admitting: Rehabilitative and Restorative Service Providers"

## 2021-01-15 ENCOUNTER — Encounter: Payer: Self-pay | Admitting: Rehabilitative and Restorative Service Providers"

## 2021-01-15 DIAGNOSIS — M6281 Muscle weakness (generalized): Secondary | ICD-10-CM

## 2021-01-15 DIAGNOSIS — R600 Localized edema: Secondary | ICD-10-CM | POA: Diagnosis not present

## 2021-01-15 DIAGNOSIS — M25511 Pain in right shoulder: Secondary | ICD-10-CM | POA: Diagnosis not present

## 2021-01-15 DIAGNOSIS — R293 Abnormal posture: Secondary | ICD-10-CM | POA: Diagnosis not present

## 2021-01-15 DIAGNOSIS — G8929 Other chronic pain: Secondary | ICD-10-CM

## 2021-01-15 NOTE — Therapy (Addendum)
[facility] Therapy [address] [place], [place], [address] Phone: [phone]   Fax:  [phone]  Physical Therapy Treatment/Discharge  Patient Details  Name: Sherry Huff MRN: [id number] Date of Birth: 09/03/1965 Referring Provider (PT): Dr. Marlou Sa   Encounter Date: 01/15/2021   PT End of Session - 01/15/21 1503     Visit Number 17    Number of Visits 23    Date for PT Re-Evaluation 02/12/21    Progress Note Due on Visit 23    PT Start Time 1505    PT Stop Time 8676    PT Time Calculation (min) 50 min    Activity Tolerance Patient tolerated treatment well    Behavior During Therapy [facility] for tasks assessed/performed             Past Medical History:  Diagnosis Date   Allergy    Colon polyp 03/2017   tubular adenoma; repeat colonoscopy in 03/2022   Depression    in the past was on Prozac not presently    GERD (gastroesophageal reflux disease)    not on meds   Hyperlipidemia    Hypertension    Tobacco use disorder    quit 07/19/2014    Past Surgical History:  Procedure Laterality Date   ABDOMINAL HYSTERECTOMY  2010   partial (with bladder sling); benign   BUNIONECTOMY     left   CESAREAN SECTION     x2   GANGLION CYST EXCISION     left arm   INCONTINENCE SURGERY  2010   Dr. Philis Pique   SHOULDER ARTHROSCOPY WITH ROTATOR CUFF REPAIR AND SUBACROMIAL DECOMPRESSION Right 10/17/2020   Procedure: RIGHT SHOULDER ARTHROSCOPY, DEBRIDEMENT, BICEPS TENODESIS, ROTATOR CUFF TEAR REPAIR;  Surgeon: Meredith Pel, MD;  Location: [place];  Service: Orthopedics;  Laterality: Right;    There were no vitals filed for this visit.   Subjective Assessment - 01/15/21 1508     Subjective Pt. indicated maybe a little progress of exercise difficulty at home.  Similar overall soreness noted at times.    Limitations Lifting;House hold activities    Patient Stated Goals Reduce pain    Currently in Pain? No/denies   no pain, sore   Pain Onset  More than a month ago                               [facility] Adult PT Treatment/Exercise - 01/15/21 0001       BFR-Supine   Supine Exercise Pressure (mmHg) 80      Shoulder Exercises: Prone   Other Prone Exercises prone y, t 3 x 15 1 lb Rt arm    Other Prone Exercises prone row 3 x 15 4 lbs Rt      Shoulder Exercises: Sidelying   External Rotation Right;Other (comment)   BFR 80 mmHg 25x, 3 x 15   External Rotation Weight (lbs) 3    Flexion Right   3 x 10 1 lb   Flexion Weight (lbs) 1    ABduction Right   BFR 30x, 3 x 15 80 mmHg   ABduction Weight (lbs) 2      Shoulder Exercises: ROM/Strengthening   UBE (Upper Arm Bike) Lvl 3.5 3 mins fwd/back each way      Modalities   Modalities Vasopneumatic      Vasopneumatic   Number Minutes Vasopneumatic  10 minutes    Vasopnuematic Location  Shoulder   Rt  Vasopneumatic Pressure Medium    Vasopneumatic Temperature  34                      PT Short Term Goals - 11/30/20 1549       PT SHORT TERM GOAL #1   Title Patient will demonstrate independent use of home exercise program to maintain progress from in clinic treatments.    Baseline pt reporting compliance with HEP    Status Achieved               PT Long Term Goals - 01/01/21 1544       PT LONG TERM GOAL #1   Title Patient will demonstrate/report pain at worst less than or equal to 2/10 to facilitate minimal limitation in daily activity secondary to pain symptoms.    Time 10    Period Weeks    Status Achieved      PT LONG TERM GOAL #2   Title Patient will demonstrate independent use of home exercise program to facilitate ability to maintain/progress functional gains from skilled physical therapy services.    Time 10    Period Weeks    Status Achieved      PT LONG TERM GOAL #3   Title Patient will demonstrate return to work/recreational activity at previous level of function without limitations secondary due to condition (FOTO > =  69)    Time 6    Status Revised    Target Date 02/12/21      PT LONG TERM GOAL #4   Title Pt. will demonstrate Rt shoulder AROM WFL s symptoms to facilitate usual reaching, dressing, self care at PLOF s limitation.    Time 10    Period Weeks    Status Achieved      PT LONG TERM GOAL #5   Title Pt. will demonstrate Rt arm MMT equal to Lt c dynamometry with 15% of Lt to indicated ability to lift, carry and perform household activity at PLOF.    Time 10    Period Weeks    Status Revised    Target Date 02/12/21                   Plan - 01/15/21 1541     Clinical Impression Statement Return to inclusion of BFR intervention as well as continued scapular strengthening c some increased resistance noted.  Elevation attempts against gravity still impaired at this time so continued gravity reduced strengthening indicated.    Personal Factors and Comorbidities Comorbidity 2    Comorbidities HTN, hyperlipidemia    Examination-Activity Limitations Sleep;Carry;Dressing;Hygiene/Grooming;Lift;Reach Overhead    Examination-Participation Restrictions Occupation;Community Activity;Cleaning;Interpersonal Relationship;Laundry;Meal Prep;Yard Work    Stability/Clinical Decision Making Stable/Uncomplicated    Rehab Potential Good    PT Frequency 2x / week    PT Duration Other (comment)    PT Treatment/Interventions ADLs/Self Care Home Management;Cryotherapy;Electrical Stimulation;Iontophoresis 70m/ml Dexamethasone;Moist Heat;Traction;Balance training;Therapeutic exercise;Therapeutic activities;Functional mobility training;Stair training;Gait training;DME Instruction;Ultrasound;Neuromuscular re-education;Patient/family education;Passive range of motion;Spinal Manipulations;Joint Manipulations;Dry needling;Vasopneumatic Device;Manual techniques;Taping    PT Next Visit Plan BFR in sidelying, supine activity, prone y, t for strength.  Hold gravity resisted intervention if shrug.  [place] if desired    PT  [place] Agree with Plan of Care Patient             Patient will benefit from skilled therapeutic intervention in order to improve the following deficits and impairments:  Decreased endurance, Hypomobility, Increased edema,  Pain, Impaired UE functional use, Decreased strength, Decreased activity tolerance, Decreased mobility, Increased muscle spasms, Impaired perceived functional ability, Improper body mechanics, Impaired flexibility, Postural dysfunction, Decreased coordination, Decreased range of motion  Visit Diagnosis: Chronic right shoulder pain  Muscle weakness (generalized)  Abnormal posture  Localized edema     Problem List Patient Active Problem List   Diagnosis Date Noted   Complete tear of right rotator cuff    Subluxation of tendon of long head of biceps    Degenerative superior labral anterior-to-posterior (SLAP) tear of right shoulder    Graves disease 01/26/2019   Hyperthyroidism 12/01/2018   Vitamin D deficiency 11/07/2016   Hypokalemia 11/07/2016   Obesity, Class II, BMI 35-39.9, with comorbidity ([place]) 09/11/2015   Essential hypertension, benign 10/19/2012   Hyperlipidemia 06/18/2011   Encounter for long-term (current) use of other medications 06/18/2011   Need for prophylactic vaccination and inoculation against influenza 06/18/2011    Scot Jun, PT, DPT, OCS, ATC 01/15/21  3:46 PM  PHYSICAL THERAPY DISCHARGE SUMMARY  Visits from Start of Care: 17  Current functional level related to goals / functional outcomes: See note   Remaining deficits: See note   Education / Equipment: HEP   Patient agrees to discharge. Patient goals were partially met. Patient is being discharged due to not returning since the last visit.  Scot Jun, PT, DPT, OCS, ATC 02/19/21  9:40 AM     [facility] [address] [place], [place], [address] Phone: [phone]   Fax:   [phone]  Name: LIZZIE AN MRN: [id number] Date of Birth: 07/02/1966

## 2021-01-15 NOTE — Patient Instructions (Signed)
Access Code: [email or web address] URL: [email or web address] Date: 01/15/2021 Prepared by: Scot Jun  Exercises Seated Scapular Retraction - 2 x daily - 7 x weekly - 1 sets - 10 reps - 5 hold Sidelying Shoulder Abduction Palm Forward - 1 x daily - 7 x weekly - 3 sets - 10-15 reps Sidelying Shoulder External Rotation (Mirrored) - 1 x daily - 7 x weekly - 3 sets - 10-15 reps Shoulder External Rotation Reactive Isometrics (Mirrored) - 1 x daily - 7 x weekly - 1 sets - 15 reps - 5 hold Shoulder Internal Rotation with Resistance - 1 x daily - 7 x weekly - 3 sets - 10 reps Standing Bilateral Shoulder Scaption Wall Slide - 2 x daily - 7 x weekly - 1 sets - 10-15 reps Prone Single Arm Shoulder Y - 1 x daily - 7 x weekly - 3 sets - 10 reps Prone Shoulder Horizontal Abduction - 1 x daily - 7 x weekly - 3 sets - 10 reps Single Arm Doorway Pec Stretch at 90 Degrees Abduction - 2 x daily - 7 x weekly - 1 sets - 5 reps - 30 hold Sidelying Shoulder Flexion 15 Degrees (Mirrored) - 1 x daily - 7 x weekly - 3 sets - 10 reps

## 2021-01-18 ENCOUNTER — Encounter: Payer: BC Managed Care – PPO | Admitting: Rehabilitative and Restorative Service Providers"

## 2021-02-01 ENCOUNTER — Encounter: Payer: BC Managed Care – PPO | Admitting: Rehabilitative and Restorative Service Providers"

## 2021-02-08 ENCOUNTER — Encounter: Payer: BC Managed Care – PPO | Admitting: Rehabilitative and Restorative Service Providers"

## 2021-02-15 ENCOUNTER — Encounter: Payer: BC Managed Care – PPO | Admitting: Rehabilitative and Restorative Service Providers"

## 2021-02-24 ENCOUNTER — Other Ambulatory Visit: Payer: Self-pay | Admitting: Family Medicine

## 2021-02-24 DIAGNOSIS — E782 Mixed hyperlipidemia: Secondary | ICD-10-CM

## 2021-03-05 ENCOUNTER — Other Ambulatory Visit: Payer: Self-pay

## 2021-03-05 ENCOUNTER — Ambulatory Visit (INDEPENDENT_AMBULATORY_CARE_PROVIDER_SITE_OTHER): Payer: BC Managed Care – PPO | Admitting: Orthopedic Surgery

## 2021-03-05 DIAGNOSIS — S46011D Strain of muscle(s) and tendon(s) of the rotator cuff of right shoulder, subsequent encounter: Secondary | ICD-10-CM

## 2021-03-09 ENCOUNTER — Encounter: Payer: Self-pay | Admitting: Orthopedic Surgery

## 2021-03-09 NOTE — Progress Notes (Signed)
Post-Op Visit Note   Patient: Sherry Huff           Date of Birth: [birth year]           MRN: [id number] Visit Date: 03/05/2021 PCP: Rita Ohara, MD   Assessment & Plan:  Chief Complaint:  Chief Complaint  Patient presents with   Right Shoulder - Follow-up    10/17/20 right shoulder scope with RCR   Visit Diagnoses:  1. Traumatic complete tear of right rotator cuff, subsequent encounter     Plan: Sherry Huff is a 55 year old patient who is now 2 months out right shoulder arthroscopy with biceps tenodesis and rotator cuff repair of the supraspinatus.  Her subscapularis was out and on fixable.  Reports some popping.  No therapy in a month due to work.  She does computer type work.  On exam she does have forward flexion and AB duction above shoulder level.  Subscap is predictably weak.  Supraspinatus strength is improving but is not quite there yet.  I think she would do well to continue with a home exercise program of stretching and strengthening.  She needs to avoid heavy lifting with that right arm particularly out in front of her body just to protect that supraspinatus repair.  Follow-up as needed.  Follow-Up Instructions: Return if symptoms worsen or fail to improve.   Orders:  No orders of the defined types were placed in this encounter.  No orders of the defined types were placed in this encounter.   Imaging: No results found.  PMFS History: Patient Active Problem List   Diagnosis Date Noted   Complete tear of right rotator cuff    Subluxation of tendon of long head of biceps    Degenerative superior labral anterior-to-posterior (SLAP) tear of right shoulder    Graves disease 01/26/2019   Hyperthyroidism 12/01/2018   Vitamin D deficiency 11/07/2016   Hypokalemia 11/07/2016   Obesity, Class II, BMI 35-39.9, with comorbidity ([place]) 09/11/2015   Essential hypertension, benign 10/19/2012   Hyperlipidemia 06/18/2011   Encounter for long-term (current) use of other  medications 06/18/2011   Need for prophylactic vaccination and inoculation against influenza 06/18/2011   Past Medical History:  Diagnosis Date   Allergy    Colon polyp 03/2017   tubular adenoma; repeat colonoscopy in 03/2022   Depression    in the past was on Prozac not presently    GERD (gastroesophageal reflux disease)    not on meds   Hyperlipidemia    Hypertension    Tobacco use disorder    quit 07/19/2014    Family History  Problem Relation Age of Onset   Cancer Mother        died of leukemia   Cancer Father        died of lung cancer   Hypertension Father    Depression Sister        bipolar   Dementia Sister        related to self-inflected gunshot wound to head   Hypertension Brother    Hyperlipidemia Brother    Heart disease Brother        CABG at 41   Stroke Paternal Aunt    Heart disease Paternal Uncle    COPD Paternal Uncle    Diabetes Maternal Aunt    Cancer Paternal Grandmother        breast cancer   Breast cancer Paternal Grandmother        in her 63's   Cancer Brother  62       bladder   Colon polyps Neg Hx    Colon cancer Neg Hx    Esophageal cancer Neg Hx    Rectal cancer Neg Hx    Stomach cancer Neg Hx     Past Surgical History:  Procedure Laterality Date   ABDOMINAL HYSTERECTOMY  2010   partial (with bladder sling); benign   BUNIONECTOMY     left   CESAREAN SECTION     x2   GANGLION CYST EXCISION     left arm   INCONTINENCE SURGERY  2010   Dr. Philis Pique   SHOULDER ARTHROSCOPY WITH ROTATOR CUFF REPAIR AND SUBACROMIAL DECOMPRESSION Right 10/17/2020   Procedure: RIGHT SHOULDER ARTHROSCOPY, DEBRIDEMENT, BICEPS TENODESIS, ROTATOR CUFF TEAR REPAIR;  Surgeon: Meredith Pel, MD;  Location: [place];  Service: Orthopedics;  Laterality: Right;   Social History   Occupational History   Occupation: Chiropodist: [employer]  Tobacco Use   Smoking status: Former    Packs/day: 0.50    Years: 10.00    Pack years: 5.00     Types: Cigarettes    Quit date: 07/19/2014    Years since quitting: 6.6   Smokeless tobacco: Never  Vaping Use   Vaping Use: Never used  Substance and Sexual Activity   Alcohol use: Yes    Alcohol/week: 0.0 standard drinks    Comment: 1-2 drinks a week on average   Drug use: No   Sexual activity: Yes    Partners: Male    Birth control/protection: Surgical

## 2021-04-03 ENCOUNTER — Other Ambulatory Visit: Payer: Self-pay | Admitting: Family Medicine

## 2021-04-03 DIAGNOSIS — E782 Mixed hyperlipidemia: Secondary | ICD-10-CM

## 2021-04-18 ENCOUNTER — Encounter: Payer: Self-pay | Admitting: *Deleted

## 2021-06-05 ENCOUNTER — Other Ambulatory Visit: Payer: Self-pay | Admitting: Family Medicine

## 2021-06-05 DIAGNOSIS — E782 Mixed hyperlipidemia: Secondary | ICD-10-CM

## 2021-06-18 ENCOUNTER — Other Ambulatory Visit: Payer: Self-pay | Admitting: Family Medicine

## 2021-06-18 DIAGNOSIS — Z1231 Encounter for screening mammogram for malignant neoplasm of breast: Secondary | ICD-10-CM

## 2021-06-30 ENCOUNTER — Other Ambulatory Visit: Payer: Self-pay | Admitting: Family Medicine

## 2021-06-30 DIAGNOSIS — E782 Mixed hyperlipidemia: Secondary | ICD-10-CM

## 2021-07-01 NOTE — Progress Notes (Signed)
Chief Complaint  Patient presents with   Annual Exam    Fasting annual exam with pelvic. Sees [facility]. B/L foot pain x couple months.     Sherry Huff is a 55 y.o. female who presents for a complete physical and follow-up on chronic problems.    She notes bilateral foot pain.  She has pain along the bottom of both feet (arches) for the last few months.  Worse after sitting and first thing in the morning.  She had been doing a lot of walking (cut back now).  There were some days where she didn't wear good shoes to walk (rain boots without arch support for some walks).  She has cut back on walking, wearing tennis shoes for walks, and massaging her feet.  It is improving, pain is just intermittent.  Hypertension: BP medication was changed from benazepril to metoprolol when she was diagnosed with hyperthyroidism.  She is taking 150 mg of metoprolol, in addition to HCTZ.  She denies side effects. No headaches, dizziness or chest pain. BP's are not monitored elsewhere (just on her watch, which are all good). Her watch states resting heart rate is in the 50's, but usually 60's-70's during the day. BP Readings from Last 3 Encounters:  07/02/21 120/74  12/06/20 130/80  10/19/20 (!) 146/95   Graves' disease.  She no longer sees endocrinologist.  She stopped methimazole in 04/2020 and her TSH at her physical was normal (06/2020), and again in May 2022.   Weight gain was noted at that visit--she had gained weight after her shoulder surgery (less active, sitting around more, "nibbling" more, more fast food, eating late. Also was having some sweetened hot tea, 1-2 mixed drinks/wk.)    She denies any change in symptoms--she has some mild constipation (chronic, unchanged) and notes some brittle nails, otherwise no change in temperature intolerance, changes in hair/skin/moods/energy. Intentional weight loss.  Due for recheck Lab Results  Component Value Date   TSH 2.080 12/06/2020      Hyperlipidemia follow-up:  Patient generally follows a low-fat, low cholesterol diet. Denies side effects from the Crestor. Also takes omega-3 fish oil  2 capsules daily. Her triglycerides were mildly elevated on last check.  This past weekend she was at [facility], had pizza, fried chicken--not her typical diet.  Lab Results  Component Value Date   CHOL 153 06/21/2020   HDL 50 06/21/2020   LDLCALC 75 06/21/2020   TRIG 166 (H) 06/21/2020   CHOLHDL 3.1 06/21/2020   Prior problems with neck pain, r/b PT (1 session) and regular home exercises. She still has some stiffness, but overall much better.  She continues to do stretches.  H/o Vitamin D deficiency: Level was normal at 45 in 10/2017, when taking MVI daily. In 2018 had been low at 17 (when not taking MVI).   She continues on the same MVI daily.   H/o Tobacco Abuse:  She quit smoking 06/2014 using Chantix.  She only used it for 4-6 weeks. She gained 20# after she quit smoking, had trouble losing it, but finally lost weight after giving up soda, also lost weight related to hyperthyroidism.  Once thyroid was adequately treated (and related to the pandemic), she regained the weight she lost. She had lost the weight again (was 169# in 07/2020), but regained weight related to her shoulder injury and surgery. Changes in diet as reported above.  Current diet: she tries to limit her carbs. She steam/air fries foods. Some sweets, tries  to limit. She drinks 3 hot teas/day, with 1 teaspoon of sugar.  Rare soda.  Wt Readings from Last 3 Encounters:  07/02/21 183 lb (83 kg)  12/06/20 187 lb 6.4 oz (85 kg)  10/17/20 172 lb (78 kg)    Immunization History  Administered Date(s) Administered   Influenza Split 06/04/2005, 06/19/2006, 04/25/2008   Influenza,inj,Quad PF,6+ Mos 08/01/2014, 05/31/2015, 05/26/2019, 06/21/2020   PFIZER(Purple Top)SARS-COV-2 Vaccination 12/29/2019, 01/19/2020   Pneumococcal Polysaccharide-23 10/19/2012   Td  03/06/1995, 06/04/2005   Tdap 10/19/2012   +varicella titer 06/2020 Last Pap smear: 11/2009; no abnormal paps; had hysterectomy for benign reasons Last mammogram: 12/2019, scheduled for later this month Last colonoscopy: 03/2017, had tubular adenoma, needs f/u in 5 years (03/2022) Last DEXA: never   Dentist:  every 6 months, past due (didn't get when getting PT) Ophtho: Yearly, but due. Noted to have borderline pressures in the past. Exercise:  Walks 2.5-3 miles 4x/week. No longer doing yoga (since shoulder injury/surgery). No weight-bearing exercise currently.  PMH, PSH, SH and FH were reviewed and updated  Outpatient Encounter Medications as of 07/02/2021  Medication Sig Note   hydrochlorothiazide (MICROZIDE) 12.5 MG capsule TAKE 1 CAPSULE BY MOUTH EVERY DAY (Patient taking differently: Take 12.5 mg by mouth every evening.)    ibuprofen (ADVIL) 200 MG tablet Take 400 mg by mouth every 6 (six) hours as needed. 07/02/2021: Took last night   metoprolol succinate (TOPROL-XL) 100 MG 24 hr tablet TAKE 1 & 1/2 TABLET BY MOUTH ONCE DAILY TAKE WITH OR IMMEDIATELY FOLLOWING A MEAL    Multiple Vitamin (MULTIVITAMIN WITH MINERALS) TABS tablet Take 1 tablet by mouth every evening. One-A-Day Multivitamin    Omega-3 Fatty Acids (FISH OIL) 1200 MG CAPS Take 2,400 mg by mouth every evening.    rosuvastatin (CRESTOR) 40 MG tablet TAKE 1 TABLET BY MOUTH EVERY DAY    [DISCONTINUED] aspirin EC 81 MG tablet Take 81 mg by mouth every evening. Swallow whole.    calcium carbonate (OSCAL) 1500 (600 Ca) MG TABS tablet Take 600 mg of elemental calcium by mouth every evening. (Patient not taking: Reported on 07/02/2021) 07/02/2021: Couple times a month   diphenhydramine-acetaminophen (TYLENOL PM) 25-500 MG TABS tablet Take 2 tablets by mouth at bedtime as needed (sleep). (Patient not taking: Reported on 07/02/2021) 07/02/2021: Uses prn only--if having pain, or if she hadn't been sleeping well   Polyethylene Glycol 400  (BLINK TEARS) 0.25 % SOLN Place 1 drop into both eyes at bedtime. (Patient not taking: Reported on 07/02/2021) 07/02/2021: Uses as needed   Probiotic Product (PROBIOTIC DAILY PO) Take 1 capsule by mouth daily. (Patient not taking: Reported on 07/02/2021)    Facility-Administered Encounter Medications as of 07/02/2021  Medication   0.9 %  sodium chloride infusion   Allergies  Allergen Reactions   Codeine Nausea And Vomiting   Penicillins Nausea And Vomiting   Strawberry (Diagnostic) Rash    Frozen only; now eats fresh strawberries    ROS: The patient denies anorexia, fever, vision changes, decreased hearing, ear pain, sore throat, breast concerns, chest pain, palpitations, dizziness, syncope, dyspnea on exertion, cough, swelling, nausea, vomiting, diarrhea, constipation, abdominal pain, melena, hematochezia, indigestion/heartburn, hematuria, incontinence, dysuria, vaginal bleeding, discharge, odor, genital lesions, joint pains, numbness, tingling, weakness, tremor, suspicious skin lesions, depression, anxiety, abnormal bleeding/bruising, or enlarged lymph nodes.  Mild hot flashes, only very occasional (less than in the past) Slight congestion/allergies, chronic. No discolored mucus. Some slight ache and popping in the R shoulder Mild pain in  both feet Mild vaginal dryness. Mild neck stiffness Infrequent indigestion. Very rare urinary leakage (sneeze with a full bladder).    PHYSICAL EXAM:  BP 120/74   Pulse 72   Ht 5\' 4"  (1.626 m)   Wt 183 lb (83 kg)   BMI 31.41 kg/m    Wt Readings from Last 3 Encounters:  07/02/21 183 lb (83 kg)  12/06/20 187 lb 6.4 oz (85 kg)  10/17/20 172 lb (78 kg)  Wt 167# at her CPE 06/2020 Wt 203# 3.2 oz at CPE 05/2019 Wt 179# at CPE 10/2017.   General Appearance:   Alert, cooperative, no distress, appears stated age    Head:   Normocephalic, without obvious abnormality, atraumatic    Eyes:   PERRL, conjunctiva/corneas clear, EOM's intact, fundi  benign. No proptosis    Ears:   Normal TM's and external ear canals    Nose:   Not examined, wearing mask due to COVID-19 pandemic  Throat:   Not examined, wearing mask due to COVID-19 pandemic   Neck:   Supple, no lymphadenopathy; thyroid: no enlargement/ tenderness/nodules; no carotid bruit or JVD.   Back:   Spine nontender, no curvature, ROM normal, no CVA tenderness. Mild tenderness at L SI joint. No pyriformis spasm  Lungs:   Clear to auscultation bilaterally without rales or ronchi;   respirations unlabored    Chest Wall:   No tenderness or deformity    Heart:   Regular rate and rhythm, S1 and S2 normal, no murmur, rub or gallop    Breast Exam:   No tenderness, masses, or nipple discharge or inversion. No axillary lymphadenopathy.   Abdomen:   Soft, non-tender, nondistended, normoactive bowel sounds, no masses, no hepatosplenomegaly    Genitalia:   External genitalia: Normal, no lesions. BUS and vagina normal.  Uterus absent, adnexa not enlarged, nontender, no masses. Pap not performed.   Rectal:   Normal sphincter tone, no masses; heme negative stool    Extremities:   No clubbing, cyanosis or edema. Mildly tender along arch of both feet and at PF insertion at anteromedial calcanei (bilat)    Pulses:   2+ and symmetric all extremities    Skin:   Skin color, texture, turgor normal, no lesions or rash  Lymph nodes:   Cervical, supraclavicular, inguinal and axillary nodes normal    Neurologic:   Normal strength, sensation and gait; reflexes 2+ and symmetric throughout                        Psych:  Normal mood, affect, hygiene and grooming     ASSESSMENT/PLAN:  Annual physical exam - Plan: HIV Antibody (routine testing w rflx), TSH, CBC with Differential/Platelet, Comprehensive metabolic panel, Lipid panel, POCT Urinalysis DIP (Proadvantage Device)  History of Graves' disease - off meds, monitoring for hypothyroidism.  DEXA recommended given h/o Graves and smoking (and now likely  postmenopausal) - Plan: DG Bone Density  Vitamin D deficiency - cont daily supplement  Essential hypertension, benign - controlled with current meds.  No SE, so will remain on BB and HCTZ - Plan: hydrochlorothiazide (MICROZIDE) 12.5 MG capsule, Comprehensive metabolic panel  Mixed hyperlipidemia - continue current regimen.  Values may be affected by thyroid - Plan: metoprolol succinate (TOPROL-XL) 100 MG 24 hr tablet, Lipid panel  Medication monitoring encounter - Plan: CBC with Differential/Platelet, Comprehensive metabolic panel, Lipid panel  Former smoker - Plan: DG Bone Density  Postmenopausal estrogen deficiency - Plan: DG Bone Density  Plantar fasciitis, bilateral - mild, reviewed proper shoes/arch supports, stretches. f/u if persists/worsens  Need for influenza vaccination - Plan: Flu Vaccine QUAD 6+ mos PF IM (Fluarix Quad PF)  RF HCTZ and metoprolol (crestor filled x90d 11/15)  Discussed monthly self breast exams and yearly mammograms; at least 30 minutes of aerobic activity at least 5 days/week, weight-bearing exercise at least 2x/wk; proper sunscreen use reviewed; healthy diet, including goals of calcium and vitamin D intake and alcohol recommendations (less than or equal to 1 drink/day) reviewed; regular seatbelt use; changing batteries in smoke detectors. Immunization recommendations discussed--yearly flu shots recommended, given today. Bivalent COVID booster highly encouraged, declined.  Shingrix recommended, to check with her insurance and schedule [place] when convenient.  Colonoscopy recommendations reviewed--UTD, repeat 03/2022. DEXA recommended given her h/o Grave's dz and former smoker.   F/u 1 year for CPE, sooner if abnormal labs or sx related to thyroid develop (hypothyroid sx reviewed)

## 2021-07-01 NOTE — Patient Instructions (Addendum)
  HEALTH MAINTENANCE RECOMMENDATIONS:  It is recommended that you get at least 30 minutes of aerobic exercise at least 5 days/week (for weight loss, you may need as much as 60-90 minutes). This can be any activity that gets your heart rate up. This can be divided in 10-15 minute intervals if needed, but try and build up your endurance at least once a week.  Weight bearing exercise is also recommended twice weekly.  Eat a healthy diet with lots of vegetables, fruits and fiber.  "Colorful" foods have a lot of vitamins (ie green vegetables, tomatoes, red peppers, etc).  Limit sweet tea, regular sodas and alcoholic beverages, all of which has a lot of calories and sugar.  Up to 1 alcoholic drink daily may be beneficial for women (unless trying to lose weight, watch sugars).  Drink a lot of water.  Calcium recommendations are 1200-1500 mg daily (1500 mg for postmenopausal women or women without ovaries), and vitamin D 1000 IU daily.  This should be obtained from diet and/or supplements (vitamins), and calcium should not be taken all at once, but in divided doses.  Monthly self breast exams and yearly mammograms for women over the age of 33 is recommended.  Sunscreen of at least SPF 30 should be used on all sun-exposed parts of the skin when outside between the hours of 10 am and 4 pm (not just when at beach or pool, but even with exercise, golf, tennis, and yard work!)  Use a sunscreen that says "broad spectrum" so it covers both UVA and UVB rays, and make sure to reapply every 1-2 hours.  Remember to change the batteries in your smoke detectors when changing your clock times in the spring and fall. Carbon monoxide detectors are recommended for your home.  Use your seat belt every time you are in a car, and please drive safely and not be distracted with cell phones and texting while driving.  I recommend getting the new shingles vaccine (Shingrix). You may want to check with your insurance to verify what  your out of pocket cost may be (usually covered as preventative, but better to verify to avoid any surprises, as this vaccine is expensive), and then schedule a nurse visit at our office when convenient (based on the possible side effects as discussed).   This is a series of 2 injections, spaced 2 months apart.  It doesn't have to be exactly 2 months apart (but can't be sooner), if that isn't feasible for your schedule, but try and get them close to 2 months (and definitely within 6 months of each other, or else the efficacy of the vaccine drops off). This should be separated from other vaccines by at least 2 weeks.  I also highly recommend the bivalent COVID booster. Any vaccines should be separated by at least 2 weeks.  Consider trying Raynelle Chary in place of sugar in your tea. Try selzer water when you need some carbonation. Try and avoid regular soda. Add in weight-bearing exercise, as discussed.

## 2021-07-02 ENCOUNTER — Ambulatory Visit (INDEPENDENT_AMBULATORY_CARE_PROVIDER_SITE_OTHER): Payer: BC Managed Care – PPO | Admitting: Family Medicine

## 2021-07-02 ENCOUNTER — Other Ambulatory Visit: Payer: Self-pay

## 2021-07-02 ENCOUNTER — Encounter: Payer: Self-pay | Admitting: Family Medicine

## 2021-07-02 VITALS — BP 120/74 | HR 72 | Ht 64.0 in | Wt 183.0 lb

## 2021-07-02 DIAGNOSIS — Z8639 Personal history of other endocrine, nutritional and metabolic disease: Secondary | ICD-10-CM | POA: Diagnosis not present

## 2021-07-02 DIAGNOSIS — Z5181 Encounter for therapeutic drug level monitoring: Secondary | ICD-10-CM

## 2021-07-02 DIAGNOSIS — M722 Plantar fascial fibromatosis: Secondary | ICD-10-CM

## 2021-07-02 DIAGNOSIS — E782 Mixed hyperlipidemia: Secondary | ICD-10-CM

## 2021-07-02 DIAGNOSIS — Z87891 Personal history of nicotine dependence: Secondary | ICD-10-CM

## 2021-07-02 DIAGNOSIS — E559 Vitamin D deficiency, unspecified: Secondary | ICD-10-CM | POA: Diagnosis not present

## 2021-07-02 DIAGNOSIS — I10 Essential (primary) hypertension: Secondary | ICD-10-CM

## 2021-07-02 DIAGNOSIS — Z780 Asymptomatic menopausal state: Secondary | ICD-10-CM | POA: Diagnosis not present

## 2021-07-02 DIAGNOSIS — Z0000 Encounter for general adult medical examination without abnormal findings: Secondary | ICD-10-CM

## 2021-07-02 DIAGNOSIS — Z23 Encounter for immunization: Secondary | ICD-10-CM | POA: Diagnosis not present

## 2021-07-02 LAB — POCT URINALYSIS DIP (PROADVANTAGE DEVICE)
Bilirubin, UA: NEGATIVE
Glucose, UA: NEGATIVE mg/dL
Ketones, POC UA: NEGATIVE mg/dL
Leukocytes, UA: NEGATIVE
Nitrite, UA: NEGATIVE
Protein Ur, POC: 30 mg/dL — AB
Specific Gravity, Urine: 1.02
Urobilinogen, Ur: NEGATIVE
pH, UA: 6 (ref 5.0–8.0)

## 2021-07-02 MED ORDER — HYDROCHLOROTHIAZIDE 12.5 MG PO CAPS: ORAL_CAPSULE | ORAL | 3 refills | Status: DC

## 2021-07-02 MED ORDER — METOPROLOL SUCCINATE ER 100 MG PO TB24: ORAL_TABLET | ORAL | 1 refills | Status: DC

## 2021-07-03 LAB — CBC WITH DIFFERENTIAL/PLATELET
Basophils Absolute: 0 10*3/uL (ref 0.0–0.2)
Basos: 1 %
EOS (ABSOLUTE): 0.1 10*3/uL (ref 0.0–0.4)
Eos: 2 %
Hematocrit: 45.6 % (ref 34.0–46.6)
Hemoglobin: 15.4 g/dL (ref 11.1–15.9)
Immature Grans (Abs): 0 10*3/uL (ref 0.0–0.1)
Immature Granulocytes: 0 %
Lymphocytes Absolute: 2.4 10*3/uL (ref 0.7–3.1)
Lymphs: 44 %
MCH: 28.9 pg (ref 26.6–33.0)
MCHC: 33.8 g/dL (ref 31.5–35.7)
MCV: 86 fL (ref 79–97)
Monocytes Absolute: 0.4 10*3/uL (ref 0.1–0.9)
Monocytes: 7 %
Neutrophils Absolute: 2.4 10*3/uL (ref 1.4–7.0)
Neutrophils: 46 %
Platelets: 222 10*3/uL (ref 150–450)
RBC: 5.32 x10E6/uL — ABNORMAL HIGH (ref 3.77–5.28)
RDW: 13.1 % (ref 11.7–15.4)
WBC: 5.3 10*3/uL (ref 3.4–10.8)

## 2021-07-03 LAB — LIPID PANEL
Chol/HDL Ratio: 3.4 ratio (ref 0.0–4.4)
Cholesterol, Total: 159 mg/dL (ref 100–199)
HDL: 47 mg/dL (ref >39)
LDL Chol Calc (NIH): 85 mg/dL (ref 0–99)
Triglycerides: 157 mg/dL — ABNORMAL HIGH (ref 0–149)
VLDL Cholesterol Cal: 27 mg/dL (ref 5–40)

## 2021-07-03 LAB — COMPREHENSIVE METABOLIC PANEL
ALT: 19 IU/L (ref 0–32)
AST: 18 IU/L (ref 0–40)
Albumin/Globulin Ratio: 2.2 (ref 1.2–2.2)
Albumin: 4.9 g/dL (ref 3.8–4.9)
Alkaline Phosphatase: 64 IU/L (ref 44–121)
BUN/Creatinine Ratio: 18 (ref 9–23)
BUN: 17 mg/dL (ref 6–24)
Bilirubin Total: 0.8 mg/dL (ref 0.0–1.2)
CO2: 25 mmol/L (ref 20–29)
Calcium: 9.7 mg/dL (ref 8.7–10.2)
Chloride: 101 mmol/L (ref 96–106)
Creatinine, Ser: 0.93 mg/dL (ref 0.57–1.00)
Globulin, Total: 2.2 g/dL (ref 1.5–4.5)
Glucose: 95 mg/dL (ref 70–99)
Potassium: 4 mmol/L (ref 3.5–5.2)
Sodium: 141 mmol/L (ref 134–144)
Total Protein: 7.1 g/dL (ref 6.0–8.5)
eGFR: 73 mL/min/{1.73_m2} (ref >59)

## 2021-07-03 LAB — HIV ANTIBODY (ROUTINE TESTING W REFLEX): HIV Screen 4th Generation wRfx: NONREACTIVE

## 2021-07-03 LAB — TSH: TSH: 0.883 u[IU]/mL (ref 0.450–4.500)

## 2021-07-19 ENCOUNTER — Ambulatory Visit
Admission: RE | Admit: 2021-07-19 | Discharge: 2021-07-19 | Disposition: A | Discharge status: Home / Self Care | Payer: BC Managed Care – PPO | Source: Ambulatory Visit | Attending: Family Medicine | Admitting: Family Medicine

## 2021-07-19 DIAGNOSIS — Z1231 Encounter for screening mammogram for malignant neoplasm of breast: Secondary | ICD-10-CM

## 2021-09-29 ENCOUNTER — Other Ambulatory Visit: Payer: Self-pay | Admitting: Family Medicine

## 2021-09-29 DIAGNOSIS — E782 Mixed hyperlipidemia: Secondary | ICD-10-CM

## 2021-11-27 ENCOUNTER — Ambulatory Visit
Admission: RE | Admit: 2021-11-27 | Discharge: 2021-11-27 | Disposition: A | Discharge status: Home / Self Care | Payer: 59 | Source: Ambulatory Visit | Attending: Family Medicine | Admitting: Family Medicine

## 2021-11-27 DIAGNOSIS — Z87891 Personal history of nicotine dependence: Secondary | ICD-10-CM

## 2021-11-27 DIAGNOSIS — Z8639 Personal history of other endocrine, nutritional and metabolic disease: Secondary | ICD-10-CM

## 2021-11-27 DIAGNOSIS — Z780 Asymptomatic menopausal state: Secondary | ICD-10-CM

## 2021-12-25 ENCOUNTER — Other Ambulatory Visit: Payer: Self-pay | Admitting: Family Medicine

## 2021-12-25 DIAGNOSIS — E782 Mixed hyperlipidemia: Secondary | ICD-10-CM

## 2022-01-02 NOTE — Progress Notes (Signed)
Chief Complaint  Patient presents with   med check     Fasting  no other concerns     Patient presents for 6 month follow-up.   Hypertension: BP medication was changed from benazepril to metoprolol when she was diagnosed with hyperthyroidism.  She is taking 150 mg of metoprolol, in addition to HCTZ. She denies side effects. No headaches, dizziness or chest pain. BP's at home run 120/70's. Pulse stays in the upper 50's per her watch.  BP Readings from Last 3 Encounters:  01/03/22 118/76  07/02/21 120/74  12/06/20 130/80    Graves' disease.  She no longer sees endocrinologist.  She stopped methimazole in 04/2020 and her TSH has been normal, last check in 06/2021.    Lab Results  Component Value Date   TSH 0.883 07/02/2021   She has noticed increased fatigue over the last 6 weeks.  She is sleeping very well (9-9.5 hours)  Is exhausted by 8pm.  She doesn't feel refreshed when she wakes up.  Snores if she is on her back, but not aware of any sleep apnea (checks watch re: her sleep, awakenings).  No change in mild constipation. +Brittle nails, seems worse than previously discussed.  She is a little colder than she used . No changes to hair, moods.  She had DEXA showing mild osteopenia (T-1.5 at R fem neck) in 11/2021. She is taking Calcium with D.  Hasn't been getting weight-bearing exercise recently.    Hyperlipidemia follow-up:  Patient generally follows a low-fat, low cholesterol diet. Denies side effects from the Crestor. Also takes omega-3 fish oil  2 capsules daily. Her triglycerides were mildly elevated on last check.  Lab Results  Component Value Date   CHOL 159 07/02/2021   HDL 47 07/02/2021   LDLCALC 85 07/02/2021   TRIG 157 (H) 07/02/2021   CHOLHDL 3.4 07/02/2021   H/o Vitamin D deficiency: Level was normal at 45 in 10/2017, when taking MVI daily. In 2018 had been low at 17 (when not taking MVI).   She continues on the same MVI daily, and taking calcium with D once daily.     PMH, PSH, SH reviewed  Outpatient Encounter Medications as of 01/03/2022  Medication Sig Note   calcium carbonate (OSCAL) 1500 (600 Ca) MG TABS tablet Take 600 mg of elemental calcium by mouth every evening.    hydrochlorothiazide (MICROZIDE) 12.5 MG capsule TAKE 1 CAPSULE BY MOUTH EVERY DAY    ibuprofen (ADVIL) 200 MG tablet Take 400 mg by mouth every 6 (six) hours as needed. 01/03/2022: Prn last used a week ago   metoprolol succinate (TOPROL-XL) 100 MG 24 hr tablet TAKE 1 & 1/2 TABLET BY MOUTH ONCE DAILY TAKE WITH OR IMMEDIATELY FOLLOWING A MEAL    Multiple Vitamin (MULTIVITAMIN WITH MINERALS) TABS tablet Take 1 tablet by mouth every evening. One-A-Day Multivitamin    Omega-3 Fatty Acids (FISH OIL) 1200 MG CAPS Take 2,400 mg by mouth every evening.    Polyethylene Glycol 400 (BLINK TEARS) 0.25 % SOLN Place 1 drop into both eyes at bedtime. 01/03/2022: Prn  for the past few days needed   rosuvastatin (CRESTOR) 40 MG tablet TAKE 1 TABLET BY MOUTH EVERY DAY    diphenhydramine-acetaminophen (TYLENOL PM) 25-500 MG TABS tablet Take 2 tablets by mouth at bedtime as needed (sleep). (Patient not taking: Reported on 07/02/2021) 01/03/2022: PRN last used one month ago   Probiotic Product (PROBIOTIC DAILY PO) Take 1 capsule by mouth daily. (Patient not taking: Reported on  07/02/2021) 01/03/2022: Last used over a month ago   Facility-Administered Encounter Medications as of 01/03/2022  Medication   0.9 %  sodium chloride infusion   Allergies  Allergen Reactions   Codeine Nausea And Vomiting   Penicillins Nausea And Vomiting   Strawberry (Diagnostic) Rash    Frozen only; now eats fresh strawberries   ROS: No fever, chills, no allergy symptoms other than some mild eye watering.  No chest pain, shortness of breath, palpitations, edema.  No n/v/heartburn, diarrhea. Mild constipation, no blood in the stools. Moods are good. No rashes, easy bleeding, bruising. Sleeps well, wakes up tired. Snores. Fatigue  pre HPI.   PHYSICAL EXAM:  BP 118/76   Pulse (!) 58   Temp 98.2 F (36.8 C)   Wt 190 lb 6.4 oz (86.4 kg)   SpO2 97%   BMI 32.68 kg/m   Wt Readings from Last 3 Encounters:  01/03/22 190 lb 6.4 oz (86.4 kg)  07/02/21 183 lb (83 kg)  12/06/20 187 lb 6.4 oz (85 kg)   Well developed, pleasant female in no distress HEENT: Conjunctiva and sclera are clear, EOMI. OP clear. Neck: no lymphadenopathy, thyromegaly or mass, no bruit Heart: Mild bradycardia, regular rhythm, no murmur Lungs: clear bilaterally Abdomen: soft, nontender, no organomegaly or mass Back: no spinal or CVA tenderness Extremities: no edema, 2+ pulses Psych: normal mood, affect Neuro: alert and oriented, cranial nerves grossly intact. Normal gait   ASSESSMENT/PLAN:  Essential hypertension, benign - well controlled  mild bradycardia, doubt contributing to her fatigue, but will monitor more closely. cont HCTZ and metoprolol - Plan: Comprehensive metabolic panel, metoprolol succinate (TOPROL-XL) 100 MG 24 hr tablet  Mixed hyperlipidemia - cont crestor and fish oil.  TG mildly elevated on last check. Lowfat diet reviewed - Plan: rosuvastatin (CRESTOR) 40 MG tablet  History of Graves' disease - At risk for developing hypothyroidism.  +sx of fatigue, recheck labs.  TSH <1 in December. - Plan: TSH  Osteopenia of neck of right femur - discussed Ca, D, weight-bearing exercise.  Mild - Plan: VITAMIN D 25 Hydroxy (Vit-D Deficiency, Fractures)  Fatigue, unspecified type - Ddx reviewed. Will recheck labs. If labs normal, concern for OSA, and will monitor BP/pulse closely - Plan: TSH, VITAMIN D 25 Hydroxy (Vit-D Deficiency, Fractures), CBC with Differential/Platelet, Comprehensive metabolic panel  Snoring - low threshold for checking sleep study if fatigue persists and labs normal  Vitamin D deficiency - and mild osteopenia.  With increasing fatigue, will recheck level (though taking more D now that she is taking Ca +D  regularly) - Plan: VITAMIN D 25 Hydroxy (Vit-D Deficiency, Fractures)   F/u as scheduled in 06/2022 for CPE  Fasting  Please stay well hydrated--this is especially important over the summer and with higher humidity. Aim for a very pale yellow urine. Please monitor your blood pressure and pulse.  Record these and send me a list through [facility] in a few weeks, especially if your energy isn't improving, and if your labs all seem okay.  Sleep apnea is a definite possibility behind your feeling tired.  We should evaluate you for this with a sleep study if no other cause is found. Try not to sleep more than 9 hours/night. Try and get some regular exercise daily.

## 2022-01-03 ENCOUNTER — Ambulatory Visit (INDEPENDENT_AMBULATORY_CARE_PROVIDER_SITE_OTHER): Payer: 59 | Admitting: Family Medicine

## 2022-01-03 ENCOUNTER — Encounter: Payer: Self-pay | Admitting: Family Medicine

## 2022-01-03 VITALS — BP 118/76 | HR 58 | Temp 98.2°F | Wt 190.4 lb

## 2022-01-03 DIAGNOSIS — Z8639 Personal history of other endocrine, nutritional and metabolic disease: Secondary | ICD-10-CM | POA: Diagnosis not present

## 2022-01-03 DIAGNOSIS — M85851 Other specified disorders of bone density and structure, right thigh: Secondary | ICD-10-CM

## 2022-01-03 DIAGNOSIS — R0683 Snoring: Secondary | ICD-10-CM

## 2022-01-03 DIAGNOSIS — E782 Mixed hyperlipidemia: Secondary | ICD-10-CM

## 2022-01-03 DIAGNOSIS — E559 Vitamin D deficiency, unspecified: Secondary | ICD-10-CM

## 2022-01-03 DIAGNOSIS — I10 Essential (primary) hypertension: Secondary | ICD-10-CM

## 2022-01-03 DIAGNOSIS — R5383 Other fatigue: Secondary | ICD-10-CM

## 2022-01-03 MED ORDER — METOPROLOL SUCCINATE ER 100 MG PO TB24: ORAL_TABLET | ORAL | 1 refills | Status: DC

## 2022-01-03 MED ORDER — ROSUVASTATIN CALCIUM 40 MG PO TABS: 40.0000 mg | ORAL_TABLET | Freq: Every day | ORAL | 1 refills | Status: DC

## 2022-01-03 NOTE — Patient Instructions (Signed)
Please stay well hydrated--this is especially important over the summer and with higher humidity. Aim for a very pale yellow urine. Please monitor your blood pressure and pulse.  Record these and send me a list through [facility] in a few weeks, especially if your energy isn't improving, and if your labs all seem okay.  Sleep apnea is a definite possibility behind your feeling tired.  We should evaluate you for this with a sleep study if no other cause is found. Try not to sleep more than 9 hours/night. Try and get some regular exercise daily.

## 2022-01-04 LAB — COMPREHENSIVE METABOLIC PANEL
ALT: 23 IU/L (ref 0–32)
AST: 23 IU/L (ref 0–40)
Albumin/Globulin Ratio: 2.2 (ref 1.2–2.2)
Albumin: 4.9 g/dL (ref 3.8–4.9)
Alkaline Phosphatase: 60 IU/L (ref 44–121)
BUN/Creatinine Ratio: 19 (ref 9–23)
BUN: 17 mg/dL (ref 6–24)
Bilirubin Total: 0.6 mg/dL (ref 0.0–1.2)
CO2: 22 mmol/L (ref 20–29)
Calcium: 9.6 mg/dL (ref 8.7–10.2)
Chloride: 100 mmol/L (ref 96–106)
Creatinine, Ser: 0.89 mg/dL (ref 0.57–1.00)
Globulin, Total: 2.2 g/dL (ref 1.5–4.5)
Glucose: 92 mg/dL (ref 70–99)
Potassium: 4 mmol/L (ref 3.5–5.2)
Sodium: 143 mmol/L (ref 134–144)
Total Protein: 7.1 g/dL (ref 6.0–8.5)
eGFR: 77 mL/min/{1.73_m2} (ref >59)

## 2022-01-04 LAB — CBC WITH DIFFERENTIAL/PLATELET
Basophils Absolute: 0 10*3/uL (ref 0.0–0.2)
Basos: 0 %
EOS (ABSOLUTE): 0.2 10*3/uL (ref 0.0–0.4)
Eos: 2 %
Hematocrit: 45.2 % (ref 34.0–46.6)
Hemoglobin: 15.5 g/dL (ref 11.1–15.9)
Immature Grans (Abs): 0 10*3/uL (ref 0.0–0.1)
Immature Granulocytes: 0 %
Lymphocytes Absolute: 2.6 10*3/uL (ref 0.7–3.1)
Lymphs: 35 %
MCH: 29.1 pg (ref 26.6–33.0)
MCHC: 34.3 g/dL (ref 31.5–35.7)
MCV: 85 fL (ref 79–97)
Monocytes Absolute: 0.4 10*3/uL (ref 0.1–0.9)
Monocytes: 6 %
Neutrophils Absolute: 4.2 10*3/uL (ref 1.4–7.0)
Neutrophils: 57 %
Platelets: 241 10*3/uL (ref 150–450)
RBC: 5.32 x10E6/uL — ABNORMAL HIGH (ref 3.77–5.28)
RDW: 13.1 % (ref 11.7–15.4)
WBC: 7.4 10*3/uL (ref 3.4–10.8)

## 2022-01-04 LAB — VITAMIN D 25 HYDROXY (VIT D DEFICIENCY, FRACTURES): Vit D, 25-Hydroxy: 37 ng/mL (ref 30.0–100.0)

## 2022-01-04 LAB — TSH: TSH: 1.8 u[IU]/mL (ref 0.450–4.500)

## 2022-01-07 ENCOUNTER — Other Ambulatory Visit: Payer: Self-pay | Admitting: *Deleted

## 2022-01-07 DIAGNOSIS — R5383 Other fatigue: Secondary | ICD-10-CM

## 2022-01-07 DIAGNOSIS — G478 Other sleep disorders: Secondary | ICD-10-CM

## 2022-01-07 DIAGNOSIS — R0683 Snoring: Secondary | ICD-10-CM

## 2022-03-15 ENCOUNTER — Encounter (HOSPITAL_BASED_OUTPATIENT_CLINIC_OR_DEPARTMENT_OTHER): Payer: 59 | Admitting: Internal Medicine

## 2022-03-27 ENCOUNTER — Encounter: Payer: Self-pay | Admitting: Internal Medicine

## 2022-04-10 ENCOUNTER — Encounter: Payer: Self-pay | Admitting: Gastroenterology

## 2022-04-30 ENCOUNTER — Encounter: Payer: Self-pay | Admitting: Internal Medicine

## 2022-06-30 ENCOUNTER — Other Ambulatory Visit: Payer: Self-pay | Admitting: Family Medicine

## 2022-06-30 DIAGNOSIS — E782 Mixed hyperlipidemia: Secondary | ICD-10-CM

## 2022-06-30 DIAGNOSIS — I10 Essential (primary) hypertension: Secondary | ICD-10-CM

## 2022-07-18 ENCOUNTER — Encounter: Payer: BC Managed Care – PPO | Admitting: Family Medicine

## 2022-07-31 ENCOUNTER — Other Ambulatory Visit: Payer: Self-pay | Admitting: Family Medicine

## 2022-07-31 DIAGNOSIS — Z1231 Encounter for screening mammogram for malignant neoplasm of breast: Secondary | ICD-10-CM

## 2022-08-04 NOTE — Patient Instructions (Incomplete)
  HEALTH MAINTENANCE RECOMMENDATIONS:  It is recommended that you get at least 30 minutes of aerobic exercise at least 5 days/week (for weight loss, you may need as much as 60-90 minutes). This can be any activity that gets your heart rate up. This can be divided in 10-15 minute intervals if needed, but try and build up your endurance at least once a week.  Weight bearing exercise is also recommended twice weekly.  Eat a healthy diet with lots of vegetables, fruits and fiber.  "Colorful" foods have a lot of vitamins (ie green vegetables, tomatoes, red peppers, etc).  Limit sweet tea, regular sodas and alcoholic beverages, all of which has a lot of calories and sugar.  Up to 1 alcoholic drink daily may be beneficial for women (unless trying to lose weight, watch sugars).  Drink a lot of water.  Calcium recommendations are 1200-1500 mg daily (1500 mg for postmenopausal women or women without ovaries), and vitamin D 1000 IU daily.  This should be obtained from diet and/or supplements (vitamins), and calcium should not be taken all at once, but in divided doses.  Monthly self breast exams and yearly mammograms for women over the age of 7 is recommended.  Sunscreen of at least SPF 30 should be used on all sun-exposed parts of the skin when outside between the hours of 10 am and 4 pm (not just when at beach or pool, but even with exercise, golf, tennis, and yard work!)  Use a sunscreen that says "broad spectrum" so it covers both UVA and UVB rays, and make sure to reapply every 1-2 hours.  Remember to change the batteries in your smoke detectors when changing your clock times in the spring and fall. Carbon monoxide detectors are recommended for your home.  Use your seat belt every time you are in a car, and please drive safely and not be distracted with cell phones and texting while driving.  I recommend getting the new shingles vaccine (Shingrix). This is a series of 2 injections, spaced 2 months apart.   It doesn't have to be exactly 2 months apart (but can't be sooner), if that isn't feasible for your schedule, but try and get them close to 2 months (and definitely within 6 months of each other, or else the efficacy of the vaccine drops off). This should be separated from other vaccines by at least 2 weeks.  Please schedule a routine eye exam.

## 2022-08-04 NOTE — Progress Notes (Unsigned)
No chief complaint on file.  Sherry Huff is a 57 y.o. female who presents for a complete physical and follow-up on chronic problems.    Hypertension: BP medication was changed from benazepril to metoprolol when she was diagnosed with hyperthyroidism.  She is taking 150 mg of metoprolol, in addition to HCTZ. She denies side effects. No headaches, dizziness or chest pain. BP's at home run  Pulse  BP Readings from Last 3 Encounters:  01/03/22 118/76  07/02/21 120/74  12/06/20 130/80   Graves' disease.  She no longer sees endocrinologist.  She stopped methimazole in 04/2020 and her TSH has been normal, last check in 12/2021.   Lab Results  Component Value Date   TSH 1.800 01/03/2022   In June she had reported 6 weeks of increased fatigue, despite sleeping very well (9-9.5 hours), was exhausted by 8pm. She reported unrefreshed sleep, snoring if sleeping on her back. TSH was normal at that time. We had discussed hydration, and the possibility of doing a sleep study. Currently she reports  No change in mild constipation. +Brittle nails (not new).No changes to hair, moods.   Mild osteopenia per DEXA (T-1.5 at R fem neck) in 11/2021. She is taking Calcium with D.  Hasn't been getting weight-bearing exercise UPDATE ***   Hyperlipidemia follow-up:  Patient generally follows a low-fat, low cholesterol diet. Denies side effects from the Crestor. Also takes omega-3 fish oil  2 capsules daily. Her triglycerides were mildly elevated on last check (had pizza and fried chicken the weekend prior, at [facility]).  Due for recheck Lab Results  Component Value Date   CHOL 159 07/02/2021   HDL 47 07/02/2021   LDLCALC 85 07/02/2021   TRIG 157 (H) 07/02/2021   CHOLHDL 3.4 07/02/2021   H/o Vitamin D deficiency: Level was normal at 45 in 10/2017, when taking MVI daily. In 2018 had been low at 17 (when not taking MVI).   She continues on the same MVI daily, and taking calcium with D once  daily.  Obesity: Current diet: she tries to limit her carbs. She steam/air fries foods. Some sweets, tries to limit. She drinks 3 hot teas/day, with 1 teaspoon of sugar.  Rare soda.  Wt Readings from Last 3 Encounters:  01/03/22 190 lb 6.4 oz (86.4 kg)  07/02/21 183 lb (83 kg)  12/06/20 187 lb 6.4 oz (85 kg)    Immunization History  Administered Date(s) Administered   Influenza Split 06/04/2005, 06/19/2006, 04/25/2008   Influenza,inj,Quad PF,6+ Mos 08/01/2014, 05/31/2015, 05/26/2019, 06/21/2020, 07/02/2021   PFIZER(Purple Top)SARS-COV-2 Vaccination 12/29/2019, 01/19/2020   Pneumococcal Polysaccharide-23 10/19/2012   Td 03/06/1995, 06/04/2005   Tdap 10/19/2012   +varicella titer 06/2020 Last Pap smear: 11/2009; no abnormal paps; had hysterectomy for benign reasons Last mammogram: 06/2021, scheduled for 09/20/22 Last colonoscopy: 03/2017, had tubular adenoma, needs f/u in 5 years (03/2022)--this was changed to 7 yr f/u, now due 03/2024 (see letter to pt from Dr. Loletha Carrow) Last DEXA: 11/2021, T-1.5 at R fem neck   Dentist:  every 6 months Ophtho: Yearly, but due. Noted to have borderline pressures in the past. UPDATE Exercise:  Walks 2.5-3 miles 4x/week.  No longer doing yoga (since shoulder injury/surgery).  No weight-bearing exercise currently.   PMH, PSH, SH and FH were reviewed and updated     ROS: The patient denies anorexia, fever, vision changes, decreased hearing, ear pain, sore throat, breast concerns, chest pain, palpitations, dizziness, syncope, dyspnea on exertion, cough, swelling, nausea, vomiting, diarrhea, constipation,  abdominal pain, melena, hematochezia, indigestion/heartburn, hematuria, incontinence, dysuria, vaginal bleeding, discharge, odor, genital lesions, joint pains, numbness, tingling, weakness, tremor, suspicious skin lesions, depression, anxiety, abnormal bleeding/bruising, or enlarged lymph nodes.  Mild hot flashes, only very occasional (less than in the  past) Slight congestion/allergies, chronic. No discolored mucus. Infrequent indigestion. Very rare urinary leakage (sneeze with a full bladder).    PHYSICAL EXAM:  There were no vitals taken for this visit.   Wt Readings from Last 3 Encounters:  01/03/22 190 lb 6.4 oz (86.4 kg)  07/02/21 183 lb (83 kg)  12/06/20 187 lb 6.4 oz (85 kg)  Wt 167# at her CPE 06/2020 Wt 203# 3.2 oz at CPE 05/2019 Wt 179# at CPE 10/2017.   General Appearance:   Alert, cooperative, no distress, appears stated age    Head:   Normocephalic, without obvious abnormality, atraumatic    Eyes:   PERRL, conjunctiva/corneas clear, EOM's intact, fundi benign. No proptosis    Ears:   Normal TM's and external ear canals    Nose:   Not examined, wearing mask due to COVID-19 pandemic  Throat:   Not examined, wearing mask due to COVID-19 pandemic   Neck:   Supple, no lymphadenopathy; thyroid: no enlargement/ tenderness/nodules; no carotid bruit or JVD.   Back:   Spine nontender, no curvature, ROM normal, no CVA tenderness. Mild tenderness at L SI joint. No pyriformis spasm  Lungs:   Clear to auscultation bilaterally without rales or ronchi;   respirations unlabored    Chest Wall:   No tenderness or deformity    Heart:   Regular rate and rhythm, S1 and S2 normal, no murmur, rub or gallop    Breast Exam:   No tenderness, masses, or nipple discharge or inversion. No axillary lymphadenopathy.   Abdomen:   Soft, non-tender, nondistended, normoactive bowel sounds, no masses, no hepatosplenomegaly    Genitalia:   External genitalia: Normal, no lesions. BUS and vagina normal.  Uterus is surgically absent, adnexa not enlarged, nontender, no masses. Pap not performed.   Rectal:   Normal sphincter tone, no masses; heme negative stool    Extremities:   No clubbing, cyanosis or edema.   Pulses:   2+ and symmetric all extremities    Skin:   Skin color, texture, turgor normal, no lesions or rash  Lymph nodes:   Cervical,  supraclavicular, inguinal and axillary nodes normal    Neurologic:   Normal strength, sensation and gait; reflexes 2+ and symmetric throughout                        Psych:  Normal mood, affect, hygiene and grooming  UPDATE IF NOT WEARING MASK  ASSESSMENT/PLAN:  Looks like you ordered home sleep test 12/2021--never done?  Was she contacted? Or was it not ordered right? This was done for her fatigue and snoring.  Enter/offer/decline flu and COVID TdaP also due (guessing she will take flu and TdaP and decline COVID?) Shingrix rec--?NV in 2 weeks  No RF needed, given 90d in December  Discussed monthly self breast exams and yearly mammograms; at least 30 minutes of aerobic activity at least 5 days/week, weight-bearing exercise at least 2x/wk; proper sunscreen use reviewed; healthy diet, including goals of calcium and vitamin D intake and alcohol recommendations (less than or equal to 1 drink/day) reviewed; regular seatbelt use; changing batteries in smoke detectors. Immunization recommendations discussed--yearly flu shots recommended, given today.  TdaP given today Bivalent COVID booster recommended, declined.  Shingrix recommended, to schedule NV in 2 weeks. Colonoscopy recommendations reviewed--UTD, repeat 03/2024 (changed to 7 yr f/u, rather than 5 yr f/u, reviewed by Dr. Loletha Carrow in 03/2022, when originally due).  F/u 1 year for CPE, sooner if abnormal labs or sx related to thyroid develop (hypothyroid sx reviewed)

## 2022-08-05 ENCOUNTER — Encounter: Payer: Self-pay | Admitting: Family Medicine

## 2022-08-05 ENCOUNTER — Ambulatory Visit: Payer: 59 | Admitting: Family Medicine

## 2022-08-05 VITALS — BP 130/80 | HR 64 | Ht 63.0 in | Wt 196.8 lb

## 2022-08-05 DIAGNOSIS — I10 Essential (primary) hypertension: Secondary | ICD-10-CM | POA: Diagnosis not present

## 2022-08-05 DIAGNOSIS — E6609 Other obesity due to excess calories: Secondary | ICD-10-CM

## 2022-08-05 DIAGNOSIS — E559 Vitamin D deficiency, unspecified: Secondary | ICD-10-CM

## 2022-08-05 DIAGNOSIS — E782 Mixed hyperlipidemia: Secondary | ICD-10-CM | POA: Diagnosis not present

## 2022-08-05 DIAGNOSIS — Z0000 Encounter for general adult medical examination without abnormal findings: Secondary | ICD-10-CM

## 2022-08-05 DIAGNOSIS — Z6834 Body mass index (BMI) 34.0-34.9, adult: Secondary | ICD-10-CM | POA: Diagnosis not present

## 2022-08-05 DIAGNOSIS — Z23 Encounter for immunization: Secondary | ICD-10-CM | POA: Diagnosis not present

## 2022-08-05 DIAGNOSIS — M85851 Other specified disorders of bone density and structure, right thigh: Secondary | ICD-10-CM

## 2022-08-05 DIAGNOSIS — R635 Abnormal weight gain: Secondary | ICD-10-CM

## 2022-08-05 DIAGNOSIS — Z8639 Personal history of other endocrine, nutritional and metabolic disease: Secondary | ICD-10-CM | POA: Diagnosis not present

## 2022-08-05 LAB — POCT URINALYSIS DIP (PROADVANTAGE DEVICE)
Bilirubin, UA: NEGATIVE
Glucose, UA: NEGATIVE mg/dL
Ketones, POC UA: NEGATIVE mg/dL
Leukocytes, UA: NEGATIVE
Nitrite, UA: NEGATIVE
Protein Ur, POC: NEGATIVE mg/dL
Specific Gravity, Urine: 1.01
Urobilinogen, Ur: 0.2
pH, UA: 7.5 (ref 5.0–8.0)

## 2022-08-06 LAB — COMPREHENSIVE METABOLIC PANEL
ALT: 18 IU/L (ref 0–32)
AST: 19 IU/L (ref 0–40)
Albumin/Globulin Ratio: 2 (ref 1.2–2.2)
Albumin: 4.9 g/dL (ref 3.8–4.9)
Alkaline Phosphatase: 69 IU/L (ref 44–121)
BUN/Creatinine Ratio: 18 (ref 9–23)
BUN: 15 mg/dL (ref 6–24)
Bilirubin Total: 0.5 mg/dL (ref 0.0–1.2)
CO2: 28 mmol/L (ref 20–29)
Calcium: 10.2 mg/dL (ref 8.7–10.2)
Chloride: 101 mmol/L (ref 96–106)
Creatinine, Ser: 0.85 mg/dL (ref 0.57–1.00)
Globulin, Total: 2.4 g/dL (ref 1.5–4.5)
Glucose: 84 mg/dL (ref 70–99)
Potassium: 3.9 mmol/L (ref 3.5–5.2)
Sodium: 144 mmol/L (ref 134–144)
Total Protein: 7.3 g/dL (ref 6.0–8.5)
eGFR: 80 mL/min/{1.73_m2} (ref >59)

## 2022-08-06 LAB — LIPID PANEL
Chol/HDL Ratio: 3.4 ratio (ref 0.0–4.4)
Cholesterol, Total: 198 mg/dL (ref 100–199)
HDL: 58 mg/dL (ref >39)
LDL Chol Calc (NIH): 102 mg/dL — ABNORMAL HIGH (ref 0–99)
Triglycerides: 224 mg/dL — ABNORMAL HIGH (ref 0–149)
VLDL Cholesterol Cal: 38 mg/dL (ref 5–40)

## 2022-08-06 LAB — TSH: TSH: 1.53 u[IU]/mL (ref 0.450–4.500)

## 2022-09-20 ENCOUNTER — Ambulatory Visit
Admission: RE | Admit: 2022-09-20 | Discharge: 2022-09-20 | Disposition: A | Discharge status: Home / Self Care | Payer: 59 | Source: Ambulatory Visit | Attending: Family Medicine | Admitting: Family Medicine

## 2022-09-20 DIAGNOSIS — Z1231 Encounter for screening mammogram for malignant neoplasm of breast: Secondary | ICD-10-CM

## 2022-09-28 ENCOUNTER — Other Ambulatory Visit: Payer: Self-pay | Admitting: Family Medicine

## 2022-09-28 DIAGNOSIS — I10 Essential (primary) hypertension: Secondary | ICD-10-CM

## 2022-10-02 ENCOUNTER — Other Ambulatory Visit: Payer: Self-pay | Admitting: Family Medicine

## 2022-10-02 DIAGNOSIS — I10 Essential (primary) hypertension: Secondary | ICD-10-CM

## 2022-10-02 DIAGNOSIS — E782 Mixed hyperlipidemia: Secondary | ICD-10-CM

## 2022-12-26 ENCOUNTER — Other Ambulatory Visit: Payer: Self-pay | Admitting: Family Medicine

## 2022-12-26 DIAGNOSIS — I10 Essential (primary) hypertension: Secondary | ICD-10-CM

## 2022-12-30 ENCOUNTER — Other Ambulatory Visit: Payer: Self-pay | Admitting: Family Medicine

## 2022-12-30 DIAGNOSIS — E782 Mixed hyperlipidemia: Secondary | ICD-10-CM

## 2022-12-30 DIAGNOSIS — I10 Essential (primary) hypertension: Secondary | ICD-10-CM

## 2023-01-13 ENCOUNTER — Encounter: Payer: Self-pay | Admitting: *Deleted

## 2023-01-14 NOTE — Progress Notes (Signed)
No chief complaint on file.  Patient presents for 6 month follow-up on chronic problems.  Hypertension: She reports compliance in taking metoprolol and HCTZ. She denies side effects. No headaches, dizziness or chest pain. No edema or muscle cramps. (Previously took benazepril, switched to metoprolol when diagnosed with hyperthyroidism). BP's at home run  Pulse is usually  BP Readings from Last 3 Encounters:  08/05/22 130/80  01/03/22 118/76  07/02/21 120/74     Graves' disease.  She no longer sees endocrinologist.  She stopped methimazole in 04/2020 and her TSH has been normal, last check was normal in 07/2022  Lab Results  Component Value Date   TSH 1.530 08/05/2022     Hyperlipidemia follow-up:  Patient generally follows a low-fat, low cholesterol diet. Denies side effects from the Crestor. Also takes omega-3 fish oil  2 capsules daily. Her triglycerides were high on last check in January.  She had just returned from a trip to [facility] where she had eaten a lot of fast food. Due for recheck today Typical diet-- chicken, pork loin, [place].  Steam crisper, or air fried with avocado oil.  She eats a lot of vegetables.  Lab Results  Component Value Date   CHOL 198 08/05/2022   HDL 58 08/05/2022   LDLCALC 102 (H) 08/05/2022   TRIG 224 (H) 08/05/2022   CHOLHDL 3.4 08/05/2022    Obesity: Current diet:  As above. She tries to limit her carbs. She steam/air fries foods.  Some sweets, tries to limit. She drinks 3 hot teas/day, with 1 teaspoon of sugar.  Rare soda.  Exercise??  Wt Readings from Last 3 Encounters:  08/05/22 196 lb 12.8 oz (89.3 kg)  01/03/22 190 lb 6.4 oz (86.4 kg)  07/02/21 183 lb (83 kg)    PMH, PSH, SH reviewed    ROS: No fever, chills, URI symptoms, chest pain, shortness of breath, palpitations, edema.  No n/v/heartburn, diarrhea. Mild constipation, no blood in the stools. Moods are good. No rashes, easy bleeding,  bruising.  UPDATE_-constipation?   PHYSICAL EXAM:  There were no vitals taken for this visit.  Wt Readings from Last 3 Encounters:  08/05/22 196 lb 12.8 oz (89.3 kg)  01/03/22 190 lb 6.4 oz (86.4 kg)  07/02/21 183 lb (83 kg)    Well developed, pleasant female in no distress HEENT: Conjunctiva and sclera are clear, EOMI. OP clear. Neck: no lymphadenopathy, thyromegaly or mass, no bruit Heart: Mild bradycardia, regular rhythm, no murmur Lungs: clear bilaterally Abdomen: soft, nontender, no organomegaly or mass Back: no spinal or CVA tenderness Extremities: no edema, 2+ pulses Psych: normal mood, affect Neuro: alert and oriented, cranial nerves grossly intact. Normal gait  UPDATE ALL--bradycardia?***   ASSESSMENT/PLAN:     TSH only if sx (h/o Graves)  F/u as scheduled for CPE in 07/2023

## 2023-01-15 ENCOUNTER — Ambulatory Visit (INDEPENDENT_AMBULATORY_CARE_PROVIDER_SITE_OTHER): Payer: 59 | Admitting: Family Medicine

## 2023-01-15 ENCOUNTER — Encounter: Payer: Self-pay | Admitting: Family Medicine

## 2023-01-15 VITALS — BP 130/80 | HR 76 | Ht 63.0 in | Wt 198.6 lb

## 2023-01-15 DIAGNOSIS — Z8639 Personal history of other endocrine, nutritional and metabolic disease: Secondary | ICD-10-CM | POA: Diagnosis not present

## 2023-01-15 DIAGNOSIS — E782 Mixed hyperlipidemia: Secondary | ICD-10-CM | POA: Diagnosis not present

## 2023-01-15 DIAGNOSIS — E6609 Other obesity due to excess calories: Secondary | ICD-10-CM

## 2023-01-15 DIAGNOSIS — Z6835 Body mass index (BMI) 35.0-35.9, adult: Secondary | ICD-10-CM

## 2023-01-15 DIAGNOSIS — I10 Essential (primary) hypertension: Secondary | ICD-10-CM

## 2023-01-15 MED ORDER — HYDROCHLOROTHIAZIDE 12.5 MG PO CAPS: 12.5000 mg | ORAL_CAPSULE | Freq: Every day | ORAL | 1 refills | Status: DC

## 2023-01-15 MED ORDER — ROSUVASTATIN CALCIUM 40 MG PO TABS: 40.0000 mg | ORAL_TABLET | Freq: Every day | ORAL | 1 refills | Status: DC

## 2023-01-15 MED ORDER — WEGOVY 0.25 MG/0.5ML ~~LOC~~ SOAJ: 0.2500 mg | SUBCUTANEOUS | 0 refills | Status: DC

## 2023-01-15 NOTE — Patient Instructions (Signed)
We discussed various weight loss medications, specifically Wegovy and the potential risks and side effects. We briefly mentioned the oral medications of Qsymia and Contrave (in case they require an oral medication prior to approving the injectable). We also discussed the possibility of switching your weight loss journey over to the [facility] clinic--the close oversight and accountability that it offers, in addition to a lot of nutrition education and also use of medications, may be a good fit for you. There are clinics in [place] and [facility] lifestyle center?). Feel free to research these and let me know if you need a referral.

## 2023-01-16 LAB — LIPID PANEL
Chol/HDL Ratio: 3 ratio (ref 0.0–4.4)
Cholesterol, Total: 138 mg/dL (ref 100–199)
HDL: 46 mg/dL (ref >39)
LDL Chol Calc (NIH): 58 mg/dL (ref 0–99)
Triglycerides: 210 mg/dL — ABNORMAL HIGH (ref 0–149)
VLDL Cholesterol Cal: 34 mg/dL (ref 5–40)

## 2023-01-21 ENCOUNTER — Telehealth: Payer: Self-pay | Admitting: Internal Medicine

## 2023-01-21 NOTE — Telephone Encounter (Signed)
P.A wegovy sent through covermymeds. Waiting on response 

## 2023-01-21 NOTE — Telephone Encounter (Signed)
Please let her know that it has been approved, and have her schedule a f/u visit 3 weeks after starting it. That way we can see how she is doing on it and likely increase the dose.

## 2023-01-21 NOTE — Telephone Encounter (Signed)
Thanks.  I'm sure it will not get covered, but we need to know what steps are required to try before being eligible (like which oral meds she needs to try first, etc).  So, please make sure the denial information is sent to me so I know how next to proceed.

## 2023-01-21 NOTE — Telephone Encounter (Signed)
This actually has been approved just now

## 2023-01-22 NOTE — Telephone Encounter (Signed)
Pt was notified that medication was approved. Faxed over approval to pharmacy. Pt will call back after starting medication to schedule. She wants to make sure its not expensive.

## 2023-02-05 ENCOUNTER — Encounter: Payer: 59 | Admitting: Family Medicine

## 2023-03-25 ENCOUNTER — Other Ambulatory Visit: Payer: Self-pay | Admitting: Family Medicine

## 2023-03-25 DIAGNOSIS — I10 Essential (primary) hypertension: Secondary | ICD-10-CM

## 2023-07-24 ENCOUNTER — Other Ambulatory Visit: Payer: Self-pay | Admitting: Family Medicine

## 2023-07-24 DIAGNOSIS — I10 Essential (primary) hypertension: Secondary | ICD-10-CM

## 2023-07-24 DIAGNOSIS — E782 Mixed hyperlipidemia: Secondary | ICD-10-CM

## 2023-08-10 NOTE — Progress Notes (Unsigned)
No chief complaint on file.  Sherry Huff is a 58 y.o. female who presents for a complete physical and follow-up on chronic problems.    Obesity: She was last seen here in June, at which time she asked about starting [facility] for weight loss.  This was authorized in early July, and she was supposed to contact us after starting medication, to set up a 3 week f/u visit. At June visit, she reported having mini-ice cream sandwiches nightly, only rare soda, cut back to 1 cup of tea/d (with 1 tsp of sugar or honey). She tries to limit her carbs, steams/air fries foods. (See lipid section below). Sweets include mini-ice cream sandwiches nightly.  Exercise--walks a mile with the dog in the morning.  Uses infinity hoop, and mini-stepper.  Total exercise time is 30 minutes daily, 5 days/week.   Wt Readings from Last 3 Encounters:  01/15/23 198 lb 9.6 oz (90.1 kg)  08/05/22 196 lb 12.8 oz (89.3 kg)  01/03/22 190 lb 6.4 oz (86.4 kg)  12/2021 Wt 190# 6.4 oz   06/2021 Wt 183#  09/2020 Wt 172# 06/2020 Wt 167# 05/2019 Wt 203# 3.2 oz 10/2017 Wt 179#  Hypertension: She reports compliance in taking metoprolol and HCTZ. She denies side effects. No headaches, dizziness or chest pain. No edema or muscle cramps. (Previously took benazepril, switched to metoprolol when diagnosed with hyperthyroidism). BP's are running ***   BP Readings from Last 3 Encounters:  01/15/23 130/80  08/05/22 130/80  01/03/22 118/76    Graves' disease.  She no longer sees endocrinologist.  She stopped methimazole in 04/2020 and her TSH has been normal, last check was normal in 07/2022. She denies changes to hair/skin/nails/bowels. In June her moods were a little worse, related to sister-in-law being sick, concerns about weight gain, other stressors.   Lab Results  Component Value Date   TSH 1.530 08/05/2022     Hyperlipidemia follow-up:  Patient generally follows a low-fat, low cholesterol diet. Denies side effects from the  Crestor. Also takes omega-3 fish oil  2 capsules daily. LDL and TG were higher last January, after a trip from [facility] with more fast food. Recheck in June showed improved LDL, TG slightly lower, but still above goal.  Typical diet-- chicken, pork loin, [place].  Steam crisper, or air fried with avocado oil.  She eats a lot of vegetables. Occasional fast food. Goes out once a week, eats sushi.   Drinks mainly water. No sugary beverages. Eats an ice cream sandiwich every night ("mini").   Lab Results  Component Value Date   CHOL 138 01/15/2023   HDL 46 01/15/2023   LDLCALC 58 01/15/2023   TRIG 210 (H) 01/15/2023   CHOLHDL 3.0 01/15/2023     Mild osteopenia per DEXA (T-1.5 at R fem neck) in 11/2021. She is taking Calcium with D.  Weight-bearing exercise? ***  wall pilates?   H/o Vitamin D deficiency: Level was normal at 37 in 12/2021 when taking MVI and Calcium with D daily. In 2018 had been low at 17 (when not taking MVI).   She continues on the same MVI daily, and taking calcium with D once daily.   Immunization History  Administered Date(s) Administered   Influenza Split 06/04/2005, 06/19/2006, 04/25/2008   Influenza,inj,Quad PF,6+ Mos 08/01/2014, 05/31/2015, 05/26/2019, 06/21/2020, 07/02/2021, 08/05/2022   PFIZER(Purple Top)SARS-COV-2 Vaccination 12/29/2019, 01/19/2020   Pneumococcal Polysaccharide-23 10/19/2012   Td 03/06/1995, 06/04/2005   Tdap 10/19/2012, 08/05/2022   +varicella titer 06/2020 Last Pap smear:  11/2009; no abnormal paps; had hysterectomy for benign reasons Last mammogram: 13/2024 Last colonoscopy: 03/2017, had tubular adenoma, changed to 7 yr f/u, now due 03/2024 (see letter to pt from Dr. Myrtie Neither) Last DEXA: 11/2021, T-1.5 at R fem neck   Dentist:  every 6 months Ophtho: Past due. Noted to have borderline pressures in the past.  Exercise:   using a stepper 2-4x/week x 20 minutes for the last 6 months. Does wall pilates 3x/week.   PMH, PSH, SH and FH were  reviewed and updated    ROS: The patient denies anorexia, fever, vision changes, decreased hearing, ear pain, sore throat, breast concerns, chest pain, palpitations, dizziness, syncope, dyspnea on exertion, cough, swelling, nausea, vomiting, diarrhea, constipation, abdominal pain, melena, hematochezia, indigestion/heartburn, hematuria, incontinence, dysuria, vaginal bleeding, discharge, odor, genital lesions, joint pains, numbness, tingling, weakness, tremor, suspicious skin lesions, depression, anxiety, abnormal bleeding/bruising, or enlarged lymph nodes.   Ears itch intermittently (bilaterally) Mild hot flashes, only very occasional (less than in the past) Infrequent indigestion, occasionally uses 2 week prilosec OTC course. Very rare urinary leakage (sneeze with a full bladder). Occasional hand/finger swelling (rings sometimes are tight).    PHYSICAL EXAM:  There were no vitals taken for this visit.  Wt Readings from Last 3 Encounters:  01/15/23 198 lb 9.6 oz (90.1 kg)  08/05/22 196 lb 12.8 oz (89.3 kg)  01/03/22 190 lb 6.4 oz (86.4 kg)  12/2021 Wt 190# 6.4 oz   06/2021 Wt 183#  09/2020 Wt 172# 06/2020 Wt 167# 05/2019 Wt 203# 3.2 oz 10/2017 Wt 179#  General Appearance:   Alert, cooperative, no distress, appears stated age    Head:   Normocephalic, without obvious abnormality, atraumatic    Eyes:   PERRL, conjunctiva/corneas clear, EOM's intact, fundi benign. No exophthlamos  Ears:   Normal TM's and external ear canals    Nose:   No drainage or sinus tenderness  Throat:   Normal mucosa  Neck:   Supple, no lymphadenopathy; thyroid: no enlargement/ tenderness/nodules; no carotid bruit or JVD.   Back:   Spine nontender, no curvature, ROM normal, no CVA tenderness.   Lungs:   Clear to auscultation bilaterally without rales or ronchi;   respirations unlabored    Chest Wall:   No tenderness or deformity    Heart:   Regular rate and rhythm, S1 and S2 normal, no murmur, rub or gallop     Breast Exam:   No tenderness, masses, or nipple discharge or inversion. No axillary lymphadenopathy.   Abdomen:   Soft, non-tender, nondistended, normoactive bowel sounds, no masses, no hepatosplenomegaly    Genitalia:   External genitalia: Normal, no lesions. BUS and vagina normal.  Uterus is surgically absent, adnexa not enlarged, nontender, no masses. Pap not performed.   Rectal:   Normal sphincter tone, no masses; heme negative stool    Extremities:   No clubbing, cyanosis or edema.   Pulses:   2+ and symmetric all extremities    Skin:   Skin color, texture, turgor normal, no lesions or rash  Lymph nodes:   Cervical, supraclavicular, inguinal and axillary nodes normal    Neurologic:   Normal strength, sensation and gait; reflexes 2+ and symmetric throughout                        Psych:  Normal mood, affect, hygiene and grooming   ASSESSMENT/PLAN:  Consider adding D  --h/o vit D deficiency, on lower end in 12/2021  Enter/offer/decline  flu/COVID Shingrix rec  Looks like we got prior auth for [place] in July--did she ever start? Was supposed to contact us for f/u appt when she started.    Discussed monthly self breast exams and yearly mammograms; at least 30 minutes of aerobic activity at least 5 days/week, weight-bearing exercise at least 2x/wk; proper sunscreen use reviewed; healthy diet, including goals of calcium and vitamin D intake and alcohol recommendations (less than or equal to 1 drink/day) reviewed; regular seatbelt use; changing batteries in smoke detectors. Immunization recommendations discussed--flu shot Bivalent COVID booster recommended, declined.  Shingrix recommended, risks/SE reviewed, pt "not ready" for this vaccine yet. ***UPDATE Colonoscopy recommendations reviewed--UTD, repeat is due 03/2024   F/u 1 year for CPE ?sooner for wt or 6 month med check??

## 2023-08-10 NOTE — Patient Instructions (Incomplete)

## 2023-08-11 ENCOUNTER — Encounter: Payer: Self-pay | Admitting: Family Medicine

## 2023-08-11 ENCOUNTER — Ambulatory Visit: Payer: 59 | Admitting: Family Medicine

## 2023-08-11 VITALS — BP 130/70 | HR 80 | Ht 63.0 in | Wt 174.8 lb

## 2023-08-11 DIAGNOSIS — Z8639 Personal history of other endocrine, nutritional and metabolic disease: Secondary | ICD-10-CM | POA: Diagnosis not present

## 2023-08-11 DIAGNOSIS — E66812 Obesity, class 2: Secondary | ICD-10-CM | POA: Diagnosis not present

## 2023-08-11 DIAGNOSIS — E782 Mixed hyperlipidemia: Secondary | ICD-10-CM | POA: Diagnosis not present

## 2023-08-11 DIAGNOSIS — E6609 Other obesity due to excess calories: Secondary | ICD-10-CM

## 2023-08-11 DIAGNOSIS — Z6835 Body mass index (BMI) 35.0-35.9, adult: Secondary | ICD-10-CM

## 2023-08-11 DIAGNOSIS — E559 Vitamin D deficiency, unspecified: Secondary | ICD-10-CM

## 2023-08-11 DIAGNOSIS — I10 Essential (primary) hypertension: Secondary | ICD-10-CM | POA: Diagnosis not present

## 2023-08-11 DIAGNOSIS — Z0000 Encounter for general adult medical examination without abnormal findings: Secondary | ICD-10-CM

## 2023-08-11 DIAGNOSIS — Z23 Encounter for immunization: Secondary | ICD-10-CM | POA: Diagnosis not present

## 2023-08-11 DIAGNOSIS — M85851 Other specified disorders of bone density and structure, right thigh: Secondary | ICD-10-CM | POA: Insufficient documentation

## 2023-08-11 MED ORDER — METOPROLOL SUCCINATE ER 100 MG PO TB24: 100.0000 mg | ORAL_TABLET | Freq: Every day | ORAL | 1 refills | Status: DC

## 2023-08-11 MED ORDER — WEGOVY 1 MG/0.5ML ~~LOC~~ SOAJ: 1.0000 mg | SUBCUTANEOUS | 1 refills | Status: DC

## 2023-08-11 NOTE — Assessment & Plan Note (Signed)
Reviewed calcium recommendations, calcium-rich foods, vitamin D and weight-bearing exercise.  Can wait 3 years for f/u DEXA (2026)

## 2023-08-11 NOTE — Assessment & Plan Note (Signed)
Due for thyroid screen. Clinically euthyroid.

## 2023-08-11 NOTE — Assessment & Plan Note (Signed)
Doing well on semaglutide, on dose of 1mg  x 6 weeks.  She declined increase to 1.7mg  dose.  Discussed the possible NEED in the future to continue titrating up, if insurance coverage an issue (may not cover prolonged 1mg  dose). F/u in 2 mos to assess response and need for increase

## 2023-08-11 NOTE — Assessment & Plan Note (Signed)
Blood pressure is well controlled. She tapered down metoprolol dose on her own to 100mg , continues on hydrochlorothiazide.  BP is well controlled on this regimen. We reviewed low sodium diet and exercise recommendations, along with continued weight loss.

## 2023-08-11 NOTE — Assessment & Plan Note (Signed)
Recheck today, given that last level was on the lower end of normal during warmer month. Currently taking MVI, and calcium with D once daily.  If low, may need an additional D3 daily

## 2023-08-11 NOTE — Assessment & Plan Note (Signed)
Reviewed lowfat, low cholesterol diet. Compliant with rosuvastatin and 2 fish oil daily, due for recheck

## 2023-08-12 ENCOUNTER — Encounter: Payer: Self-pay | Admitting: Family Medicine

## 2023-08-12 LAB — LIPID PANEL
Chol/HDL Ratio: 2.6 {ratio} (ref 0.0–4.4)
Cholesterol, Total: 139 mg/dL (ref 100–199)
HDL: 53 mg/dL (ref >39)
LDL Chol Calc (NIH): 58 mg/dL (ref 0–99)
Triglycerides: 170 mg/dL — ABNORMAL HIGH (ref 0–149)
VLDL Cholesterol Cal: 28 mg/dL (ref 5–40)

## 2023-08-12 LAB — CMP14+EGFR
ALT: 23 [IU]/L (ref 0–32)
AST: 23 [IU]/L (ref 0–40)
Albumin: 5.1 g/dL — ABNORMAL HIGH (ref 3.8–4.9)
Alkaline Phosphatase: 62 [IU]/L (ref 44–121)
BUN/Creatinine Ratio: 13 (ref 9–23)
BUN: 12 mg/dL (ref 6–24)
Bilirubin Total: 0.7 mg/dL (ref 0.0–1.2)
CO2: 31 mmol/L — ABNORMAL HIGH (ref 20–29)
Calcium: 10.1 mg/dL (ref 8.7–10.2)
Chloride: 97 mmol/L (ref 96–106)
Creatinine, Ser: 0.92 mg/dL (ref 0.57–1.00)
Globulin, Total: 2.3 g/dL (ref 1.5–4.5)
Glucose: 89 mg/dL (ref 70–99)
Potassium: 3.7 mmol/L (ref 3.5–5.2)
Sodium: 141 mmol/L (ref 134–144)
Total Protein: 7.4 g/dL (ref 6.0–8.5)
eGFR: 73 mL/min/{1.73_m2} (ref >59)

## 2023-08-12 LAB — VITAMIN D 25 HYDROXY (VIT D DEFICIENCY, FRACTURES): Vit D, 25-Hydroxy: 42.3 ng/mL (ref 30.0–100.0)

## 2023-08-12 LAB — TSH: TSH: 1.81 u[IU]/mL (ref 0.450–4.500)

## 2023-08-13 ENCOUNTER — Other Ambulatory Visit (HOSPITAL_COMMUNITY): Payer: Self-pay

## 2023-08-13 ENCOUNTER — Telehealth: Payer: Self-pay

## 2023-08-13 ENCOUNTER — Other Ambulatory Visit: Payer: Self-pay | Admitting: Family Medicine

## 2023-08-13 DIAGNOSIS — I10 Essential (primary) hypertension: Secondary | ICD-10-CM

## 2023-08-13 NOTE — Telephone Encounter (Signed)
Pharmacy Patient Advocate Encounter   Received notification from CoverMyMeds that prior authorization for Wegovy 1MG  is required/requested.   Insurance verification completed.   The patient is insured through [facility] .   Per test claim: PA required; PA submitted to above mentioned insurance via CoverMyMeds Key/confirmation #/EOC (Key: [id number])     Status is pending

## 2023-08-14 ENCOUNTER — Other Ambulatory Visit (HOSPITAL_COMMUNITY): Payer: Self-pay

## 2023-08-14 NOTE — Telephone Encounter (Signed)
Pharmacy Patient Advocate Encounter  Received notification from [facility] that Prior Authorization for Reginal Lutes has been APPROVED from 1.22.25 to 1.22.26. Ran test claim, Copay is $RTS AND IS PAYABLE AGAIN ON/AFTER 09/03/23. This test claim was processed through [facility]- copay amounts may vary at other pharmacies due to pharmacy/plan contracts, or as the patient moves through the different stages of their insurance plan.

## 2023-10-09 ENCOUNTER — Other Ambulatory Visit: Payer: Self-pay | Admitting: Family Medicine

## 2023-10-09 DIAGNOSIS — Z1231 Encounter for screening mammogram for malignant neoplasm of breast: Secondary | ICD-10-CM

## 2023-10-14 NOTE — Progress Notes (Unsigned)
 No chief complaint on file.  Patient presents for 2 month follow-up on her weight/obesity treatment.  She started on compounded semaglutide in Fall of 2024.  At the time of her physical in January she had been on the 1mg  dose for 6 weeks and was doing well. Her insurance changed, so we prescribed Wegovy 1mg  at that visit--she had been losing weight, had some side effects (nausea/vomiting) when she made bad food choices. She preferred to stay on the 1mg  dose rather than increase to 1.7 mg. Constipation is managed with fiber tablets.   She now only drinks water (no soda or tea).  She still isn't eating ice cream sandwiches. She is eating more fruit. Has some sugar-free jello with fruit as a sweet snack. Continues to limit carbs (very small portions, a bite or two, of what she makes for her husband). She is eating much smaller portions.  Typical diet-- chicken, pork loin, [place].  Steam crisper, or air fried with avocado oil.   She eats a lot of vegetables. Only rare fast food ([employer]--fried chicken sandwich, eats half, no fries).  Exercise--***UPDATE Walking a mild with dog in the morning ?? (Had walked less due to the weather at her physical). Infinity hoop and mini-stepper   Uses handweights 3x/week.   Wt Readings from Last 3 Encounters:  08/11/23 174 lb 12.8 oz (79.3 kg)  01/15/23 198 lb 9.6 oz (90.1 kg)  08/05/22 196 lb 12.8 oz (89.3 kg)  08/05/22 196 lb 12.8 oz (89.3 kg) 12/2021 Wt 190# 6.4 oz   06/2021 Wt 183#  09/2020 Wt 172# 06/2020 Wt 167# 05/2019 Wt 203# 3.2 oz 10/2017 Wt 179#   Hypertension: She reports compliance in taking metoprolol and HCTZ. She denies side effects.  She decreased the dose of metoprolol from 150 to 100mg  around October 2024--had lost weight on semaglutide and was feeling dizzy.   She continues to do well on this dose.  She denies headaches, dizziness or chest pain. No edema or muscle cramps.  (Previously took benazepril, switched to metoprolol when  diagnosed with hyperthyroidism).  BP's are no longer checked at home, needs a new monitor.   BP Readings from Last 3 Encounters:  08/11/23 130/70  01/15/23 130/80  08/05/22 130/80      PMH, PSH, SH reviewed   ROS: No fever, chills, URI symptoms, chest pain, shortness of breath, palpitations, edema.  No n/v/heartburn, diarrhea. Mild constipation, controlled.  No blood in the stools. No rashes, easy bleeding, bruising. No changes to energy, hair/skin/nails. Moods are good. +intentional weight loss    PHYSICAL EXAM:  There were no vitals taken for this visit.  Wt Readings from Last 3 Encounters:  08/11/23 174 lb 12.8 oz (79.3 kg)  01/15/23 198 lb 9.6 oz (90.1 kg)  08/05/22 196 lb 12.8 oz (89.3 kg)   Well developed, pleasant female in no distress HEENT: Conjunctiva and sclera are clear, EOMI.  Neck: no lymphadenopathy, thyromegaly or mass, no tbruit. Heart: regular rate and rhythm, no murmur Lungs: clear bilaterally Abdomen: soft, nontender, no organomegaly or mass Back: no spinal or CVA tenderness Extremities: no edema, 2+ pulses Psych: normal mood, affect Neuro: alert and oriented, cranial nerves grossly intact. Normal gait    ASSESSMENT/PLAN:  Lauretta Grill RF--likely need to increase to 1.7mg  (not sure how long they let them stay at 1mg --isn't covered longterm at that dose). Would rec increase if no SE Needs RFs If losing a lot of weight and wants to stay at 1mg , we  can always TRY and see if they'll continue to pay for 1 mg RF

## 2023-10-15 ENCOUNTER — Encounter: Payer: Self-pay | Admitting: Family Medicine

## 2023-10-15 ENCOUNTER — Ambulatory Visit: Payer: 59 | Admitting: Family Medicine

## 2023-10-15 VITALS — BP 128/72 | HR 68 | Ht 63.0 in | Wt 171.0 lb

## 2023-10-15 DIAGNOSIS — Z23 Encounter for immunization: Secondary | ICD-10-CM

## 2023-10-15 DIAGNOSIS — E66812 Obesity, class 2: Secondary | ICD-10-CM | POA: Diagnosis not present

## 2023-10-15 DIAGNOSIS — Z6835 Body mass index (BMI) 35.0-35.9, adult: Secondary | ICD-10-CM

## 2023-10-15 DIAGNOSIS — K219 Gastro-esophageal reflux disease without esophagitis: Secondary | ICD-10-CM | POA: Diagnosis not present

## 2023-10-15 DIAGNOSIS — I10 Essential (primary) hypertension: Secondary | ICD-10-CM

## 2023-10-15 DIAGNOSIS — E66811 Obesity, class 1: Secondary | ICD-10-CM

## 2023-10-15 DIAGNOSIS — E6609 Other obesity due to excess calories: Secondary | ICD-10-CM

## 2023-10-15 MED ORDER — WEGOVY 1.7 MG/0.75ML ~~LOC~~ SOAJ: 1.7000 mg | SUBCUTANEOUS | 2 refills | Status: DC

## 2023-10-15 NOTE — Patient Instructions (Signed)
 Contact us if you aren't tolerating the 1.7 mg dose (we can see if the 1 mg dose will be covered). If you are doing well on the 1.7 mg dose, but aren't losing much weight, we can further increase to the maximum dose of 2.4 mg in 4 weeks (or longer).  I suspect that reflux may contribute to some of your vomiting (ie after the tangerine today). Consider taking pepcid once or twice daily  (especially prior to meals that night trigger heartburn/reflux).

## 2023-10-21 ENCOUNTER — Other Ambulatory Visit: Payer: Self-pay | Admitting: Family Medicine

## 2023-10-21 DIAGNOSIS — E782 Mixed hyperlipidemia: Secondary | ICD-10-CM

## 2023-10-21 DIAGNOSIS — I10 Essential (primary) hypertension: Secondary | ICD-10-CM

## 2023-10-24 ENCOUNTER — Ambulatory Visit
Admission: RE | Admit: 2023-10-24 | Discharge: 2023-10-24 | Disposition: A | Discharge status: Home / Self Care | Source: Ambulatory Visit | Attending: Family Medicine | Admitting: Family Medicine

## 2023-10-24 DIAGNOSIS — Z1231 Encounter for screening mammogram for malignant neoplasm of breast: Secondary | ICD-10-CM

## 2024-01-06 ENCOUNTER — Other Ambulatory Visit: Payer: Self-pay | Admitting: Family Medicine

## 2024-01-18 ENCOUNTER — Other Ambulatory Visit: Payer: Self-pay | Admitting: Family Medicine

## 2024-01-18 DIAGNOSIS — I10 Essential (primary) hypertension: Secondary | ICD-10-CM

## 2024-01-18 DIAGNOSIS — E782 Mixed hyperlipidemia: Secondary | ICD-10-CM

## 2024-01-20 NOTE — Progress Notes (Unsigned)
 No chief complaint on file.  Patient presents for 3 month follow-up on her weight/obesity treatment.  She started on compounded semaglutide  in Fall of 2024.  Switched to Wegovy  1mg  at her physical (insurance had changed).  She elected to stay on the 1 mg dose for longer due to some SE (n/v).  Her dose was increased to 1.7 mg at her last visit in 09/2023. ***SE?? Constipation is managed with fiber tablets.  Today she reports-- Diet hasn't changed--She now only drinks water (no soda or tea).  She still isn't eating ice cream sandwiches. She is eating more fruit. Has some sugar-free jello with fruit as a sweet snack. Continues to limit carbs (very small portions, a bite or two, of what she makes for her husband). She is eating much smaller portions.  Typical diet-- chicken, pork loin, [place].  Steam crisper, or air fried with avocado oil.   She eats a lot of vegetables. Only rare fast food ([employer]--fried chicken sandwich, eats half, no fries).   Wt Readings from Last 3 Encounters:  10/15/23 171 lb (77.6 kg)  08/11/23 174 lb 12.8 oz (79.3 kg)  01/15/23 198 lb 9.6 oz (90.1 kg)  01/15/23 198 lb 9.6 oz (90.1 kg) 08/05/22 196 lb 12.8 oz (89.3 kg) 12/2021 Wt 190# 6.4 oz   06/2021 Wt 183#  09/2020 Wt 172# 06/2020 Wt 167# 05/2019 Wt 203# 3.2 oz 10/2017 Wt 179#   Hypertension: She reports compliance in taking metoprolol  and HCTZ. She denies side effects.  She decreased the dose of metoprolol  from 150 to 100mg  around October 2024--had lost weight on semaglutide  and was feeling dizzy.   She continues to do well on the 100 mg dose.  She denies headaches, dizziness or chest pain. No edema or muscle cramps.  (Previously took benazepril , switched to metoprolol  when diagnosed with hyperthyroidism).  BP's are no longer checked at home, needs a new monitor.   BP Readings from Last 3 Encounters:  10/15/23 128/72  08/11/23 130/70  01/15/23 130/80      PMH, PSH, SH reviewed     ROS: No  fever, chills, URI symptoms, chest pain, shortness of breath, palpitations, edema.  No heartburn, diarrhea. Mild constipation, controlled.   No nausea, vomiting, heartburn. No urinary complaints. No rashes, easy bleeding, bruising. No changes to energy, hair/skin/nails. Moods are good. +intentional weight loss   PHYSICAL EXAM:  There were no vitals taken for this visit.  Wt Readings from Last 3 Encounters:  10/15/23 171 lb (77.6 kg)  08/11/23 174 lb 12.8 oz (79.3 kg)  01/15/23 198 lb 9.6 oz (90.1 kg)   Well developed, pleasant female in no distress HEENT: Conjunctiva and sclera are clear, EOMI.  Neck: no lymphadenopathy, thyromegaly or mass, no bruit. Heart: regular rate and rhythm, no murmur Lungs: clear bilaterally Abdomen: soft, nontender, no organomegaly or mass Back: no spinal or CVA tenderness Extremities: no edema, 2+ pulses Psych: normal mood, affect Neuro: alert and oriented, cranial nerves grossly intact. Normal gait    ASSESSMENT/PLAN:   RF Wegovy --1.7 or 2.4??  F/u as scheduled for CPE 08/2024

## 2024-01-21 ENCOUNTER — Encounter: Payer: Self-pay | Admitting: Family Medicine

## 2024-01-21 ENCOUNTER — Ambulatory Visit: Admitting: Family Medicine

## 2024-01-21 VITALS — BP 118/78 | HR 72 | Ht 63.0 in | Wt 160.4 lb

## 2024-01-21 DIAGNOSIS — E66812 Obesity, class 2: Secondary | ICD-10-CM

## 2024-01-21 DIAGNOSIS — I10 Essential (primary) hypertension: Secondary | ICD-10-CM | POA: Diagnosis not present

## 2024-01-21 DIAGNOSIS — K219 Gastro-esophageal reflux disease without esophagitis: Secondary | ICD-10-CM

## 2024-01-21 DIAGNOSIS — Z6835 Body mass index (BMI) 35.0-35.9, adult: Secondary | ICD-10-CM

## 2024-01-21 MED ORDER — WEGOVY 1.7 MG/0.75ML ~~LOC~~ SOAJ: 1.7000 mg | SUBCUTANEOUS | 5 refills | Status: AC

## 2024-01-21 MED ORDER — METOPROLOL SUCCINATE ER 100 MG PO TB24: 100.0000 mg | ORAL_TABLET | Freq: Every day | ORAL | 1 refills | Status: AC

## 2024-01-21 NOTE — Patient Instructions (Signed)
 Continue on the semaglutide  1.7 mg weekly. Continue to get regular exercise, eat a lot of protein, and use weights to build and maintain muscle mass in your arms and legs.  If you start getting too skinny (losing too much weight), you can start spreading out the doses (ie every 10 days rather than weekly). We eventually can lower the dose as well.  Contact us  if your blood pressures are dropping, if you have dizziness. Your current blood pressure is very well controlled on these medications. If you see <100/50 or you're frequently dizzy, we can cut the doses.  Contact us  or follow up sooner than your February appointment if having any issues or concerns.

## 2024-04-17 ENCOUNTER — Other Ambulatory Visit: Payer: Self-pay | Admitting: Family Medicine

## 2024-04-17 DIAGNOSIS — E782 Mixed hyperlipidemia: Secondary | ICD-10-CM

## 2024-04-17 DIAGNOSIS — I10 Essential (primary) hypertension: Secondary | ICD-10-CM

## 2024-04-18 ENCOUNTER — Emergency Department
Admission: EM | Admit: 2024-04-18 | Discharge: 2024-04-18 | Disposition: A | Discharge status: Home / Self Care | Attending: Emergency Medicine | Admitting: Emergency Medicine

## 2024-04-18 ENCOUNTER — Encounter: Payer: Self-pay | Admitting: Emergency Medicine

## 2024-04-18 ENCOUNTER — Other Ambulatory Visit: Payer: Self-pay

## 2024-04-18 DIAGNOSIS — K029 Dental caries, unspecified: Secondary | ICD-10-CM | POA: Diagnosis not present

## 2024-04-18 DIAGNOSIS — K047 Periapical abscess without sinus: Secondary | ICD-10-CM | POA: Diagnosis not present

## 2024-04-18 DIAGNOSIS — I10 Essential (primary) hypertension: Secondary | ICD-10-CM | POA: Insufficient documentation

## 2024-04-18 DIAGNOSIS — K0889 Other specified disorders of teeth and supporting structures: Secondary | ICD-10-CM | POA: Diagnosis present

## 2024-04-18 MED ORDER — CLINDAMYCIN HCL 300 MG PO CAPS: 300.0000 mg | ORAL_CAPSULE | Freq: Three times a day (TID) | ORAL | 0 refills | Status: AC

## 2024-04-18 MED ORDER — CHLORHEXIDINE GLUCONATE 0.12 % MT SOLN: 15.0000 mL | Freq: Two times a day (BID) | OROMUCOSAL | 0 refills | Status: AC

## 2024-04-18 NOTE — ED Triage Notes (Signed)
 Patient to ED via POV for dental pain. Ongoing for a week. PT states woke up this AM with swelling down into neck. NAD and speaking in full sentences without difficulty. Sent by dentist for antibiotics.

## 2024-04-18 NOTE — ED Notes (Signed)
 See triage note  Presents with some dental pain  Denies any injury  Provider in on arrival

## 2024-04-18 NOTE — Discharge Instructions (Signed)
 Please follow-up with your dentist.  You may also take the antibiotics as prescribed.  Please return for any new, worsening, or change in symptoms or other concerns.  It was a pleasure caring for you today.

## 2024-04-18 NOTE — ED Provider Notes (Signed)
 [facility] Provider Note    Event Date/Time   First MD Initiated Contact with Patient 04/18/24 1025     (approximate)   History   Dental Pain   HPI  Sherry Huff is a 58 y.o. female who presents today for evaluation of left lower dental pain.  Patient reports that she has a known cavity that she was post to get a crown on but has not.  She reports that she began to have swelling along her jaw.  No voice change.  No difficulty swallowing.  No voice change.  No fevers or chills.  She called the dentist this morning who advised that she come get antibiotics.  She did not actually see the dentist.  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 (SLAP) tear of right shoulder    Graves disease 01/26/2019   Hyperthyroidism 12/01/2018   Vitamin D  deficiency 11/07/2016   Hypokalemia 11/07/2016   Obesity, Class II, BMI 35-39.9, with comorbidity (HCC) 09/11/2015   Essential hypertension, benign 10/19/2012   Hyperlipidemia 06/18/2011   Encounter for long-term (current) use of other medications 06/18/2011   Need for prophylactic vaccination and inoculation against influenza 06/18/2011          Physical Exam   Triage Vital Signs: ED Triage Vitals [04/18/24 1020]  Encounter Vitals Group     BP (!) 160/87     Girls Systolic BP Percentile      Girls Diastolic BP Percentile      Boys Systolic BP Percentile      Boys Diastolic BP Percentile      Pulse Rate 66     Resp 17     Temp 98.3 F (36.8 C)     Temp Source Oral     SpO2 100 %     Weight 155 lb (70.3 kg)     Height 5' 4 (1.626 m)     Head Circumference      Peak Flow      Pain Score 2      Pain Loc      Pain Education      Exclude from Growth Chart     Most recent vital signs: Vitals:   04/18/24 1020  BP: (!) 160/87  Pulse: 66  Resp: 17  Temp: 98.3 F (36.8 C)  SpO2: 100%    Physical Exam Vitals and nursing note reviewed.  Constitutional:      General: Awake and alert. No acute distress.    Appearance: Normal appearance. The patient is normal weight.  HENT:     Head: Normocephalic and atraumatic.     Mouth: Mucous membranes are moist.  Tooth #29 with obvious decay.  No gingival fluctuance.  Very minimal swelling noted along the jaw, no neck or facial swelling or erythema.  No sublingual swelling.  No voice change.  No trismus.  No difficulty handling secretions. Eyes:     General: PERRL. Normal EOMs        Right eye: No discharge.        Left eye: No discharge.     Conjunctiva/sclera: Conjunctivae normal.  Cardiovascular:     Rate and Rhythm:  Normal rate and regular rhythm.     Pulses: Normal pulses.  Pulmonary:     Effort: Pulmonary effort is normal. No respiratory distress.     Breath sounds: Normal breath sounds.  Abdominal:     Abdomen is soft. There is no abdominal tenderness. No rebound or guarding. No distention. Musculoskeletal:        General: No swelling. Normal range of motion.     Cervical back: Normal range of motion and neck supple.  Skin:    General: Skin is warm and dry.     Capillary Refill: Capillary refill takes less than 2 seconds.     Findings: No rash.  Neurological:     Mental Status: The patient is awake and alert.      ED Results / Procedures / Treatments   Labs (all labs ordered are listed, but only abnormal results are displayed) Labs Reviewed - No data to display   EKG     RADIOLOGY     PROCEDURES:  Critical Care performed:   Procedures   MEDICATIONS ORDERED IN ED: Medications - No data to display   IMPRESSION / MDM / ASSESSMENT AND PLAN / ED COURSE  I reviewed the triage vital signs and the  nursing notes.   Differential diagnosis includes, but is not limited to, pulpitis, dental caries, dental decay.  Patient is awake and alert, hemodynamically stable and afebrile.  Patient was evaluated in the emergency department for dental pain. Patient has tenderness over 1 of her teeth and poor dentition, I suspect some dental caries vs pulpitits. No gingival swelling or fluctuance concerning for gingival abscess.  No trismus, nuchal rigidity, neck pain, hot potato voice, uvular deviation or malocclusion to suggest deep space infection. No sublingual swelling concerning for Ludwig's angina.  Patient was started on antibiotics and chlorhexidine  mouth rinse.  She was given clindamycin given her penicillin allergy.  Patient was treated symptomatically in the emergency department. Discussed care plan, return precautions, and advised close outpatient follow-up with dentist. Patient agrees with plan of care.   Patient's presentation is most consistent with acute complicated illness / injury requiring diagnostic workup.   FINAL CLINICAL IMPRESSION(S) / ED DIAGNOSES   Final diagnoses:  Pain due to dental caries  Dental infection     Rx / DC Orders   ED Discharge Orders          Ordered    clindamycin (CLEOCIN) 300 MG capsule  3 times daily        04/18/24 1028    chlorhexidine  (PERIDEX ) 0.12 % solution  2 times daily        04/18/24 1029             Note:  This document was prepared using Dragon voice recognition software and may include unintentional dictation errors.   Orabelle Rylee E, PA-C 04/18/24 1054    Suzanne Kirsch, MD 04/18/24 1536

## 2024-07-19 ENCOUNTER — Other Ambulatory Visit (HOSPITAL_COMMUNITY): Payer: Self-pay

## 2024-07-19 ENCOUNTER — Telehealth: Payer: Self-pay | Admitting: Pharmacy Technician

## 2024-07-19 NOTE — Telephone Encounter (Signed)
 Pharmacy Patient Advocate Encounter   Received notification from [employer] that prior authorization for [employer]  is due for renewal.   Insurance verification completed.   The patient is insured through [facility].  Action: PA required; PA submitted to above mentioned insurance via Latent Key/confirmation #/[id number] Status is pending

## 2024-07-19 NOTE — Telephone Encounter (Signed)
 Pharmacy Patient Advocate Encounter  Received notification from [employer] that Prior Authorization for [employer]   has been DENIED.  Full denial letter will be uploaded to the media tab. See denial reason below.   PA #/Case ID/Reference #: [id number]

## 2024-08-23 ENCOUNTER — Encounter: Payer: 59 | Admitting: Family Medicine

## 2024-09-22 ENCOUNTER — Encounter: Admitting: Family Medicine
